# Patient Record
Sex: Male | Born: 1940 | Race: White | Hispanic: No | Marital: Married | Smoking: Former smoker
Health system: Southern US, Community
[De-identification: ages and names within clinical notes are randomized; demographics above are authoritative.]

## PROBLEM LIST (undated history)

## (undated) DIAGNOSIS — N529 Male erectile dysfunction, unspecified: Secondary | ICD-10-CM

## (undated) DIAGNOSIS — C3492 Malignant neoplasm of unspecified part of left bronchus or lung: Secondary | ICD-10-CM

## (undated) DIAGNOSIS — M8580 Other specified disorders of bone density and structure, unspecified site: Secondary | ICD-10-CM

## (undated) DIAGNOSIS — N390 Urinary tract infection, site not specified: Secondary | ICD-10-CM

## (undated) DIAGNOSIS — I10 Essential (primary) hypertension: Secondary | ICD-10-CM

## (undated) DIAGNOSIS — D513 Other dietary vitamin B12 deficiency anemia: Secondary | ICD-10-CM

## (undated) DIAGNOSIS — K219 Gastro-esophageal reflux disease without esophagitis: Secondary | ICD-10-CM

## (undated) DIAGNOSIS — E785 Hyperlipidemia, unspecified: Secondary | ICD-10-CM

## (undated) DIAGNOSIS — E875 Hyperkalemia: Secondary | ICD-10-CM

## (undated) DIAGNOSIS — E559 Vitamin D deficiency, unspecified: Secondary | ICD-10-CM

## (undated) DIAGNOSIS — C61 Malignant neoplasm of prostate: Secondary | ICD-10-CM

## (undated) HISTORY — DX: Malignant neoplasm of unspecified part of left bronchus or lung: C34.92

---

## 1999-04-24 ENCOUNTER — Encounter: Admission: RE | Admit: 1999-04-24 | Discharge: 1999-07-23 | Payer: Self-pay | Admitting: Radiation Oncology

## 1999-07-04 ENCOUNTER — Encounter: Payer: Self-pay | Admitting: Urology

## 1999-07-04 ENCOUNTER — Ambulatory Visit (HOSPITAL_BASED_OUTPATIENT_CLINIC_OR_DEPARTMENT_OTHER): Admission: RE | Admit: 1999-07-04 | Discharge: 1999-07-04 | Payer: Self-pay | Admitting: Urology

## 1999-07-25 ENCOUNTER — Encounter: Payer: Self-pay | Admitting: Radiation Oncology

## 1999-07-25 ENCOUNTER — Encounter: Admission: RE | Admit: 1999-07-25 | Discharge: 1999-10-23 | Payer: Self-pay | Admitting: Radiation Oncology

## 2008-11-23 ENCOUNTER — Encounter: Admission: RE | Admit: 2008-11-23 | Discharge: 2008-12-22 | Payer: Self-pay | Admitting: Physician Assistant

## 2010-05-30 ENCOUNTER — Emergency Department (HOSPITAL_COMMUNITY)
Admission: EM | Admit: 2010-05-30 | Discharge: 2010-05-30 | Disposition: A | Discharge status: Home / Self Care | Payer: Medicare Other | Attending: Emergency Medicine | Admitting: Emergency Medicine

## 2010-05-30 ENCOUNTER — Emergency Department (HOSPITAL_COMMUNITY): Payer: Medicare Other

## 2010-05-30 DIAGNOSIS — I10 Essential (primary) hypertension: Secondary | ICD-10-CM | POA: Insufficient documentation

## 2010-05-30 DIAGNOSIS — M545 Low back pain, unspecified: Secondary | ICD-10-CM | POA: Insufficient documentation

## 2010-05-30 DIAGNOSIS — M25529 Pain in unspecified elbow: Secondary | ICD-10-CM | POA: Insufficient documentation

## 2010-05-30 DIAGNOSIS — S8000XA Contusion of unspecified knee, initial encounter: Secondary | ICD-10-CM | POA: Insufficient documentation

## 2010-05-30 DIAGNOSIS — I2510 Atherosclerotic heart disease of native coronary artery without angina pectoris: Secondary | ICD-10-CM | POA: Insufficient documentation

## 2010-05-30 DIAGNOSIS — M25469 Effusion, unspecified knee: Secondary | ICD-10-CM | POA: Insufficient documentation

## 2010-05-30 DIAGNOSIS — R110 Nausea: Secondary | ICD-10-CM | POA: Insufficient documentation

## 2010-05-30 DIAGNOSIS — Z79899 Other long term (current) drug therapy: Secondary | ICD-10-CM | POA: Insufficient documentation

## 2010-05-30 DIAGNOSIS — M25569 Pain in unspecified knee: Secondary | ICD-10-CM | POA: Insufficient documentation

## 2010-05-30 DIAGNOSIS — M25429 Effusion, unspecified elbow: Secondary | ICD-10-CM | POA: Insufficient documentation

## 2010-05-30 DIAGNOSIS — M79609 Pain in unspecified limb: Secondary | ICD-10-CM | POA: Insufficient documentation

## 2010-05-30 DIAGNOSIS — Y990 Civilian activity done for income or pay: Secondary | ICD-10-CM | POA: Insufficient documentation

## 2010-05-30 DIAGNOSIS — IMO0002 Reserved for concepts with insufficient information to code with codable children: Secondary | ICD-10-CM | POA: Insufficient documentation

## 2010-05-30 DIAGNOSIS — R0602 Shortness of breath: Secondary | ICD-10-CM | POA: Insufficient documentation

## 2010-05-30 DIAGNOSIS — W11XXXA Fall on and from ladder, initial encounter: Secondary | ICD-10-CM | POA: Insufficient documentation

## 2010-05-30 DIAGNOSIS — S5000XA Contusion of unspecified elbow, initial encounter: Secondary | ICD-10-CM | POA: Insufficient documentation

## 2010-05-30 LAB — URINALYSIS, ROUTINE W REFLEX MICROSCOPIC
Bilirubin Urine: NEGATIVE
Nitrite: NEGATIVE
Specific Gravity, Urine: 1.022 (ref 1.005–1.030)
Urobilinogen, UA: 1 mg/dL (ref 0.0–1.0)
pH: 7 (ref 5.0–8.0)

## 2010-06-23 NOTE — Op Note (Signed)
[facility]  Patient:    Jeremy Chan, Jeremy Chan                       MRN: [id number] Proc. Date: 07/04/99 Adm. Date:  [id number] Disc. Date: [id number] Attending:  Nelma Rothman Iii                           Operative Report  PREOPERATIVE DIAGNOSIS:  Adenocarcinoma of the prostate.  POSTOPERATIVE DIAGNOSIS:  Adenocarcinoma of the prostate.  OPERATION PERFORMED:  Transperineal implantation of iodine-125 seeds into the prostate and flexible cystourethroscopy.  SURGEON:  Lucrezia Starch. Ovidio Hanger, M.D.  ASSISTANT:  Wynn Banker, M.D.  ANESTHESIA:  General endotracheal.  ESTIMATED BLOOD LOSS:  15 cc.  TUBES:  16 French Foley catheter.  COMPLICATIONS:  None.  A total of 96 seeds were implanted with 24 needles at 0.429 mCi per seed. Vicryl strands were utilized.  INDICATIONS FOR PROCEDURE:  The patient is a very nice 70 year old white male who presented to Dr. Sherril Croon and was subsequently to have a PSA of 4.61.  He subsequently underwent transrectal ultrasound and biopsy of the prostate which revealed a Gleason score 5 which was 2+3 adenocarcinoma and several areas of high grade TIN.  He has considered all options carefully. He has been properly informed and properly simulated and has elected to proceed with seed implantation.  DESCRIPTION OF PROCEDURE:  The patient was placed in supine position.  After proper general endotracheal anesthesia, he was placed in the dorsal lithotomy position and prepped and draped with Betadine in sterile fashion.  A 16 French Foley catheter was inserted and inflated with 10 cc of contrast solution. Fluoroscopy was placed as was the transrectal ultrasound probe, the Siemens device on the [place] stabilization device.  The wound was draped and holding needles were placed with the physical grid utilizing the electronic grid on the ultrasound to preplanned coordinates.  Proper measurements were taken and serial  implantation of the prostate was performed with Vicryl strands in preplanned coordinates utilizing both ultrasound and fluoroscopic localization.  A total of 96 seeds were implanted with 24 needles at 0.429 mCi per seed and Vicryl strands were utilized.  Following implantation we were comfortable with the localization both with ultrasound and with fluoroscopy. Following this, the holding needles were removed as was the transrectal ultrasound probe.  Static images were obtained fluoroscopically with and without the ultrasound probe in place.  The wound was then dressed sterilely. It should be noted that a 14 French red rubber catheter had been placed into the rectum to vent flatus and this was removed also.  The patient was placed in the supine position and the Foley catheter was removed and scanned for seeds and there were none within it.  Flexible cystourethroscopy was then performed with an Olympus flexible cystourethroscope.  He was noted to have mild trilobar hypertrophy.  Grade 1 trabeculation was noted.  Efflux of clear urine from the normally placed ureteral orifices was noted bilaterally and there were no seeds or Vicryl strands within the bladder or urethra.  The flexible cystourethroscope was visually removed.  A new 16 French catheter was inserted and inflated and the patient was taken to the recovery room stable. DD:  07/04/99 TD:  07/06/99 Job: [id number]

## 2016-07-31 ENCOUNTER — Emergency Department (HOSPITAL_COMMUNITY): Payer: Medicare Other

## 2016-07-31 ENCOUNTER — Encounter (HOSPITAL_COMMUNITY): Payer: Self-pay

## 2016-07-31 ENCOUNTER — Inpatient Hospital Stay (HOSPITAL_COMMUNITY)
Admission: EM | Admit: 2016-07-31 | Discharge: 2016-08-10 | DRG: 166 | Disposition: A | Discharge status: Home Health | Payer: Medicare Other | Attending: Internal Medicine | Admitting: Internal Medicine

## 2016-07-31 ENCOUNTER — Observation Stay (HOSPITAL_COMMUNITY): Payer: Medicare Other

## 2016-07-31 DIAGNOSIS — R079 Chest pain, unspecified: Secondary | ICD-10-CM | POA: Diagnosis not present

## 2016-07-31 DIAGNOSIS — J90 Pleural effusion, not elsewhere classified: Secondary | ICD-10-CM | POA: Diagnosis not present

## 2016-07-31 DIAGNOSIS — J939 Pneumothorax, unspecified: Secondary | ICD-10-CM | POA: Diagnosis not present

## 2016-07-31 DIAGNOSIS — Z8546 Personal history of malignant neoplasm of prostate: Secondary | ICD-10-CM

## 2016-07-31 DIAGNOSIS — Z9889 Other specified postprocedural states: Secondary | ICD-10-CM

## 2016-07-31 DIAGNOSIS — I2510 Atherosclerotic heart disease of native coronary artery without angina pectoris: Secondary | ICD-10-CM | POA: Diagnosis present

## 2016-07-31 DIAGNOSIS — Z8042 Family history of malignant neoplasm of prostate: Secondary | ICD-10-CM

## 2016-07-31 DIAGNOSIS — J910 Malignant pleural effusion: Secondary | ICD-10-CM | POA: Diagnosis present

## 2016-07-31 DIAGNOSIS — K5900 Constipation, unspecified: Secondary | ICD-10-CM | POA: Diagnosis not present

## 2016-07-31 DIAGNOSIS — J189 Pneumonia, unspecified organism: Secondary | ICD-10-CM | POA: Diagnosis present

## 2016-07-31 DIAGNOSIS — D62 Acute posthemorrhagic anemia: Secondary | ICD-10-CM | POA: Diagnosis not present

## 2016-07-31 DIAGNOSIS — E7800 Pure hypercholesterolemia, unspecified: Secondary | ICD-10-CM

## 2016-07-31 DIAGNOSIS — G4733 Obstructive sleep apnea (adult) (pediatric): Secondary | ICD-10-CM | POA: Diagnosis present

## 2016-07-31 DIAGNOSIS — K219 Gastro-esophageal reflux disease without esophagitis: Secondary | ICD-10-CM | POA: Diagnosis present

## 2016-07-31 DIAGNOSIS — J949 Pleural condition, unspecified: Secondary | ICD-10-CM

## 2016-07-31 DIAGNOSIS — I10 Essential (primary) hypertension: Secondary | ICD-10-CM | POA: Diagnosis present

## 2016-07-31 DIAGNOSIS — Z87891 Personal history of nicotine dependence: Secondary | ICD-10-CM

## 2016-07-31 DIAGNOSIS — Z9689 Presence of other specified functional implants: Secondary | ICD-10-CM

## 2016-07-31 DIAGNOSIS — Z955 Presence of coronary angioplasty implant and graft: Secondary | ICD-10-CM

## 2016-07-31 DIAGNOSIS — C3490 Malignant neoplasm of unspecified part of unspecified bronchus or lung: Principal | ICD-10-CM | POA: Diagnosis present

## 2016-07-31 DIAGNOSIS — E785 Hyperlipidemia, unspecified: Secondary | ICD-10-CM | POA: Diagnosis present

## 2016-07-31 DIAGNOSIS — R52 Pain, unspecified: Secondary | ICD-10-CM

## 2016-07-31 HISTORY — DX: Male erectile dysfunction, unspecified: N52.9

## 2016-07-31 HISTORY — DX: Other dietary vitamin B12 deficiency anemia: D51.3

## 2016-07-31 HISTORY — DX: Essential (primary) hypertension: I10

## 2016-07-31 HISTORY — DX: Urinary tract infection, site not specified: N39.0

## 2016-07-31 HISTORY — DX: Hyperkalemia: E87.5

## 2016-07-31 HISTORY — DX: Malignant neoplasm of prostate: C61

## 2016-07-31 HISTORY — DX: Hyperlipidemia, unspecified: E78.5

## 2016-07-31 HISTORY — DX: Gastro-esophageal reflux disease without esophagitis: K21.9

## 2016-07-31 HISTORY — DX: Other specified disorders of bone density and structure, unspecified site: M85.80

## 2016-07-31 HISTORY — DX: Vitamin D deficiency, unspecified: E55.9

## 2016-07-31 LAB — BASIC METABOLIC PANEL
ANION GAP: 9 (ref 5–15)
BUN: 18 mg/dL (ref 6–20)
CALCIUM: 9.2 mg/dL (ref 8.9–10.3)
CO2: 25 mmol/L (ref 22–32)
CREATININE: 0.82 mg/dL (ref 0.61–1.24)
Chloride: 100 mmol/L — ABNORMAL LOW (ref 101–111)
Glucose, Bld: 135 mg/dL — ABNORMAL HIGH (ref 65–99)
Potassium: 3.7 mmol/L (ref 3.5–5.1)
SODIUM: 134 mmol/L — AB (ref 135–145)

## 2016-07-31 LAB — DIFFERENTIAL
BASOS PCT: 0 %
Basophils Absolute: 0 10*3/uL (ref 0.0–0.1)
EOS PCT: 1 %
Eosinophils Absolute: 0.2 10*3/uL (ref 0.0–0.7)
LYMPHS PCT: 16 %
Lymphs Abs: 1.8 10*3/uL (ref 0.7–4.0)
MONO ABS: 1 10*3/uL (ref 0.1–1.0)
Monocytes Relative: 9 %
NEUTROS PCT: 74 %
Neutro Abs: 8.3 10*3/uL — ABNORMAL HIGH (ref 1.7–7.7)

## 2016-07-31 LAB — HEPATIC FUNCTION PANEL
ALT: 12 U/L — ABNORMAL LOW (ref 17–63)
AST: 18 U/L (ref 15–41)
Albumin: 3.8 g/dL (ref 3.5–5.0)
Alkaline Phosphatase: 83 U/L (ref 38–126)
BILIRUBIN DIRECT: 0.2 mg/dL (ref 0.1–0.5)
BILIRUBIN INDIRECT: 0.6 mg/dL (ref 0.3–0.9)
TOTAL PROTEIN: 6.6 g/dL (ref 6.5–8.1)
Total Bilirubin: 0.8 mg/dL (ref 0.3–1.2)

## 2016-07-31 LAB — CBC
HCT: 43.9 % (ref 39.0–52.0)
HEMOGLOBIN: 14.6 g/dL (ref 13.0–17.0)
MCH: 31.6 pg (ref 26.0–34.0)
MCHC: 33.3 g/dL (ref 30.0–36.0)
MCV: 95 fL (ref 78.0–100.0)
PLATELETS: 271 10*3/uL (ref 150–400)
RBC: 4.62 MIL/uL (ref 4.22–5.81)
RDW: 13.7 % (ref 11.5–15.5)
WBC: 12.1 10*3/uL — ABNORMAL HIGH (ref 4.0–10.5)

## 2016-07-31 LAB — I-STAT TROPONIN, ED: TROPONIN I, POC: 0 ng/mL (ref 0.00–0.08)

## 2016-07-31 LAB — PROTIME-INR
INR: 1
Prothrombin Time: 13.2 seconds (ref 11.4–15.2)

## 2016-07-31 MED ORDER — SODIUM CHLORIDE 0.9% FLUSH
3.0000 mL | Freq: Two times a day (BID) | INTRAVENOUS | Status: DC
  Administered 2016-08-01 – 2016-08-10 (×13): 3 mL via INTRAVENOUS

## 2016-07-31 MED ORDER — ONDANSETRON HCL 4 MG PO TABS: 4.0000 mg | ORAL_TABLET | Freq: Four times a day (QID) | ORAL | Status: DC | PRN

## 2016-07-31 MED ORDER — GUAIFENESIN ER 600 MG PO TB12
600.0000 mg | ORAL_TABLET | Freq: Two times a day (BID) | ORAL | Status: DC
  Administered 2016-08-01 – 2016-08-10 (×19): 600 mg via ORAL
  Filled 2016-07-31 (×19): qty 1

## 2016-07-31 MED ORDER — ONDANSETRON HCL 4 MG/2ML IJ SOLN: 4.0000 mg | Freq: Four times a day (QID) | INTRAMUSCULAR | Status: DC | PRN

## 2016-07-31 MED ORDER — BENAZEPRIL HCL 20 MG PO TABS
20.0000 mg | ORAL_TABLET | Freq: Every day | ORAL | Status: DC
  Administered 2016-08-01 – 2016-08-10 (×10): 20 mg via ORAL
  Filled 2016-07-31 (×3): qty 1
  Filled 2016-07-31 (×5): qty 4
  Filled 2016-07-31: qty 1
  Filled 2016-07-31: qty 4
  Filled 2016-07-31: qty 1

## 2016-07-31 MED ORDER — HYDROCODONE-ACETAMINOPHEN 5-325 MG PO TABS
1.0000 | ORAL_TABLET | ORAL | Status: DC | PRN
  Administered 2016-08-01 – 2016-08-02 (×3): 2 via ORAL
  Administered 2016-08-03: 1 via ORAL
  Administered 2016-08-04 – 2016-08-05 (×2): 2 via ORAL
  Administered 2016-08-09: 1 via ORAL
  Administered 2016-08-10: 2 via ORAL
  Filled 2016-07-31 (×8): qty 2

## 2016-07-31 MED ORDER — SODIUM CHLORIDE 0.9% FLUSH: 3.0000 mL | INTRAVENOUS | Status: DC | PRN

## 2016-07-31 MED ORDER — ATORVASTATIN CALCIUM 40 MG PO TABS
40.0000 mg | ORAL_TABLET | Freq: Every day | ORAL | Status: DC
  Administered 2016-08-01 – 2016-08-09 (×8): 40 mg via ORAL
  Filled 2016-07-31 (×8): qty 1

## 2016-07-31 MED ORDER — ALBUTEROL SULFATE (2.5 MG/3ML) 0.083% IN NEBU: 2.5000 mg | INHALATION_SOLUTION | RESPIRATORY_TRACT | Status: DC | PRN

## 2016-07-31 MED ORDER — AMLODIPINE BESYLATE 5 MG PO TABS
5.0000 mg | ORAL_TABLET | Freq: Every day | ORAL | Status: DC
  Administered 2016-08-01 – 2016-08-10 (×10): 5 mg via ORAL
  Filled 2016-07-31 (×10): qty 1

## 2016-07-31 MED ORDER — ACETAMINOPHEN 650 MG RE SUPP: 650.0000 mg | Freq: Four times a day (QID) | RECTAL | Status: DC | PRN

## 2016-07-31 MED ORDER — SODIUM CHLORIDE 0.9 % IV SOLN: 250.0000 mL | INTRAVENOUS | Status: DC | PRN

## 2016-07-31 MED ORDER — SODIUM CHLORIDE 0.9% FLUSH
3.0000 mL | Freq: Two times a day (BID) | INTRAVENOUS | Status: DC
  Administered 2016-08-01 – 2016-08-09 (×11): 3 mL via INTRAVENOUS

## 2016-07-31 MED ORDER — ACETAMINOPHEN 325 MG PO TABS
650.0000 mg | ORAL_TABLET | Freq: Four times a day (QID) | ORAL | Status: DC | PRN
  Filled 2016-07-31 (×2): qty 2

## 2016-07-31 NOTE — ED Provider Notes (Signed)
[facility] DEPT Provider Note   CSN: [id number] Arrival date & time: 07/31/16  1805     History   Chief Complaint Chief Complaint  Patient presents with  . Chest Pain    HPI Jeremy Chan is a 76 y.o. male.  HPI Patient presents with left-sided chest pain and some shortness of breath. Sent from outside hospital for 75% pleural effusion on the left side. Has had shortness of breath over the last few months. Has a chest pain for last 2 days. Sharp on the left chest. Described as a heaviness. No fevers. Has had some sinus congestion. States his been some drainage. No real cough. States he is not bringing anything up. No fevers. Previous history of prostate cancer states he has prostate cancer free now. He does not smoke. No unexplained weight loss. He has a house at [employer]. He states his primary care doctor is there. States he however [place]    Past Medical History:  Diagnosis Date  . Age-related bone loss   . Erectile dysfunction   . GERD (gastroesophageal reflux disease)   . Hyperkalemia   . Hyperlipidemia   . Hypertension   . Other dietary vitamin B12 deficiency anemia   . Prostate cancer ([place])   . Urinary tract infection   . Vitamin D deficiency     Patient Active Problem List   Diagnosis Date Noted  . Pleural effusion 07/31/2016  . Hyperlipidemia 07/31/2016  . Essential hypertension 07/31/2016    History reviewed. No pertinent surgical history.     Home Medications    Prior to Admission medications   Medication Sig Start Date End Date Taking? Authorizing Provider  amLODipine-benazepril (LOTREL) 5-20 MG capsule Take 1 capsule by mouth daily.   Yes [provider]  aspirin 81 MG chewable tablet Chew 81 mg by mouth daily.   Yes [provider]  atorvastatin (LIPITOR) 40 MG tablet Take 40 mg by mouth daily.   Yes [provider]  ibuprofen  (ADVIL,MOTRIN) 200 MG tablet Take 800 mg by mouth every 6 (six) hours as needed.   Yes [provider]    Family History Family History  Problem Relation Age of Onset  . Prostate cancer Father   . Prostate cancer Brother   . Diabetes Other   . CAD Neg Hx   . Stroke Neg Hx     Social History Social History  Substance Use Topics  . Smoking status: Former Smoker    Types: Cigarettes    Quit date: 30  . Smokeless tobacco: Never Used  . Alcohol use Yes     Allergies   Patient has no known allergies.   Review of Systems Review of Systems  Constitutional: Negative for appetite change and fever.  HENT: Negative for dental problem.   Respiratory: Positive for shortness of breath.   Cardiovascular: Positive for chest pain.  Gastrointestinal: Negative for abdominal pain.  Genitourinary: Negative for flank pain.  Musculoskeletal: Positive for back pain.  Neurological: Negative for numbness.  Hematological: Negative for adenopathy.  Psychiatric/Behavioral: Negative for confusion.     Physical Exam Updated Vital Signs BP (!) 120/91 (BP Location: Right Arm)   Pulse 87   Temp 98.3 F (36.8 C) (Oral)   Resp 18   Wt 99.3 kg (219 lb)   SpO2 99%   Physical Exam  Constitutional: He appears well-developed.  HENT:  Head: Atraumatic.  Eyes: Pupils are equal, round, and reactive to light.  Neck: Neck supple.  Cardiovascular: Normal rate.   Pulmonary/Chest: Effort normal.  No respiratory distress. May have decreased breath sounds on the left side.  Abdominal: There is no tenderness.  Musculoskeletal: He exhibits no edema.  Neurological: He is alert.  Skin: Skin is warm. Capillary refill takes less than 2 seconds.  Psychiatric: He has a normal mood and affect.     ED Treatments / Results  Labs (all labs ordered are listed, but only abnormal results are displayed) Labs Reviewed  BASIC METABOLIC PANEL - Abnormal; Notable for the following:       Result Value     Sodium 134 (*)    Chloride 100 (*)    Glucose, Bld 135 (*)    All other components within normal limits  CBC - Abnormal; Notable for the following:    WBC 12.1 (*)    All other components within normal limits  HEPATIC FUNCTION PANEL - Abnormal; Notable for the following:    ALT 12 (*)    All other components within normal limits  DIFFERENTIAL - Abnormal; Notable for the following:    Neutro Abs 8.3 (*)    All other components within normal limits  PROTIME-INR  CBC WITH DIFFERENTIAL/PLATELET  MAGNESIUM  PHOSPHORUS  TSH  COMPREHENSIVE METABOLIC PANEL  CBC  TROPONIN I  TROPONIN I  TROPONIN I  I-STAT TROPOININ, ED    EKG  EKG Interpretation  Date/Time:  Tuesday July 31 2016 18:08:57 EDT Ventricular Rate:  93 PR Interval:  152 QRS Duration: 72 QT Interval:  370 QTC Calculation: 460 R Axis:   84 Text Interpretation:  Normal sinus rhythm Low voltage QRS Nonspecific ST abnormality Abnormal ECG Confirmed by Alvino Chapel  MD, Ovid Curd [phone]) on 07/31/2016 7:41:48 PM       Radiology Dg Chest 2 View  Result Date: 07/31/2016 CLINICAL DATA:  76 year old with 2-3 day history of chest pain, shortness of breath and cough. EXAM: CHEST  2 VIEW COMPARISON:  05/30/2010. FINDINGS: Very large left pleural effusion with residual aeration of only the left lung apex. The large effusion obscures the cardiac silhouette. Right lung clear. No right pleural effusion. Degenerative changes and DISH involving the thoracic spine. IMPRESSION: Very large left pleural effusion and associated passive atelectasis involving the left lung with only the left apex having residual aeration. Right lung clear. Electronically Signed   By: Evangeline Dakin M.D.   On: 07/31/2016 18:49   Dg Chest Right Decubitus  Result Date: 07/31/2016 CLINICAL DATA:  Shortness of breath EXAM: CHEST - RIGHT DECUBITUS COMPARISON:  07/31/2016 FINDINGS: Right side down decubitus view. Large left pleural effusion with small amount of  aerated lung at the left apex, fluid appears mobile given change in location of the aerated lung. Unable to comment on cardiomediastinal silhouette as it is obscured. Fluid level in the left upper quadrant is probably in the stomach IMPRESSION: Large left pleural effusion with minimal aeration of the left lung apex. Electronically Signed   By: Donavan Foil M.D.   On: 07/31/2016 21:54    Procedures Procedures (including critical care time)  Medications Ordered in ED Medications  atorvastatin (LIPITOR) tablet 40 mg (not administered)  amLODipine (NORVASC) tablet 5 mg (not administered)  benazepril (LOTENSIN) tablet 20 mg (not administered)  sodium chloride flush (NS) 0.9 % injection 3 mL (not administered)  acetaminophen (TYLENOL) tablet 650 mg (not administered)    Or  acetaminophen (TYLENOL) suppository 650 mg (not  administered)  HYDROcodone-acetaminophen (NORCO/VICODIN) 5-325 MG per tablet 1-2 tablet (not administered)  ondansetron (ZOFRAN) tablet 4 mg (not administered)    Or  ondansetron (ZOFRAN) injection 4 mg (not administered)  sodium chloride flush (NS) 0.9 % injection 3 mL (not administered)  sodium chloride flush (NS) 0.9 % injection 3 mL (not administered)  0.9 %  sodium chloride infusion (not administered)  albuterol (PROVENTIL) (2.5 MG/3ML) 0.083% nebulizer solution 2.5 mg (not administered)  guaiFENesin (MUCINEX) 12 hr tablet 600 mg (not administered)     Initial Impression / Assessment and Plan / ED Course  I have reviewed the triage vital signs and the nursing notes.  Pertinent labs & imaging results that were available during my care of the patient were reviewed by me and considered in my medical decision making (see chart for details).     Patient with left-sided pleural effusion. Well-appearing. Reportedly did have outside CT scan. Does not follow up in this area. Will admit.  Final Clinical Impressions(s) / ED Diagnoses   Final diagnoses:  Pleural effusion     New Prescriptions Current Discharge Medication List       Davonna Belling, MD 07/31/16 2320

## 2016-07-31 NOTE — ED Notes (Signed)
Pt back from xray. No distress observed

## 2016-07-31 NOTE — ED Triage Notes (Signed)
Pt was sent from [facility] for large left pleural fluid collection filling 75% of the left hemithorax. They recommend a CT scan of the chest. He reports non-radiating chest pain described as heaviness associated with SOB.

## 2016-07-31 NOTE — ED Notes (Signed)
Magda Paganini, ED secretary attempted to call [facility] in [place]. Facility closed. No after-hours phone number provided. Only option provided is to leave a message on provider voicemail.

## 2016-07-31 NOTE — ED Notes (Signed)
[facility] closes at 5 pm.  No after hours number.  Medical Records will have to be obtained during business hours 8 am-5 pm.  The phone number is [phone]

## 2016-07-31 NOTE — ED Notes (Signed)
MD at bedside. 

## 2016-07-31 NOTE — ED Notes (Signed)
Patient transported to X-ray 

## 2016-07-31 NOTE — H&P (Addendum)
Jeremy Chan YFV:[id number] DOB: 1941-01-30 DOA: 07/31/2016     PCP: Haze Rushing, MD   Outpatient Specialists: routine Dermatology visits Patient coming from  home Lives  With family wife   Chief Complaint: large plural effusion  HPI: Jeremy Chan is a 76 y.o. male with medical history significant of OSA, prostate cancer, GERD, HTN, HLD, CAD    Presented with progressive Shortness of breath and occasional chest pain for the past few weeks denies weight loss no fevers or chills. He has been tired no energy, very tired all the time for few months. He reports some cough productive of clear mucus. Lately became dry cough. No hemoptysis.  For the past 1 hear he have had horseness. He was a very heavy smoker in 1980's.  [place] [facility]  Presented with chest pain for the past few days chest x-ray showed large pleural effusion on the left up to 75% he was sent to [facility] where his primary residence is to follow-up.  Patient states that he has received CTA chest at [facility] unfortunately no records of CT results were sent with the patient   Regarding pertinent Chronic problems: hx of Prostate cancer sp  Transperineal implantation of iodine-125 seeds into the prostate in 2012 CAD hx of heart stents done at [place] 15 years ago.   Last colonoscopy was 8 years ago he had some polyps removed.   IN ER:  Temp (24hrs), Avg:97.7 F (36.5 C), Min:97.7 F (36.5 C), Max:97.7 F (36.5 C)      on arrival  ED Triage Vitals  Enc Vitals Group     BP 07/31/16 1813 (!) 144/78     Pulse Rate 07/31/16 1813 90     Resp 07/31/16 1813 20     Temp 07/31/16 1813 97.7 F (36.5 C)     Temp Source 07/31/16 1813 Oral     SpO2 07/31/16 1813 97 %     Weight --      Height --      Head Circumference --      Peak Flow --      Pain Score 07/31/16 1808 7     Pain Loc --      Pain Edu? --      Excl. in [place]? --    RR 18 Hr 77 Bp 131/73 Trop  0.00 Na 134 K 3.7  AG 9 WBC 12.1 Hg 14.6  CXR showing large left plural effusion Following Medications were ordered in ER: Medications - No data to display     Hospitalist was called for admission for work up of symptomatic large left plural effusion  Review of Systems:    Pertinent positives include: fatigue,  shortness of breath at rest.  dyspnea on exertion, chest pain Constitutional:  No weight loss, night sweats, Fevers, chills, weight loss  HEENT:  No headaches, Difficulty swallowing,Tooth/dental problems,Sore throat,  No sneezing, itching, ear ache, nasal congestion, post nasal drip,  Cardio-vascular:  No , Orthopnea, PND, anasarca, dizziness, palpitations.no Bilateral lower extremity swelling  GI:  No heartburn, indigestion, abdominal pain, nausea, vomiting, diarrhea, change in bowel habits, loss of appetite, melena, blood in stool, hematemesis Resp:  no No, No excess mucus, no productive cough, No non-productive cough, No coughing up of blood.No change in color of mucus.No wheezing. Skin:  no rash or lesions. No jaundice GU:  no dysuria, change in color of urine, no urgency or  frequency. No straining to urinate.  No flank pain.  Musculoskeletal:  No joint pain or no joint swelling. No decreased range of motion. No back pain.  Psych:  No change in mood or affect. No depression or anxiety. No memory loss.  Neuro: no localizing neurological complaints, no tingling, no weakness, no double vision, no gait abnormality, no slurred speech, no confusion  As per HPI otherwise 10 point review of systems negative.   Past Medical History: Past Medical History:  Diagnosis Date  . Age-related bone loss   . Erectile dysfunction   . GERD (gastroesophageal reflux disease)   . Hyperkalemia   . Hyperlipidemia   . Hypertension   . Other dietary vitamin B12 deficiency anemia   . Prostate cancer ([place])   . Urinary tract infection   . Vitamin D deficiency    History reviewed.  No pertinent surgical history.   Social History:  Ambulatory   independently      reports that he quit smoking about 32 years ago. His smoking use included Cigarettes. He has never used smokeless tobacco. He reports that he drinks alcohol. He reports that he uses drugs, including Marijuana.  Allergies:  Not on File     Family History:   Family History  Problem Relation Age of Onset  . Prostate cancer Father   . Prostate cancer Brother   . Diabetes Other   . CAD Neg Hx   . Stroke Neg Hx     Medications: Prior to Admission medications   Not on File    Physical Exam: Patient Vitals for the past 24 hrs:  BP Temp Temp src Pulse Resp SpO2  07/31/16 2030 131/73 - - 77 18 -  07/31/16 2000 130/77 - - 80 17 -  07/31/16 1958 - - - - - 97 %  07/31/16 1945 (!) 150/86 - - 87 16 -  07/31/16 1943 (!) 156/83 - - 88 (!) 21 98 %  07/31/16 1813 (!) 144/78 97.7 F (36.5 C) Oral 90 20 97 %    1. General:  in No Acute distress 2. Psychological: Alert and   Oriented 3. Head/ENT:    Dry Mucous Membranes                          Head Non traumatic, neck supple                          Normal   Dentition 4. SKIN:   decreased Skin turgor,  Skin clean Dry and intact no rash 5. Heart: Regular rate and rhythm no Murmur, Rub or gallop 6. Lungs:   no wheezes some crackles  Diminished on the left 7. Abdomen: Soft  non-tender, Non distended 8. Lower extremities: no clubbing, cyanosis, or edema 9. Neurologically Grossly intact, moving all 4 extremities equally   10. MSK: Normal range of motion   body mass index is unknown because there is no height or weight on file.  Labs on Admission:   Labs on Admission: I have personally reviewed following labs and imaging studies  CBC:  Recent Labs Lab 07/31/16 1815  WBC 12.1*  HGB 14.6  HCT 43.9  MCV 95.0  PLT 865   Basic Metabolic Panel:  Recent Labs Lab 07/31/16 1815  NA 134*  K 3.7  CL 100*  CO2 25  GLUCOSE 135*  BUN 18    CREATININE 0.82  CALCIUM 9.2   GFR:  CrCl cannot be calculated (Unknown ideal weight.). Liver Function Tests: No results for input(s): AST, ALT, ALKPHOS, BILITOT, PROT, ALBUMIN in the last 168 hours. No results for input(s): LIPASE, AMYLASE in the last 168 hours. No results for input(s): AMMONIA in the last 168 hours. Coagulation Profile: No results for input(s): INR, PROTIME in the last 168 hours. Cardiac Enzymes: No results for input(s): CKTOTAL, CKMB, CKMBINDEX, TROPONINI in the last 168 hours. BNP (last 3 results) No results for input(s): PROBNP in the last 8760 hours. HbA1C: No results for input(s): HGBA1C in the last 72 hours. CBG: No results for input(s): GLUCAP in the last 168 hours. Lipid Profile: No results for input(s): CHOL, HDL, LDLCALC, TRIG, CHOLHDL, LDLDIRECT in the last 72 hours. Thyroid Function Tests: No results for input(s): TSH, T4TOTAL, FREET4, T3FREE, THYROIDAB in the last 72 hours. Anemia Panel: No results for input(s): VITAMINB12, FOLATE, FERRITIN, TIBC, IRON, RETICCTPCT in the last 72 hours. Urine analysis:  Sepsis Labs: @LABRCNTIP (procalcitonin:4,lacticidven:4) )No results found for this or any previous visit (from the past 240 hour(s)).    UA  not ordered  No results found for: HGBA1C  CrCl cannot be calculated (Unknown ideal weight.).  BNP (last 3 results) No results for input(s): PROBNP in the last 8760 hours.   ECG REPORT  Independently reviewed Rate: 93  Rhythm: NSR ST&T Change: Low voltage flat t waves [id number]  There were no vitals filed for this visit.   Cultures: No results found for: SDES, SPECREQUEST, CULT, REPTSTATUS   Radiological Exams on Admission: Dg Chest 2 View  Result Date: 07/31/2016 CLINICAL DATA:  76 year old with 2-3 day history of chest pain, shortness of breath and cough. EXAM: CHEST  2 VIEW COMPARISON:  05/30/2010. FINDINGS: Very large left pleural effusion with residual aeration of only the left lung apex. The  large effusion obscures the cardiac silhouette. Right lung clear. No right pleural effusion. Degenerative changes and DISH involving the thoracic spine. IMPRESSION: Very large left pleural effusion and associated passive atelectasis involving the left lung with only the left apex having residual aeration. Right lung clear. Electronically Signed   By: Evangeline Dakin M.D.   On: 07/31/2016 18:49    Chart has been reviewed    Assessment/Plan  76 y.o. male with medical history significant of OSA, prostate cancer, GERD, HTN, HLD  Admitted for work up of symptomatic large left plural effusion   Present on Admission: . Pleural effusion - discussed with pulmonology, we'll be able to obtain ultrasonic guided thoracentesis tomorrow. Obtain records from [facility]. Appreciate pulmonology consult . Hyperlipidemia - continue home medication . Essential hypertension - Continue Norvasc Benazapril Chest pain most likely secondary to pleural effusion and known history of coronary artery disease who cycle cardiac enzymes  Leukocytosis no evening so fever this morning by blood cell, was within normal limits we'll continue to follow. Hold off antibiotics until sample of pleural effusion obtained  unless patient becomes febrile.  Other plan as per orders.  DVT prophylaxis:  SCD    Code Status:  FULL CODE   as per patient    Family Communication:   Family   at  Bedside  plan of care was discussed with   Son  Disposition Plan:   To home once workup is complete and patient is stable  Consults called: pulmonology    Admission status: obs   Level of care    tele          I have spent a total of 75 min on this admission   extra time was spent to discuss case with consultants pulmonology and radiology  [place] 07/31/2016, 10:11 PM    [facility]  Pager [phone]   after 2 AM please page floor coverage PA If 7AM-7PM,  please contact the day team taking care of the patient  [email or web address]  Password [email or web address]

## 2016-07-31 NOTE — ED Notes (Signed)
Hospitalist at bedside 

## 2016-07-31 NOTE — Consult Note (Signed)
PULMONARY / CRITICAL CARE MEDICINE   Name: Jeremy Chan MRN: [id number] DOB: Jun 01, 1940    ADMISSION DATE:  07/31/2016 CONSULTATION DATE:  07/31/16  REFERRING MD:Dr Doutova  CHIEF COMPLAINT: Pleural effusion  HISTORY OF PRESENT ILLNESS:   76yoM with history of Prostate cancer, HTN, Anemia, GERD, Hyperlipidemia, and Prior tobacco abuse, presents to the ER tonight c/o SOB and CP. He reports the SOB started several months ago, possibly as many as 6 months ago, but definitely less than 1 year. He reports is has been gradually progressing in severity to the point that now he has SOB with minimal exertion such as walking from his car to his house. He reports chronic cough for the past several months that he thought was related to his rhinorrhea and post-nasal drip, but he says that more recently the coughing worsened in severity but was still nonproductive. He now also c/o left-sided CP over his left breast that began 3 days ago and is "severe sharp" radiating through to his back. He was unable to quantify the pain severity numerically, just stating "severe." He denies F/C, Sick contacts, Weight loss, Falls/Trauma. 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 He denies being on any blood thinners.   PAST MEDICAL HISTORY :  He  has a past medical history of Age-related bone loss; Erectile dysfunction; GERD (gastroesophageal reflux disease); Hyperkalemia; Hyperlipidemia; Hypertension; Other dietary vitamin B12 deficiency anemia; Prostate cancer ([place]); Urinary tract infection; and Vitamin D deficiency.  PAST SURGICAL HISTORY: He  has no past  surgical history on file.  No Known Allergies  No current facility-administered medications on file prior to encounter.    No current outpatient prescriptions on file prior to encounter.    FAMILY HISTORY:  His indicated that his mother is deceased. He indicated that his father is deceased. He indicated that the status of his brother is unknown. He indicated that the status of his neg hx is unknown. He indicated that the status of his other is unknown.   SOCIAL HISTORY: He  reports that he quit smoking about 32 years ago. His smoking use included Cigarettes. He has never used smokeless tobacco. He reports that he drinks alcohol. He reports that he uses drugs, including Marijuana.  REVIEW OF SYSTEMS:   Review of Systems  Constitutional: Positive for malaise/fatigue.  HENT: Negative.   Eyes: Negative.   Respiratory: Positive for cough and shortness of breath.   Cardiovascular: Positive for chest pain.  Gastrointestinal: Negative.   Genitourinary: Negative.   Musculoskeletal: Negative.   Skin: Negative.   Neurological: Negative.   Endo/Heme/Allergies: Negative.   Psychiatric/Behavioral: Negative.   All other systems reviewed and are negative.   VITAL SIGNS: BP 136/78   Pulse 74   Temp 97.7 F (36.5 C) (Oral)   Resp 14   SpO2 96%      INTAKE / OUTPUT: No intake/output data recorded.  PHYSICAL EXAMINATION: General:  WDWN Elderly WM in NAD, on RA Neuro: AAOx3, moving all extremities. HEENT: PERRL, EOMI, MM moist, OP clear Cardiovascular: RRR, no m/r/g Lungs: Severely decreased breath sounds on left; CTA on right. Speaking in full sentences with no  accessory muscle use or respiratory distress Abdomen: Soft obese NTND Musculoskeletal: no clubbing, cyanosis, or edema Skin: no rashes  LABS:  BMET  Recent Labs Lab 07/31/16 1815  NA 134*  K 3.7  CL 100*  CO2 25  BUN 18  CREATININE 0.82  GLUCOSE 135*    Electrolytes  Recent Labs Lab 07/31/16 1815   CALCIUM 9.2    CBC  Recent Labs Lab 07/31/16 1815  WBC 12.1*  HGB 14.6  HCT 43.9  PLT 271    Coag's  Recent Labs Lab 07/31/16 2122  INR 1.00    Sepsis Markers No results for input(s): LATICACIDVEN, PROCALCITON, O2SATVEN in the last 168 hours.  ABG No results for input(s): PHART, PCO2ART, PO2ART in the last 168 hours.  Liver Enzymes  Recent Labs Lab 07/31/16 1815  AST 18  ALT 12*  ALKPHOS 83  BILITOT 0.8  ALBUMIN 3.8    Cardiac Enzymes No results for input(s): TROPONINI, PROBNP in the last 168 hours.  Glucose No results for input(s): GLUCAP in the last 168 hours.  Imaging Dg Chest 2 View  Result Date: 07/31/2016 CLINICAL DATA:  76 year old with 2-3 day history of chest pain, shortness of breath and cough. EXAM: CHEST  2 VIEW COMPARISON:  05/30/2010. FINDINGS: Very large left pleural effusion with residual aeration of only the left lung apex. The large effusion obscures the cardiac silhouette. Right lung clear. No right pleural effusion. Degenerative changes and DISH involving the thoracic spine. IMPRESSION: Very large left pleural effusion and associated passive atelectasis involving the left lung with only the left apex having residual aeration. Right lung clear. Electronically Signed   By: Evangeline Dakin M.D.   On: 07/31/2016 18:49   Dg Chest Right Decubitus  Result Date: 07/31/2016 CLINICAL DATA:  Shortness of breath EXAM: CHEST - RIGHT DECUBITUS COMPARISON:  07/31/2016 FINDINGS: Right side down decubitus view. Large left pleural effusion with small amount of aerated lung at the left apex, fluid appears mobile given change in location of the aerated lung. Unable to comment on cardiomediastinal silhouette as it is obscured. Fluid level in the left upper quadrant is probably in the stomach IMPRESSION: Large left pleural effusion with minimal aeration of the left lung apex. Electronically Signed   By: Donavan Foil M.D.   On: 07/31/2016 21:54    LINES/TUBES: PIV's   ASSESSMENT / PLAN: 76yoM with history of Prostate cancer, HTN, Anemia, GERD, Hyperlipidemia, and Prior tobacco abuse, presents to the ER tonight c/o SOB, Fatigue, Nonproductive cough for the past several months, and now Left-sided CP for the past 3 days, found to have large left-sided pleural effusion.      PULMONARY 1. Large Left-sided Pleural effusion: - on my review of his CXR, there is a large left-sided pleural effusion occupying approximately 90% of the left hemi-thorax.  - patient also reports he had a CTA chest approx 5 weeks ago at a different facility, but I do not have those images for review at this time. - The sub-acute presentation of his SOB and Fatigue is concerning that this effusion may be malignant in nature. Less likely infectious. Much less likely due to renal insufficiency or CHF given its unilateral nature. Unlikely hemothorax as he is not on any anticoagulation and denies any history of trauma. - will plan on thoracentesis tonight with labs to be sent to micro and pathology.   CARDIOVASCULAR 1. HTN: -  Continue home antihypertensives  RENAL No active issues   GASTROINTESTINAL No active issues  HEMATOLOGIC 1. Anemia: history of anemia but Hgb 14.6 at this time. - continue to monitor  2. History of Prostate cancer:  - reportedly treated by seed radiation many years ago with PSA normal on yearly exams since then.   INFECTIOUS No acute issues  ENDOCRINE No acute issues   NEUROLOGIC No acute issues    FAMILY  - Updates: adult son at bedside and is updated.   Vernie Murders, MD Pulmonary and [place] Pager: [phone]  07/31/2016, 10:45 PM

## 2016-08-01 DIAGNOSIS — D62 Acute posthemorrhagic anemia: Secondary | ICD-10-CM | POA: Diagnosis not present

## 2016-08-01 DIAGNOSIS — R079 Chest pain, unspecified: Secondary | ICD-10-CM | POA: Diagnosis not present

## 2016-08-01 DIAGNOSIS — Z955 Presence of coronary angioplasty implant and graft: Secondary | ICD-10-CM | POA: Diagnosis not present

## 2016-08-01 DIAGNOSIS — Z87891 Personal history of nicotine dependence: Secondary | ICD-10-CM | POA: Diagnosis not present

## 2016-08-01 DIAGNOSIS — Z9889 Other specified postprocedural states: Secondary | ICD-10-CM | POA: Diagnosis not present

## 2016-08-01 DIAGNOSIS — J189 Pneumonia, unspecified organism: Secondary | ICD-10-CM | POA: Diagnosis present

## 2016-08-01 DIAGNOSIS — G4733 Obstructive sleep apnea (adult) (pediatric): Secondary | ICD-10-CM | POA: Diagnosis present

## 2016-08-01 DIAGNOSIS — J181 Lobar pneumonia, unspecified organism: Secondary | ICD-10-CM | POA: Diagnosis not present

## 2016-08-01 DIAGNOSIS — E785 Hyperlipidemia, unspecified: Secondary | ICD-10-CM | POA: Diagnosis present

## 2016-08-01 DIAGNOSIS — I2510 Atherosclerotic heart disease of native coronary artery without angina pectoris: Secondary | ICD-10-CM | POA: Diagnosis present

## 2016-08-01 DIAGNOSIS — J90 Pleural effusion, not elsewhere classified: Secondary | ICD-10-CM | POA: Diagnosis present

## 2016-08-01 DIAGNOSIS — C3492 Malignant neoplasm of unspecified part of left bronchus or lung: Secondary | ICD-10-CM | POA: Diagnosis present

## 2016-08-01 DIAGNOSIS — J910 Malignant pleural effusion: Secondary | ICD-10-CM | POA: Diagnosis present

## 2016-08-01 DIAGNOSIS — I10 Essential (primary) hypertension: Secondary | ICD-10-CM | POA: Diagnosis present

## 2016-08-01 DIAGNOSIS — E7800 Pure hypercholesterolemia, unspecified: Secondary | ICD-10-CM | POA: Diagnosis not present

## 2016-08-01 DIAGNOSIS — Z9689 Presence of other specified functional implants: Secondary | ICD-10-CM | POA: Diagnosis not present

## 2016-08-01 DIAGNOSIS — K219 Gastro-esophageal reflux disease without esophagitis: Secondary | ICD-10-CM | POA: Diagnosis present

## 2016-08-01 DIAGNOSIS — K5900 Constipation, unspecified: Secondary | ICD-10-CM | POA: Diagnosis not present

## 2016-08-01 DIAGNOSIS — J939 Pneumothorax, unspecified: Secondary | ICD-10-CM | POA: Diagnosis not present

## 2016-08-01 DIAGNOSIS — Z8042 Family history of malignant neoplasm of prostate: Secondary | ICD-10-CM | POA: Diagnosis not present

## 2016-08-01 DIAGNOSIS — Z8546 Personal history of malignant neoplasm of prostate: Secondary | ICD-10-CM | POA: Diagnosis not present

## 2016-08-01 DIAGNOSIS — C3490 Malignant neoplasm of unspecified part of unspecified bronchus or lung: Secondary | ICD-10-CM | POA: Diagnosis present

## 2016-08-01 LAB — COMPREHENSIVE METABOLIC PANEL
ALT: 10 U/L — ABNORMAL LOW (ref 17–63)
ANION GAP: 6 (ref 5–15)
AST: 14 U/L — AB (ref 15–41)
Albumin: 3.1 g/dL — ABNORMAL LOW (ref 3.5–5.0)
Alkaline Phosphatase: 77 U/L (ref 38–126)
BUN: 16 mg/dL (ref 6–20)
CO2: 27 mmol/L (ref 22–32)
Calcium: 8.7 mg/dL — ABNORMAL LOW (ref 8.9–10.3)
Chloride: 104 mmol/L (ref 101–111)
Creatinine, Ser: 0.68 mg/dL (ref 0.61–1.24)
Glucose, Bld: 101 mg/dL — ABNORMAL HIGH (ref 65–99)
POTASSIUM: 4 mmol/L (ref 3.5–5.1)
Sodium: 137 mmol/L (ref 135–145)
TOTAL PROTEIN: 5.7 g/dL — AB (ref 6.5–8.1)
Total Bilirubin: 0.9 mg/dL (ref 0.3–1.2)

## 2016-08-01 LAB — CBC
HEMATOCRIT: 43.2 % (ref 39.0–52.0)
Hemoglobin: 13.9 g/dL (ref 13.0–17.0)
MCH: 30.5 pg (ref 26.0–34.0)
MCHC: 32.2 g/dL (ref 30.0–36.0)
MCV: 94.9 fL (ref 78.0–100.0)
Platelets: 247 10*3/uL (ref 150–400)
RBC: 4.55 MIL/uL (ref 4.22–5.81)
RDW: 13.8 % (ref 11.5–15.5)
WBC: 10.6 10*3/uL — AB (ref 4.0–10.5)

## 2016-08-01 LAB — ALBUMIN, PLEURAL OR PERITONEAL FLUID: ALBUMIN FL: 3 g/dL

## 2016-08-01 LAB — GLUCOSE, PLEURAL OR PERITONEAL FLUID: Glucose, Fluid: 102 mg/dL

## 2016-08-01 LAB — MAGNESIUM: MAGNESIUM: 2 mg/dL (ref 1.7–2.4)

## 2016-08-01 LAB — BODY FLUID CELL COUNT WITH DIFFERENTIAL
EOS FL: 1 %
LYMPHS FL: 53 %
MONOCYTE-MACROPHAGE-SEROUS FLUID: 20 % — AB (ref 50–90)
Neutrophil Count, Fluid: 26 % — ABNORMAL HIGH (ref 0–25)
WBC FLUID: 1410 uL — AB (ref 0–1000)

## 2016-08-01 LAB — ALBUMIN: Albumin: 3.6 g/dL (ref 3.5–5.0)

## 2016-08-01 LAB — PROTEIN, PLEURAL OR PERITONEAL FLUID: Total protein, fluid: 4.4 g/dL

## 2016-08-01 LAB — TROPONIN I: Troponin I: <0.03 ng/mL (ref <0.03)

## 2016-08-01 LAB — TSH: TSH: 4.746 u[IU]/mL — AB (ref 0.350–4.500)

## 2016-08-01 LAB — T4, FREE: FREE T4: 1.05 ng/dL (ref 0.61–1.12)

## 2016-08-01 LAB — LACTATE DEHYDROGENASE: LDH: 231 U/L — AB (ref 98–192)

## 2016-08-01 LAB — AMYLASE, PLEURAL OR PERITONEAL FLUID: AMYLASE FL: 25 U/L

## 2016-08-01 LAB — PHOSPHORUS: PHOSPHORUS: 3.1 mg/dL (ref 2.5–4.6)

## 2016-08-01 LAB — GLUCOSE, CAPILLARY: Glucose-Capillary: 102 mg/dL — ABNORMAL HIGH (ref 65–99)

## 2016-08-01 LAB — LACTATE DEHYDROGENASE, PLEURAL OR PERITONEAL FLUID: LD, Fluid: 371 U/L — ABNORMAL HIGH (ref 3–23)

## 2016-08-01 MED ORDER — KETOROLAC TROMETHAMINE 30 MG/ML IJ SOLN
30.0000 mg | Freq: Four times a day (QID) | INTRAMUSCULAR | Status: AC | PRN
  Administered 2016-08-01: 30 mg via INTRAVENOUS
  Filled 2016-08-01: qty 1

## 2016-08-01 NOTE — Care Management Obs Status (Signed)
[facility] NOTIFICATION   Patient Details  Name: Jeremy Chan MRN: [id number] Date of Birth: 03/24/1940   Medicare Observation Status Notification Given:  Yes (pt declined to sign)    Dawayne Patricia, RN 08/01/2016, 5:24 PM

## 2016-08-01 NOTE — Procedures (Signed)
Thoracentesis Procedure Note  Pre-operative Diagnosis: Large left-sided pleural effusion  Post-operative Diagnosis: Same  Indications: Diagnostic and Therapeutic  Procedure Details  Consent: Informed consent was obtained. Risks of the procedure were discussed including: infection, bleeding, pain, pneumothorax.  Under sterile conditions the patient was positioned. Chloropreps and sterile drapes were utilized.  10cc of 1% Lidocaine was used locally for anesthesia. Fluid was obtained without any difficulties and minimal blood loss.  A dressing was applied to the wound and wound care instructions were provided.   Findings 2100cc of serosanguinous dark orange blood tinged pleural fluid was obtained. A sample was sent to Pathology for cytogenetics, flow, and cell counts, as well as for infection analysis.  Complications: None; patient tolerated the procedure well        Condition: Stable   Plan A follow up chest x-ray was ordered. Bed Rest for 1 hour. Tylenol 650 mg. for pain.  Attending Attestation:  Vernie Murders, MD Pulmonary & Critical Care Medicine

## 2016-08-01 NOTE — Progress Notes (Signed)
PROGRESS NOTE    Jeremy Chan  MVE:[id number] DOB: [birth year] DOA: 07/31/2016 PCP: Haze Rushing, MD    Brief Narrative:  Patient is 76 [place] and history of obstructive sleep apnea, prostate cancer, hypertension, hyperlipidemia, coronary artery disease presented to the ED with progressive worsening shortness of breath and occasional chest pain for the past few weeks. Patient noted to have a large left-sided pleural effusion. Pulmonary/critical care consulted and patient underwent a thoracentesis. Results pending.   Assessment & Plan:   Principal Problem:   Pleural effusion Active Problems:   Hyperlipidemia   Essential hypertension   Chest pain   #1 large left pleural effusion Questionable etiology. Patient status post diagnostic and therapeutic paracentesis per pulmonary with 2.1 L of serosanguineous dark orange blood-tinged pleural fluid obtained. Fluid cultures pending. By light's criteria likely an exudate. Cytology pending. No need for antibiotics at this time. Pulmonary following and appreciate input and recommendations.  #2 hypertension Stable. Continue home regimen of Norvasc, benazepril.  #3 hyperlipidemia Continue statin.  #4 chest pain Likely secondary to problem #1. Cardiac enzymes negative 3. Medical improvement.   DVT prophylaxis: SCDs Code Status: Full Family Communication: Updated patient and son at bedside. Disposition Plan: Home when clinically improved and per pulmonary and once results of thoracentesis are completed.   Consultants:   PCCM/pulmonary 07/31/2016 Dr. Jimmey Ralph  Procedures:   Diagnostic and therapeutic thoracentesis per Dr. Jimmey Ralph 08/01/2016  Chest x-ray 08/01/2016, 07/31/2016    Antimicrobials:   None   Subjective: Patient sitting up in bed eating. Patient states improvement with shortness of breath. Patient denies any chest pain.  Objective: Vitals:   08/01/16 0439 08/01/16 0901 08/01/16 1420 08/01/16 2006  BP:  126/68 117/63 130/72 129/64  Pulse: 78 81 80 85  Resp: 18 18 20 18   Temp: 98.4 F (36.9 C) 97.8 F (36.6 C) 98.3 F (36.8 C) 98.9 F (37.2 C)  TempSrc: Oral Oral Oral Oral  SpO2: 94% 94% 96% 97%  Weight: 96.6 kg (212 lb 14.4 oz)       Intake/Output Summary (Last 24 hours) at 08/01/16 2131 Last data filed at 08/01/16 1756  Gross per 24 hour  Intake              720 ml  Output              120 ml  Net              600 ml   Filed Weights   07/31/16 2300 08/01/16 0439  Weight: 99.3 kg (219 lb) 96.6 kg (212 lb 14.4 oz)    Examination:  General exam: Appears calm and comfortable  Respiratory system: Decreased breath sounds in the left base up to a third of the lungs. No wheezing.  Respiratory effort normal. Cardiovascular system: S1 & S2 heard, RRR. No JVD, murmurs, rubs, gallops or clicks. No pedal edema. Gastrointestinal system: Abdomen is nondistended, soft and nontender. No organomegaly or masses felt. Normal bowel sounds heard. Central nervous system: Alert and oriented. No focal neurological deficits. Extremities: Symmetric 5 x 5 power. Skin: No rashes, lesions or ulcers Psychiatry: Judgement and insight appear normal. Mood & affect appropriate.     Data Reviewed: I have personally reviewed following labs and imaging studies  CBC:  Recent Labs Lab 07/31/16 1815 08/01/16 0604  WBC 12.1* 10.6*  NEUTROABS 8.3*  --   HGB 14.6 13.9  HCT 43.9 43.2  MCV 95.0 94.9  PLT 271 283   Basic Metabolic Panel:  Recent Labs Lab 07/31/16 1815 08/01/16 0604  NA 134* 137  K 3.7 4.0  CL 100* 104  CO2 25 27  GLUCOSE 135* 101*  BUN 18 16  CREATININE 0.82 0.68  CALCIUM 9.2 8.7*  MG  --  2.0  PHOS  --  3.1   GFR: CrCl cannot be calculated (Unknown ideal weight.). Liver Function Tests:  Recent Labs Lab 07/31/16 1815 08/01/16 0006 08/01/16 0604  AST 18  --  14*  ALT 12*  --  10*  ALKPHOS 83  --  77  BILITOT 0.8  --  0.9  PROT 6.6  --  5.7*  ALBUMIN 3.8 3.6 3.1*    No results for input(s): LIPASE, AMYLASE in the last 168 hours. No results for input(s): AMMONIA in the last 168 hours. Coagulation Profile:  Recent Labs Lab 07/31/16 2122  INR 1.00   Cardiac Enzymes:  Recent Labs Lab 08/01/16 0006 08/01/16 0604 08/01/16 1054  TROPONINI <0.03 <0.03 <0.03   BNP (last 3 results) No results for input(s): PROBNP in the last 8760 hours. HbA1C: No results for input(s): HGBA1C in the last 72 hours. CBG:  Recent Labs Lab 08/01/16 0629  GLUCAP 102*   Lipid Profile: No results for input(s): CHOL, HDL, LDLCALC, TRIG, CHOLHDL, LDLDIRECT in the last 72 hours. Thyroid Function Tests:  Recent Labs  08/01/16 0006 08/01/16 0604  TSH 4.746*  --   FREET4  --  1.05   Anemia Panel: No results for input(s): VITAMINB12, FOLATE, FERRITIN, TIBC, IRON, RETICCTPCT in the last 72 hours. Sepsis Labs: No results for input(s): PROCALCITON, LATICACIDVEN in the last 168 hours.  No results found for this or any previous visit (from the past 240 hour(s)).       Radiology Studies: Dg Chest 2 View  Result Date: 07/31/2016 CLINICAL DATA:  76 year old with 2-3 day history of chest pain, shortness of breath and cough. EXAM: CHEST  2 VIEW COMPARISON:  05/30/2010. FINDINGS: Very large left pleural effusion with residual aeration of only the left lung apex. The large effusion obscures the cardiac silhouette. Right lung clear. No right pleural effusion. Degenerative changes and DISH involving the thoracic spine. IMPRESSION: Very large left pleural effusion and associated passive atelectasis involving the left lung with only the left apex having residual aeration. Right lung clear. Electronically Signed   By: Evangeline Dakin M.D.   On: 07/31/2016 18:49   Dg Chest Right Decubitus  Result Date: 07/31/2016 CLINICAL DATA:  Shortness of breath EXAM: CHEST - RIGHT DECUBITUS COMPARISON:  07/31/2016 FINDINGS: Right side down decubitus view. Large left pleural effusion with  small amount of aerated lung at the left apex, fluid appears mobile given change in location of the aerated lung. Unable to comment on cardiomediastinal silhouette as it is obscured. Fluid level in the left upper quadrant is probably in the stomach IMPRESSION: Large left pleural effusion with minimal aeration of the left lung apex. Electronically Signed   By: Donavan Foil M.D.   On: 07/31/2016 21:54   Dg Chest Port 1 View  Result Date: 08/01/2016 CLINICAL DATA:  Post thoracentesis EXAM: PORTABLE CHEST 1 VIEW COMPARISON:  07/31/2016 FINDINGS: Erect portable view chest demonstrates clear right lung. Decreased left pleural effusion with moderate residual pleural fluid noted. No pneumothorax. Improved aeration of the left upper lobe. Dense consolidation in the lingula and left base. Enlarged cardiomediastinal silhouette IMPRESSION: 1. Decreased left pleural effusion with no pneumothorax 2. Moderate residual left pleural effusion. Dense atelectasis or pneumonia in the lingula  and left base. Electronically Signed   By: Donavan Foil M.D.   On: 08/01/2016 00:53        Scheduled Meds: . amLODipine  5 mg Oral Daily  . atorvastatin  40 mg Oral q1800  . benazepril  20 mg Oral Daily  . guaiFENesin  600 mg Oral BID  . sodium chloride flush  3 mL Intravenous Q12H  . sodium chloride flush  3 mL Intravenous Q12H   Continuous Infusions: . sodium chloride       LOS: 0 days    Time spent: 35 minutes    Zoei Amison, MD [facility] Pager [phone]  If 7PM-7AM, please contact night-coverage [email or web address] Password [facility] 08/01/2016, 9:31 PM

## 2016-08-02 ENCOUNTER — Inpatient Hospital Stay (HOSPITAL_COMMUNITY): Payer: Medicare Other

## 2016-08-02 DIAGNOSIS — E785 Hyperlipidemia, unspecified: Secondary | ICD-10-CM

## 2016-08-02 DIAGNOSIS — J90 Pleural effusion, not elsewhere classified: Secondary | ICD-10-CM

## 2016-08-02 DIAGNOSIS — Z9889 Other specified postprocedural states: Secondary | ICD-10-CM

## 2016-08-02 LAB — COMPREHENSIVE METABOLIC PANEL
ALT: 11 U/L — ABNORMAL LOW (ref 17–63)
AST: 15 U/L (ref 15–41)
Albumin: 2.8 g/dL — ABNORMAL LOW (ref 3.5–5.0)
Alkaline Phosphatase: 73 U/L (ref 38–126)
Anion gap: 7 (ref 5–15)
BILIRUBIN TOTAL: 1.1 mg/dL (ref 0.3–1.2)
BUN: 12 mg/dL (ref 6–20)
CHLORIDE: 100 mmol/L — AB (ref 101–111)
CO2: 28 mmol/L (ref 22–32)
Calcium: 8.5 mg/dL — ABNORMAL LOW (ref 8.9–10.3)
Creatinine, Ser: 0.75 mg/dL (ref 0.61–1.24)
Glucose, Bld: 118 mg/dL — ABNORMAL HIGH (ref 65–99)
POTASSIUM: 4.1 mmol/L (ref 3.5–5.1)
Sodium: 135 mmol/L (ref 135–145)
TOTAL PROTEIN: 5.3 g/dL — AB (ref 6.5–8.1)

## 2016-08-02 LAB — TRIGLYCERIDES, BODY FLUIDS: Triglycerides, Fluid: 39 mg/dL

## 2016-08-02 LAB — CBC WITH DIFFERENTIAL/PLATELET
BASOS ABS: 0 10*3/uL (ref 0.0–0.1)
Basophils Relative: 0 %
EOS ABS: 0.3 10*3/uL (ref 0.0–0.7)
EOS PCT: 2 %
HCT: 43.3 % (ref 39.0–52.0)
HEMOGLOBIN: 14 g/dL (ref 13.0–17.0)
LYMPHS ABS: 1.4 10*3/uL (ref 0.7–4.0)
LYMPHS PCT: 10 %
MCH: 30.5 pg (ref 26.0–34.0)
MCHC: 32.3 g/dL (ref 30.0–36.0)
MCV: 94.3 fL (ref 78.0–100.0)
Monocytes Absolute: 2.1 10*3/uL — ABNORMAL HIGH (ref 0.1–1.0)
Monocytes Relative: 15 %
NEUTROS PCT: 73 %
Neutro Abs: 10.3 10*3/uL — ABNORMAL HIGH (ref 1.7–7.7)
PLATELETS: 229 10*3/uL (ref 150–400)
RBC: 4.59 MIL/uL (ref 4.22–5.81)
RDW: 13.6 % (ref 11.5–15.5)
WBC: 14.1 10*3/uL — AB (ref 4.0–10.5)

## 2016-08-02 LAB — PH, BODY FLUID: pH, Body Fluid: 7.8

## 2016-08-02 LAB — T3: T3, Total: 114 ng/dL (ref 71–180)

## 2016-08-02 MED ORDER — POLYETHYLENE GLYCOL 3350 17 G PO PACK: 17.0000 g | PACK | Freq: Every day | ORAL | Status: DC | PRN

## 2016-08-02 MED ORDER — SENNOSIDES-DOCUSATE SODIUM 8.6-50 MG PO TABS
1.0000 | ORAL_TABLET | Freq: Two times a day (BID) | ORAL | Status: DC
  Administered 2016-08-02 – 2016-08-07 (×10): 1 via ORAL
  Filled 2016-08-02 (×11): qty 1

## 2016-08-02 NOTE — Procedures (Signed)
Thoracentesis Procedure Note  Pre-operative Diagnosis: left pleural effusion  Post-operative Diagnosis: same  Indications: left pleural effusion  Procedure Details  Consent: Informed consent was obtained. Risks of the procedure were discussed including: infection, bleeding, pain, pneumothorax.  Under sterile conditions the patient was positioned. Betadine solution and sterile drapes were utilized.  2% buffered lidocaine was used to anesthetize the left 9th rib space. Fluid was obtained without any difficulties and minimal blood loss.  A dressing was applied to the wound and wound care instructions were provided.   Findings 600 ml of bloody pleural fluid was obtained. The entire bag was sent to Pathology for cytology  Complications:  None; patient tolerated the procedure well.          Condition: stable  Plan A follow up chest x-ray was ordered. Bed Rest for 2 hours. Tylenol 650 mg. for pain.  Attending Attestation: I performed the procedure.  Rigoberto Noel MD

## 2016-08-02 NOTE — Progress Notes (Signed)
Name: Jeremy Chan MRN: [id number] DOB: May 26, 1940    ADMISSION DATE:  07/31/2016 CONSULTATION DATE:  07/31/16  REFERRING MD :  Roel Cluck  CHIEF COMPLAINT:  Pleural Effusion   HISTORY OF PRESENT ILLNESS:  Jeremy Chan is a 76 y.o. male with a PMH of Prostate cancer (s/p seed implants ~2002), HTN, Anemia, GERD, Hyperlipidemia, and Prior tobacco abuse, presents to the ER tonight c/o SOB and CP. He reports the SOB started several months ago, possibly as many as 6 months ago, but definitely less than 1 year. He reports is has been gradually progressing in severity to the point that now he has SOB with minimal exertion such as walking from his car to his house. He reports chronic cough for the past several months that he thought was related to his rhinorrhea and post-nasal drip, but he says that more recently the coughing worsened in severity but was still nonproductive. He now also c/o left-sided CP over his left breast that began 3 days ago and is "severe sharp" radiating through to his back. He was unable to quantify the pain severity numerically, just stating "severe." He denies F/C, Sick contacts, Weight loss, Falls/Trauma. 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 He denies being on any blood thinners.    Underwent left thoracentesis evening of 6/26 > 2.1L dark orange blood tinged fluid obtained.  Exudative per lights.  Cytology pending.   SUBJECTIVE:  No acute events.  Breathing improved.  VITAL SIGNS: Temp:  [98.1 F (36.7 C)-98.9 F (37.2 C)] 98.1 F (36.7 C) (06/28 0345) Pulse Rate:  [75-85] 75 (06/28 0345) Resp:  [18-20] 18  (06/28 0345) BP: (124-130)/(61-72) 124/61 (06/28 0345) SpO2:  [94 %-97 %] 94 % (06/28 0345) Weight:  [97.1 kg (214 lb 1.1 oz)] 97.1 kg (214 lb 1.1 oz) (06/28 0345)  PHYSICAL EXAMINATION: General: Adult male, resting in bed, in NAD. Neuro: A&O x 3, non-focal.  HEENT: [place]/AT. PERRL, sclerae anicteric. Cardiovascular: RRR, no M/R/G.  Lungs: Respirations even and unlabored.  CTA on right, diminished left base. Abdomen: BS x 4, soft, NT/ND.  Musculoskeletal: No gross deformities, no edema.  Skin: Intact, warm, no rashes.   Recent Labs Lab 07/31/16 1815 08/01/16 0604 08/02/16 0315  NA 134* 137 135  K 3.7 4.0 4.1  CL 100* 104 100*  CO2 25 27 28   BUN 18 16 12   CREATININE 0.82 0.68 0.75  GLUCOSE 135* 101* 118*    Recent Labs Lab 07/31/16 1815 08/01/16 0604 08/02/16 0315  HGB 14.6 13.9 14.0  HCT 43.9 43.2 43.3  WBC 12.1* 10.6* 14.1*  PLT 271 247 229   Dg Chest 2 View  Result Date: 08/02/2016 CLINICAL DATA:  Follow-up pleural effusion EXAM: CHEST  2 VIEW COMPARISON:  Chest radiograph from one day prior. FINDINGS: Stable cardiomediastinal silhouette with normal heart size. No pneumothorax. Moderate to large left pleural effusion, slightly decreased. No right pleural effusion. Stable left lung base consolidation. No new focal consolidative airspace disease. IMPRESSION: 1. Moderate to large left pleural effusion, slightly decreased . 2. Stable left lung base consolidation, favor atelectasis, recommend follow-up to resolution. Electronically Signed   By:  Ilona Sorrel M.D.   On: 08/02/2016 07:04   Dg Chest 2 View  Result Date: 07/31/2016 CLINICAL DATA:  76 year old with 2-3 day history of chest pain, shortness of breath and cough. EXAM: CHEST  2 VIEW COMPARISON:  05/30/2010. FINDINGS: Very large left pleural effusion with residual aeration of only the left lung apex. The large effusion obscures the cardiac silhouette. Right lung clear. No right pleural effusion. Degenerative changes and  DISH involving the thoracic spine. IMPRESSION: Very large left pleural effusion and associated passive atelectasis involving the left lung with only the left apex having residual aeration. Right lung clear. Electronically Signed   By: Evangeline Dakin M.D.   On: 07/31/2016 18:49   Dg Chest Right Decubitus  Result Date: 07/31/2016 CLINICAL DATA:  Shortness of breath EXAM: CHEST - RIGHT DECUBITUS COMPARISON:  07/31/2016 FINDINGS: Right side down decubitus view. Large left pleural effusion with small amount of aerated lung at the left apex, fluid appears mobile given change in location of the aerated lung. Unable to comment on cardiomediastinal silhouette as it is obscured. Fluid level in the left upper quadrant is probably in the stomach IMPRESSION: Large left pleural effusion with minimal aeration of the left lung apex. Electronically Signed   By: Donavan Foil M.D.   On: 07/31/2016 21:54   Dg Chest Port 1 View  Result Date: 08/01/2016 CLINICAL DATA:  Post thoracentesis EXAM: PORTABLE CHEST 1 VIEW COMPARISON:  07/31/2016 FINDINGS: Erect portable view chest demonstrates clear right lung. Decreased left pleural effusion with moderate residual pleural fluid noted. No pneumothorax. Improved aeration of the left upper lobe. Dense consolidation in the lingula and left base. Enlarged cardiomediastinal silhouette IMPRESSION: 1. Decreased left pleural effusion with no pneumothorax 2. Moderate residual left pleural effusion. Dense atelectasis or pneumonia in the lingula and left base. Electronically Signed   By: Donavan Foil M.D.   On: 08/01/2016 00:53    STUDIES:  CXR 6/26 > large left effusion. CXR 6/27 > decreased effusion. CXR 6/28 > mod to large left effusion persists (though decreased).  SIGNIFICANT EVENTS  6/26 > admit, left thora with 2.1 L dark blood tinged fluid obtained.  ASSESSMENT / PLAN:  58yoM with history of Prostate cancer, HTN, Anemia, GERD, Hyperlipidemia, and Prior tobacco abuse,  presents to the ER tonight c/o SOB, Fatigue, Nonproductive cough for the past several months, and now Left-sided CP for the past 3 days, found to have large left-sided pleural effusion.     Large Left-sided Pleural effusion:  S/p thora 6/26 with 2.1L dark blood tinged fluid obtained.  Exudative per lights.  Cytology pending as of 6/28.  The sub-acute presentation of his SOB and Fatigue is concerning that this effusion may be malignant in nature. Less likely infectious / parapneumonic. Much less likely due to renal insufficiency or CHF given its unilateral nature. Unlikely hemothorax as he is not on any anticoagulation and denies any history of trauma. Plan: Await cytology results. Consider CT chest for further evaluation / rule out mass etc (though he reports he was never told about any mass / malignancy on recent CTA chest roughly 5 weeks ago (images not available to [place]). Would prefer to hold off on repeat thora for now due to the fact that if cytology is positive, he would likely require pleur-x given rapid re-accumulation.  PCCM will follow peripherally until cytology results have returned.   Montey Hora, [place] [facility] Pager: [phone]  or ([phone])  [place] 08/02/2016, 9:20 AM

## 2016-08-02 NOTE — Progress Notes (Signed)
PROGRESS NOTE    Jeremy Chan  UKG:[id number] DOB: 02-15-1940 DOA: 07/31/2016 PCP: Haze Rushing, MD    Brief Narrative:  Patient is 76 [place] and history of obstructive sleep apnea, prostate cancer, hypertension, hyperlipidemia, coronary artery disease presented to the ED with progressive worsening shortness of breath and occasional chest pain for the past few weeks. Patient noted to have a large left-sided pleural effusion. Pulmonary/critical care consulted and patient underwent a thoracentesis. Results pending.   Assessment & Plan:   Principal Problem:   Pleural effusion Active Problems:   Hyperlipidemia   Essential hypertension   Chest pain   #1 large left pleural effusion Questionable etiology. Patient status post diagnostic and therapeutic paracentesis per pulmonary with 2.1 L of serosanguineous dark orange blood-tinged pleural fluid obtained. Fluid cultures pending. By light's criteria likely an exudate. Cytology with atypical cells present. Patient was seen by pulmonary and patient underwent another thoracentesis with 600 mL of bloody pleural fluid obtained and sent for pathology. Repeat chest x-ray negative for pneumothorax. CT chest ordered per pulmonary for further evaluation. Per pulmonary depending on results may need a pleural biopsy. No need for antibiotics at this time. Pulmonary following and appreciate input and recommendations.  #2 hypertension Stable. Continue home regimen of Norvasc, benazepril.  #3 hyperlipidemia Continue statin.  #4 chest pain Likely secondary to problem #1. Cardiac enzymes negative 3. Clinical improvement. Follow.    DVT prophylaxis: SCDs Code Status: Full Family Communication: Updated patient and daughter at bedside. Disposition Plan: Home when clinically improved and per pulmonary and once results of thoracentesis are completed.   Consultants:   PCCM/pulmonary 07/31/2016 Dr. Jimmey Ralph  Procedures:   Diagnostic and therapeutic  thoracentesis per Dr. Jimmey Ralph 08/01/2016  Chest x-ray 08/01/2016, 07/31/2016, 08/02/2016 Diagnostic and therapeutic thoracentesis per Dr. Elsworth Soho 08/02/2016  Antimicrobials:   None   Subjective: Patient laying in bed after recent thoracentesis. States improvement with shortness of breath. No chest pain.   Objective: Vitals:   08/01/16 1420 08/01/16 2006 08/02/16 0345 08/02/16 0952  BP: 130/72 129/64 124/61 115/72  Pulse: 80 85 75   Resp: 20 18 18    Temp: 98.3 F (36.8 C) 98.9 F (37.2 C) 98.1 F (36.7 C)   TempSrc: Oral Oral Oral   SpO2: 96% 97% 94%   Weight:   97.1 kg (214 lb 1.1 oz)     Intake/Output Summary (Last 24 hours) at 08/02/16 1302 Last data filed at 08/02/16 0952  Gross per 24 hour  Intake              243 ml  Output              120 ml  Net              123 ml   Filed Weights   07/31/16 2300 08/01/16 0439 08/02/16 0345  Weight: 99.3 kg (219 lb) 96.6 kg (212 lb 14.4 oz) 97.1 kg (214 lb 1.1 oz)    Examination:  General exam: Appears calm and comfortable  Respiratory system: Decreased breath sounds in the left base up to a third of the lungs. No wheezing.  Respiratory effort normal. Cardiovascular system: S1 & S2 heard, RRR. No JVD, murmurs, rubs, gallops or clicks. No pedal edema. Gastrointestinal system: Abdomen is nondistended, soft and nontender. No organomegaly or masses felt. Normal bowel sounds heard. Central nervous system: Alert and oriented. No focal neurological deficits. Extremities: Symmetric 5 x 5 power. Skin: No rashes, lesions or ulcers Psychiatry: Judgement and insight appear  normal. Mood & affect appropriate.     Data Reviewed: I have personally reviewed following labs and imaging studies  CBC:  Recent Labs Lab 07/31/16 1815 08/01/16 0604 08/02/16 0315  WBC 12.1* 10.6* 14.1*  NEUTROABS 8.3*  --  10.3*  HGB 14.6 13.9 14.0  HCT 43.9 43.2 43.3  MCV 95.0 94.9 94.3  PLT 271 247 702   Basic Metabolic Panel:  Recent Labs Lab  07/31/16 1815 08/01/16 0604 08/02/16 0315  NA 134* 137 135  K 3.7 4.0 4.1  CL 100* 104 100*  CO2 25 27 28   GLUCOSE 135* 101* 118*  BUN 18 16 12   CREATININE 0.82 0.68 0.75  CALCIUM 9.2 8.7* 8.5*  MG  --  2.0  --   PHOS  --  3.1  --    GFR: CrCl cannot be calculated (Unknown ideal weight.). Liver Function Tests:  Recent Labs Lab 07/31/16 1815 08/01/16 0006 08/01/16 0604 08/02/16 0315  AST 18  --  14* 15  ALT 12*  --  10* 11*  ALKPHOS 83  --  77 73  BILITOT 0.8  --  0.9 1.1  PROT 6.6  --  5.7* 5.3*  ALBUMIN 3.8 3.6 3.1* 2.8*   No results for input(s): LIPASE, AMYLASE in the last 168 hours. No results for input(s): AMMONIA in the last 168 hours. Coagulation Profile:  Recent Labs Lab 07/31/16 2122  INR 1.00   Cardiac Enzymes:  Recent Labs Lab 08/01/16 0006 08/01/16 0604 08/01/16 1054  TROPONINI <0.03 <0.03 <0.03   BNP (last 3 results) No results for input(s): PROBNP in the last 8760 hours. HbA1C: No results for input(s): HGBA1C in the last 72 hours. CBG:  Recent Labs Lab 08/01/16 0629  GLUCAP 102*   Lipid Profile: No results for input(s): CHOL, HDL, LDLCALC, TRIG, CHOLHDL, LDLDIRECT in the last 72 hours. Thyroid Function Tests:  Recent Labs  08/01/16 0006 08/01/16 0604  TSH 4.746*  --   FREET4  --  1.05   Anemia Panel: No results for input(s): VITAMINB12, FOLATE, FERRITIN, TIBC, IRON, RETICCTPCT in the last 72 hours. Sepsis Labs: No results for input(s): PROCALCITON, LATICACIDVEN in the last 168 hours.  Recent Results (from the past 240 hour(s))  Body fluid culture (includes gram stain)     Status: None (Preliminary result)   Collection Time: 08/01/16 12:13 AM  Result Value Ref Range Status   Specimen Description PLEURAL FLUID  Final   Special Requests Normal  Final   Gram Stain   Final    RARE WBC PRESENT,BOTH PMN AND MONONUCLEAR NO ORGANISMS SEEN    Culture NO GROWTH 1 DAY  Final   Report Status PENDING  Incomplete          Radiology Studies: Dg Chest 2 View  Result Date: 08/02/2016 CLINICAL DATA:  Follow-up pleural effusion EXAM: CHEST  2 VIEW COMPARISON:  Chest radiograph from one day prior. FINDINGS: Stable cardiomediastinal silhouette with normal heart size. No pneumothorax. Moderate to large left pleural effusion, slightly decreased. No right pleural effusion. Stable left lung base consolidation. No new focal consolidative airspace disease. IMPRESSION: 1. Moderate to large left pleural effusion, slightly decreased . 2. Stable left lung base consolidation, favor atelectasis, recommend follow-up to resolution. Electronically Signed   By: Ilona Sorrel M.D.   On: 08/02/2016 07:04   Dg Chest 2 View  Result Date: 07/31/2016 CLINICAL DATA:  76 year old with 2-3 day history of chest pain, shortness of breath and cough. EXAM: CHEST  2 VIEW COMPARISON:  05/30/2010. FINDINGS: Very large left pleural effusion with residual aeration of only the left lung apex. The large effusion obscures the cardiac silhouette. Right lung clear. No right pleural effusion. Degenerative changes and DISH involving the thoracic spine. IMPRESSION: Very large left pleural effusion and associated passive atelectasis involving the left lung with only the left apex having residual aeration. Right lung clear. Electronically Signed   By: Evangeline Dakin M.D.   On: 07/31/2016 18:49   Dg Chest Right Decubitus  Result Date: 07/31/2016 CLINICAL DATA:  Shortness of breath EXAM: CHEST - RIGHT DECUBITUS COMPARISON:  07/31/2016 FINDINGS: Right side down decubitus view. Large left pleural effusion with small amount of aerated lung at the left apex, fluid appears mobile given change in location of the aerated lung. Unable to comment on cardiomediastinal silhouette as it is obscured. Fluid level in the left upper quadrant is probably in the stomach IMPRESSION: Large left pleural effusion with minimal aeration of the left lung apex. Electronically Signed    By: Donavan Foil M.D.   On: 07/31/2016 21:54   Dg Chest Portable 1 View  Result Date: 08/02/2016 CLINICAL DATA:  Post LEFT thoracentesis EXAM: PORTABLE CHEST 1 VIEW COMPARISON:  Portable exam 1151 hours compared to 0612 hours FINDINGS: Persistent LEFT pneumothorax. No pneumothorax post thoracentesis. Persistent atelectasis of the LEFT lung base. RIGHT lung clear. Heart size stable. Atherosclerotic calcification aorta. Multilevel endplate spur formation thoracic spine. IMPRESSION: No pneumothorax following LEFT thoracentesis. Persistent LEFT pleural effusion and basilar atelectasis. Aortic Atherosclerosis (ICD10-I70.0). Electronically Signed   By: Lavonia Dana M.D.   On: 08/02/2016 12:19   Dg Chest Port 1 View  Result Date: 08/01/2016 CLINICAL DATA:  Post thoracentesis EXAM: PORTABLE CHEST 1 VIEW COMPARISON:  07/31/2016 FINDINGS: Erect portable view chest demonstrates clear right lung. Decreased left pleural effusion with moderate residual pleural fluid noted. No pneumothorax. Improved aeration of the left upper lobe. Dense consolidation in the lingula and left base. Enlarged cardiomediastinal silhouette IMPRESSION: 1. Decreased left pleural effusion with no pneumothorax 2. Moderate residual left pleural effusion. Dense atelectasis or pneumonia in the lingula and left base. Electronically Signed   By: Donavan Foil M.D.   On: 08/01/2016 00:53        Scheduled Meds: . amLODipine  5 mg Oral Daily  . atorvastatin  40 mg Oral q1800  . benazepril  20 mg Oral Daily  . guaiFENesin  600 mg Oral BID  . sodium chloride flush  3 mL Intravenous Q12H  . sodium chloride flush  3 mL Intravenous Q12H   Continuous Infusions: . sodium chloride       LOS: 1 day    Time spent: 46 minutes    Jshon Ibe, MD [facility] Pager [phone]  If 7PM-7AM, please contact night-coverage [email or web address] Password [email or web address] 08/02/2016, 1:02 PM

## 2016-08-03 DIAGNOSIS — J189 Pneumonia, unspecified organism: Secondary | ICD-10-CM | POA: Diagnosis present

## 2016-08-03 DIAGNOSIS — J910 Malignant pleural effusion: Secondary | ICD-10-CM

## 2016-08-03 DIAGNOSIS — J181 Lobar pneumonia, unspecified organism: Secondary | ICD-10-CM

## 2016-08-03 LAB — CBC WITH DIFFERENTIAL/PLATELET
BASOS PCT: 0 %
Basophils Absolute: 0 10*3/uL (ref 0.0–0.1)
Eosinophils Absolute: 0.2 10*3/uL (ref 0.0–0.7)
Eosinophils Relative: 2 %
HEMATOCRIT: 42.5 % (ref 39.0–52.0)
Hemoglobin: 14.1 g/dL (ref 13.0–17.0)
Lymphocytes Relative: 11 %
Lymphs Abs: 1.6 10*3/uL (ref 0.7–4.0)
MCH: 31 pg (ref 26.0–34.0)
MCHC: 33.2 g/dL (ref 30.0–36.0)
MCV: 93.4 fL (ref 78.0–100.0)
MONO ABS: 2.2 10*3/uL — AB (ref 0.1–1.0)
MONOS PCT: 15 %
NEUTROS ABS: 10.8 10*3/uL — AB (ref 1.7–7.7)
Neutrophils Relative %: 72 %
Platelets: 224 10*3/uL (ref 150–400)
RBC: 4.55 MIL/uL (ref 4.22–5.81)
RDW: 13.5 % (ref 11.5–15.5)
WBC: 14.9 10*3/uL — ABNORMAL HIGH (ref 4.0–10.5)

## 2016-08-03 LAB — BASIC METABOLIC PANEL
Anion gap: 6 (ref 5–15)
BUN: 11 mg/dL (ref 6–20)
CALCIUM: 8.3 mg/dL — AB (ref 8.9–10.3)
CO2: 26 mmol/L (ref 22–32)
CREATININE: 0.71 mg/dL (ref 0.61–1.24)
Chloride: 98 mmol/L — ABNORMAL LOW (ref 101–111)
GFR calc non Af Amer: >60 mL/min (ref >60)
GLUCOSE: 138 mg/dL — AB (ref 65–99)
Potassium: 3.7 mmol/L (ref 3.5–5.1)
Sodium: 130 mmol/L — ABNORMAL LOW (ref 135–145)

## 2016-08-03 LAB — STREP PNEUMONIAE URINARY ANTIGEN: Strep Pneumo Urinary Antigen: NEGATIVE

## 2016-08-03 LAB — CHOLESTEROL, BODY FLUID: CHOL FL: 68 mg/dL

## 2016-08-03 MED ORDER — CEFTRIAXONE SODIUM 1 G IJ SOLR
1.0000 g | INTRAMUSCULAR | Status: DC
  Administered 2016-08-03 – 2016-08-08 (×6): 1 g via INTRAVENOUS
  Filled 2016-08-03 (×6): qty 10

## 2016-08-03 MED ORDER — DEXTROSE 5 % IV SOLN
500.0000 mg | INTRAVENOUS | Status: DC
  Administered 2016-08-03 – 2016-08-08 (×6): 500 mg via INTRAVENOUS
  Filled 2016-08-03 (×7): qty 500

## 2016-08-03 NOTE — Progress Notes (Signed)
Patient ambulated in hallway x1 assist 500 feet oxygen level remained 94-100% on room air. Will monitor patient. Glena Pharris, Bettina Gavia rN

## 2016-08-03 NOTE — Progress Notes (Signed)
Name: Jeremy Chan MRN: [id number] DOB: 05/12/1940    ADMISSION DATE:  07/31/2016 CONSULTATION DATE:  07/31/16  REFERRING MD :  Roel Cluck  CHIEF COMPLAINT:  Pleural Effusion   HISTORY OF PRESENT ILLNESS:  Jeremy Chan is a 76 y.o. male  remote smoker, but 20 pack years ,admitted with large left effusion. Of note had normal chest x-ray in 05/2016   PMH of Prostate cancer (s/p seed implants ~2002), HTN, Anemia, GERD, Hyperlipidemia, and Prior tobacco abuse   Significant tests/ events reviewed  left thoracentesis  6/26 > 2.1L dark orange blood tinged fluid   Cytology- atypical cells Rpt thora 6/28 >> 600 cc CT chest 6/28 moderate left effusion with underlying consolidation, multiple bilateral nodules, largest 11 mm   SUBJECTIVE:  Denies chest pain or dyspnea or cough  VITAL SIGNS: Temp:  [98.5 F (36.9 C)-99.1 F (37.3 C)] 98.7 F (37.1 C) (06/29 0356) Pulse Rate:  [78-85] 79 (06/29 0356) Resp:  [18-19] 18 (06/29 0356) BP: (109-134)/(60-71) 130/71 (06/29 0356) SpO2:  [95 %-96 %] 96 % (06/29 0356) Weight:  [213 lb 14.4 oz (97 kg)] 213 lb 14.4 oz (97 kg) (06/29 0356)  PHYSICAL EXAMINATION: Gen. Pleasant, well-nourished, in no distress, normal affect ENT - no lesions, no post nasal drip Neck: No JVD, no thyromegaly, no carotid bruits Lungs: no use of accessory muscles, no dullness to percussion, decreased on left without rales or rhonchi  Cardiovascular: Rhythm regular, heart sounds  normal, no murmurs or gallops, no peripheral edema Abdomen: soft and non-tender, no hepatosplenomegaly, BS normal. Musculoskeletal: No deformities, no cyanosis or clubbing Neuro:  alert, non focal    Recent Labs Lab 08/01/16 0604 08/02/16 0315 08/03/16 0205  NA 137 135 130*  K 4.0 4.1 3.7  CL 104 100* 98*  CO2 27 28 26   BUN 16 12 11   CREATININE 0.68 0.75 0.71  GLUCOSE 101* 118* 138*    Recent Labs Lab 08/01/16 0604 08/02/16 0315 08/03/16 0205  HGB 13.9 14.0 14.1  HCT 43.2  43.3 42.5  WBC 10.6* 14.1* 14.9*  PLT 247 229 224   Dg Chest 2 View  Result Date: 08/02/2016 CLINICAL DATA:  Follow-up pleural effusion EXAM: CHEST  2 VIEW COMPARISON:  Chest radiograph from one day prior. FINDINGS: Stable cardiomediastinal silhouette with normal heart size. No pneumothorax. Moderate to large left pleural effusion, slightly decreased. No right pleural effusion. Stable left lung base consolidation. No new focal consolidative airspace disease. IMPRESSION: 1. Moderate to large left pleural effusion, slightly decreased . 2. Stable left lung base consolidation, favor atelectasis, recommend follow-up to resolution. Electronically Signed   By: Ilona Sorrel M.D.   On: 08/02/2016 07:04   Ct Chest Wo Contrast  Result Date: 08/02/2016 CLINICAL DATA:  Shortness of breath, known left pleural effusion EXAM: CT CHEST WITHOUT CONTRAST TECHNIQUE: Multidetector CT imaging of the chest was performed following the standard protocol without IV contrast. COMPARISON:  08/02/2016 FINDINGS: Cardiovascular: Somewhat limited due to the lack of IV contrast. Aortic calcifications are noted. No aneurysmal dilatation is noted. Coronary calcifications are seen. No significant cardiac enlargement is noted. Mediastinum/Nodes: The esophagus is within normal limits. Scattered small mediastinal and hilar lymph nodes are seen. None of these are significant by size criteria. Lungs/Pleura: Moderate left pleural effusion remains similar to that seen on prior plain film examination. Considerable consolidation within the lower lobe is seen. Few small subpleural nodules are noted measuring less than 5 mm. Some patchy changes are noted in the left upper lobe  which may be related to some re-expansion edema. A pleural-based nodule measuring 11 mm is noted on the left on image number 37 of series 8. The right lung shows some subpleural nodules. The largest of these measures 11 mm in the right lower lobe on image number 95 of series 8.  Upper Abdomen: No acute abnormality is noted in the upper abdomen. Musculoskeletal: Degenerative changes of the thoracic spine are seen. IMPRESSION: Persistent moderate left pleural effusion with left lower lobe consolidation. Some mild re-expansion edema is noted in the right upper lobe. No pneumothorax is noted. Multiple bilateral lung nodules. The largest of these measures 11 mm bilaterally. Non-contrast chest CT at 3-6 months is recommended. If the nodules are stable at time of repeat CT, then future CT at 18-24 months (from today's scan) is considered optional for low-risk patients, but is recommended for high-risk patients. This recommendation follows the consensus statement: Guidelines for Management of Incidental Pulmonary Nodules Detected on CT Images: From the Fleischner Society 2017; Radiology 2017; 284:228-243. These results will be called to the ordering clinician or representative by the Radiologist Assistant, and communication documented in the PACS or zVision Dashboard. Aortic Atherosclerosis (ICD10-I70.0). Electronically Signed   By: Inez Catalina M.D.   On: 08/02/2016 16:05   Dg Chest Portable 1 View  Result Date: 08/02/2016 CLINICAL DATA:  Post LEFT thoracentesis EXAM: PORTABLE CHEST 1 VIEW COMPARISON:  Portable exam 1151 hours compared to 0612 hours FINDINGS: Persistent LEFT pneumothorax. No pneumothorax post thoracentesis. Persistent atelectasis of the LEFT lung base. RIGHT lung clear. Heart size stable. Atherosclerotic calcification aorta. Multilevel endplate spur formation thoracic spine. IMPRESSION: No pneumothorax following LEFT thoracentesis. Persistent LEFT pleural effusion and basilar atelectasis. Aortic Atherosclerosis (ICD10-I70.0). Electronically Signed   By: Lavonia Dana M.D.   On: 08/02/2016 12:19       ASSESSMENT / PLAN:  Large left pleural effusion that has developed acutely over the last 3 months, lymphocytic exudate with atypical cells on cytology  Plan: Await repeat  cytology results. Agree with empiric antibiotics although he did not have fever, leukocytosis to suggest pneumonia. If cytology again negative, we'll need to proceed with pleural biopsy  Kara Mead MD. FCCP. [facility] Pager [phone] If no response call [phone]    08/03/2016, 12:14 PM

## 2016-08-03 NOTE — Progress Notes (Signed)
PROGRESS NOTE    Jeremy Chan  LSL:[id number] DOB: [birth year] DOA: 07/31/2016 PCP: Haze Rushing, MD    Brief Narrative:  Patient is 54 [place] and history of obstructive sleep apnea, prostate cancer, hypertension, hyperlipidemia, coronary artery disease presented to the ED with progressive worsening shortness of breath and occasional chest pain for the past few weeks. Patient noted to have a large left-sided pleural effusion. Pulmonary/critical care consulted and patient underwent a thoracentesis. Results pending.   Assessment & Plan:   Principal Problem:   Pleural effusion Active Problems:   CAP (community acquired pneumonia)   Hyperlipidemia   Essential hypertension   Chest pain   S/P thoracentesis   #1 large left pleural effusion Questionable etiology. ?? Parapneumonic effusion. Patient status post diagnostic and therapeutic paracentesis per pulmonary with 2.1 L of serosanguineous dark orange blood-tinged pleural fluid obtained. Fluid cultures pending. By light's criteria likely an exudate. Cytology with atypical cells present. Patient was seen by pulmonary and patient underwent another thoracentesis with 600 mL of bloody pleural fluid obtained and sent for pathology (08/02/2016). Repeat chest x-ray negative for pneumothorax. CT chest ordered per pulmonary for further evaluation with bilateral lung nodules, persistent moderate left pleural effusion with left lower lobe consolidation, no pneumothorax. Due to consulted today she noted on CT chest and leukocytosis will place patient empirically on IV Rocephin IV azithromycin. Per pulmonary depending on results may need a pleural biopsy. Pulmonary following and appreciate input and recommendations.  #2  probable community acquired pneumonia Per CT chest 08/02/2016. Patient with a large left pleural effusion and left lower lobe consultation noted on CT chest. Patient also with a leukocytosis. Check a sputum Gram stain and culture. Check a  urine Legionella antigen. Check a urine pneumococcus antigen. Place empirically on IV Rocephin and IV azithromycin. Pulmonary following.   #3 hypertension Stable. Continue home regimen of Norvasc, benazepril.  #4 hyperlipidemia Continue statin.  #5 chest pain Likely secondary to problem #1. Cardiac enzymes negative 3. Clinical improvement. Follow.    DVT prophylaxis: SCDs Code Status: Full Family Communication: Updated patient and daughter at bedside. Disposition Plan: Home when clinically improved and per pulmonary and once results of thoracentesis are completed.   Consultants:   PCCM/pulmonary 07/31/2016 Dr. Jimmey Ralph  Procedures:   Diagnostic and therapeutic thoracentesis per Dr. Jimmey Ralph 08/01/2016  Chest x-ray 08/01/2016, 07/31/2016, 08/02/2016 Diagnostic and therapeutic thoracentesis per Dr. Elsworth Soho 08/02/2016 Ct chest  08/02/2016   Antimicrobials:   None   Subjective: Patient denies any chest. Patient states has had a little bit of a cough however no significant productive sputum. Patient states shortness of breath improved from admission.   Objective: Vitals:   08/02/16 1312 08/02/16 2014 08/02/16 2100 08/03/16 0356  BP: 109/60 134/64  130/71  Pulse: 78 85  79  Resp: 19 18  18   Temp: 98.5 F (36.9 C) 99.1 F (37.3 C)  98.7 F (37.1 C)  TempSrc: Oral Oral  Oral  SpO2: 95% 96%  96%  Weight:    97 kg (213 lb 14.4 oz)  Height:   5\' 9"  (1.753 m)     Intake/Output Summary (Last 24 hours) at 08/03/16 1127 Last data filed at 08/03/16 0600  Gross per 24 hour  Intake              720 ml  Output              650 ml  Net  70 ml   Filed Weights   08/01/16 0439 08/02/16 0345 08/03/16 0356  Weight: 96.6 kg (212 lb 14.4 oz) 97.1 kg (214 lb 1.1 oz) 97 kg (213 lb 14.4 oz)    Examination:  General exam: Appears calm and comfortable  Respiratory system: Decreased breath sounds in the left base up to a third of the lungs. No wheezing.  Respiratory  effort normal. Cardiovascular system: S1 & S2 heard, RRR. No JVD, murmurs, rubs, gallops or clicks. No pedal edema. Gastrointestinal system: Abdomen is nondistended, soft and nontender. No organomegaly or masses felt. Normal bowel sounds heard. Central nervous system: Alert and oriented. No focal neurological deficits. Extremities: Symmetric 5 x 5 power. Skin: No rashes, lesions or ulcers Psychiatry: Judgement and insight appear normal. Mood & affect appropriate.     Data Reviewed: I have personally reviewed following labs and imaging studies  CBC:  Recent Labs Lab 07/31/16 1815 08/01/16 0604 08/02/16 0315 08/03/16 0205  WBC 12.1* 10.6* 14.1* 14.9*  NEUTROABS 8.3*  --  10.3* 10.8*  HGB 14.6 13.9 14.0 14.1  HCT 43.9 43.2 43.3 42.5  MCV 95.0 94.9 94.3 93.4  PLT 271 247 229 742   Basic Metabolic Panel:  Recent Labs Lab 07/31/16 1815 08/01/16 0604 08/02/16 0315 08/03/16 0205  NA 134* 137 135 130*  K 3.7 4.0 4.1 3.7  CL 100* 104 100* 98*  CO2 25 27 28 26   GLUCOSE 135* 101* 118* 138*  BUN 18 16 12 11   CREATININE 0.82 0.68 0.75 0.71  CALCIUM 9.2 8.7* 8.5* 8.3*  MG  --  2.0  --   --   PHOS  --  3.1  --   --    GFR: Estimated Creatinine Clearance: 88.8 mL/min (by C-G formula based on SCr of 0.71 mg/dL). Liver Function Tests:  Recent Labs Lab 07/31/16 1815 08/01/16 0006 08/01/16 0604 08/02/16 0315  AST 18  --  14* 15  ALT 12*  --  10* 11*  ALKPHOS 83  --  77 73  BILITOT 0.8  --  0.9 1.1  PROT 6.6  --  5.7* 5.3*  ALBUMIN 3.8 3.6 3.1* 2.8*   No results for input(s): LIPASE, AMYLASE in the last 168 hours. No results for input(s): AMMONIA in the last 168 hours. Coagulation Profile:  Recent Labs Lab 07/31/16 2122  INR 1.00   Cardiac Enzymes:  Recent Labs Lab 08/01/16 0006 08/01/16 0604 08/01/16 1054  TROPONINI <0.03 <0.03 <0.03   BNP (last 3 results) No results for input(s): PROBNP in the last 8760 hours. HbA1C: No results for input(s): HGBA1C in  the last 72 hours. CBG:  Recent Labs Lab 08/01/16 0629  GLUCAP 102*   Lipid Profile: No results for input(s): CHOL, HDL, LDLCALC, TRIG, CHOLHDL, LDLDIRECT in the last 72 hours. Thyroid Function Tests:  Recent Labs  08/01/16 0006 08/01/16 0604  TSH 4.746*  --   FREET4  --  1.05   Anemia Panel: No results for input(s): VITAMINB12, FOLATE, FERRITIN, TIBC, IRON, RETICCTPCT in the last 72 hours. Sepsis Labs: No results for input(s): PROCALCITON, LATICACIDVEN in the last 168 hours.  Recent Results (from the past 240 hour(s))  Fungus Culture With Stain     Status: None (Preliminary result)   Collection Time: 07/31/16 11:59 PM  Result Value Ref Range Status   Fungus Stain Final report  Final    Comment: (NOTE) Performed At: [facility] [place], [place] [id number] Lindon Romp MD [id number]  Fungus (Mycology) Culture PENDING  Incomplete   Fungal Source PLEURAL  Final  Fungus Culture Result     Status: None   Collection Time: 07/31/16 11:59 PM  Result Value Ref Range Status   Result 1 Comment  Final    Comment: (NOTE) KOH/Calcofluor preparation:  no fungus observed. Performed At: [facility] [place], [place] [id number] Lindon Romp MD [id number]   Body fluid culture (includes gram stain)     Status: None (Preliminary result)   Collection Time: 08/01/16 12:13 AM  Result Value Ref Range Status   Specimen Description PLEURAL FLUID  Final   Special Requests Normal  Final   Gram Stain   Final    RARE WBC PRESENT,BOTH PMN AND MONONUCLEAR NO ORGANISMS SEEN    Culture NO GROWTH 2 DAYS  Final   Report Status PENDING  Incomplete         Radiology Studies: Dg Chest 2 View  Result Date: 08/02/2016 CLINICAL DATA:  Follow-up pleural effusion EXAM: CHEST  2 VIEW COMPARISON:  Chest radiograph from one day prior. FINDINGS: Stable cardiomediastinal silhouette with normal heart size. No pneumothorax. Moderate to  large left pleural effusion, slightly decreased. No right pleural effusion. Stable left lung base consolidation. No new focal consolidative airspace disease. IMPRESSION: 1. Moderate to large left pleural effusion, slightly decreased . 2. Stable left lung base consolidation, favor atelectasis, recommend follow-up to resolution. Electronically Signed   By: Ilona Sorrel M.D.   On: 08/02/2016 07:04   Ct Chest Wo Contrast  Result Date: 08/02/2016 CLINICAL DATA:  Shortness of breath, known left pleural effusion EXAM: CT CHEST WITHOUT CONTRAST TECHNIQUE: Multidetector CT imaging of the chest was performed following the standard protocol without IV contrast. COMPARISON:  08/02/2016 FINDINGS: Cardiovascular: Somewhat limited due to the lack of IV contrast. Aortic calcifications are noted. No aneurysmal dilatation is noted. Coronary calcifications are seen. No significant cardiac enlargement is noted. Mediastinum/Nodes: The esophagus is within normal limits. Scattered small mediastinal and hilar lymph nodes are seen. None of these are significant by size criteria. Lungs/Pleura: Moderate left pleural effusion remains similar to that seen on prior plain film examination. Considerable consolidation within the lower lobe is seen. Few small subpleural nodules are noted measuring less than 5 mm. Some patchy changes are noted in the left upper lobe which may be related to some re-expansion edema. A pleural-based nodule measuring 11 mm is noted on the left on image number 37 of series 8. The right lung shows some subpleural nodules. The largest of these measures 11 mm in the right lower lobe on image number 95 of series 8. Upper Abdomen: No acute abnormality is noted in the upper abdomen. Musculoskeletal: Degenerative changes of the thoracic spine are seen. IMPRESSION: Persistent moderate left pleural effusion with left lower lobe consolidation. Some mild re-expansion edema is noted in the right upper lobe. No pneumothorax is  noted. Multiple bilateral lung nodules. The largest of these measures 11 mm bilaterally. Non-contrast chest CT at 3-6 months is recommended. If the nodules are stable at time of repeat CT, then future CT at 18-24 months (from today's scan) is considered optional for low-risk patients, but is recommended for high-risk patients. This recommendation follows the consensus statement: Guidelines for Management of Incidental Pulmonary Nodules Detected on CT Images: From the Fleischner Society 2017; Radiology 2017; 284:228-243. These results will be called to the ordering clinician or representative by the Radiologist Assistant, and communication documented in the PACS or zVision Dashboard. Aortic  Atherosclerosis (ICD10-I70.0). Electronically Signed   By: Inez Catalina M.D.   On: 08/02/2016 16:05   Dg Chest Portable 1 View  Result Date: 08/02/2016 CLINICAL DATA:  Post LEFT thoracentesis EXAM: PORTABLE CHEST 1 VIEW COMPARISON:  Portable exam 1151 hours compared to 0612 hours FINDINGS: Persistent LEFT pneumothorax. No pneumothorax post thoracentesis. Persistent atelectasis of the LEFT lung base. RIGHT lung clear. Heart size stable. Atherosclerotic calcification aorta. Multilevel endplate spur formation thoracic spine. IMPRESSION: No pneumothorax following LEFT thoracentesis. Persistent LEFT pleural effusion and basilar atelectasis. Aortic Atherosclerosis (ICD10-I70.0). Electronically Signed   By: Lavonia Dana M.D.   On: 08/02/2016 12:19        Scheduled Meds: . amLODipine  5 mg Oral Daily  . atorvastatin  40 mg Oral q1800  . benazepril  20 mg Oral Daily  . guaiFENesin  600 mg Oral BID  . senna-docusate  1 tablet Oral BID  . sodium chloride flush  3 mL Intravenous Q12H  . sodium chloride flush  3 mL Intravenous Q12H   Continuous Infusions: . sodium chloride    . azithromycin    . cefTRIAXone (ROCEPHIN)  IV       LOS: 2 days    Time spent: 58 minutes    THOMPSON,DANIEL, MD [facility] Pager [phone]  If 7PM-7AM, please contact night-coverage [email or web address] Password [facility] 08/03/2016, 11:27 AM

## 2016-08-03 NOTE — Consult Note (Signed)
[address]       [place] [address]             [facility] Record #[id number] Date of Birth: [birth year]  Referring: No ref. provider found Primary Care: Haze Rushing, MD  Chief Complaint:    Chief Complaint  Patient presents with  . Chest Pain    History of Present Illness:     Mr. Jeremy Chan and 76 year old male with a past medical history of prostate cancer, acid reflux, hyperlipidemia, prior tobacco abuse, and hypertension who presented to the emergency department and on 07/31/2016 with shortness of breath and chest pain. He quit smoking back in 1986 but had smoked for 28 years about 2-3 packs a day. He shares that the shortness of breath started about 6 months ago. He also states that it has been gradually progressing in severity. He also reports that his coughing has worsened but continues to be nonproductive. He has also had left-sided chest pain that began a few days ago as a severe sharp radiating pain through his back. He also reports a decrease in energy and generalized fatigue over the last several months. He was found to have a large left pleural effusion and pulmonology was consulted for thoracentesis. About 2.1L of dark orange blood-tinged fluid was drained out of the left chest on 07/31/2016. Another 600 mL of blood-tinged fluid was drained out of the left chest on 08/02/2016. A CT scan of the chest was performed on 08/02/2016 which showed a moderate left effusion with underlying consolidation, multiple bilateral nodules, the largest being 11 mm. Cytology was sent at this time and was negative. We are consulted for pleural biopsy.  Current Activity/ Functional Status: Patient was independent with mobility/ambulation, transfers, ADL's, IADL's.   Zubrod Score: At the time of surgery this patient's most appropriate activity status/level should be described as: []     0    Normal activity, no symptoms [x]     1     Restricted in physical strenuous activity but ambulatory, able to do out light work []     2    Ambulatory and capable of self care, unable to do work activities, up and about                 more than 50%  Of the time                            []     3    Only limited self care, in bed greater than 50% of waking hours []     4    Completely disabled, no self care, confined to bed or chair []     5    Moribund  Past Medical History:  Diagnosis Date  . Age-related bone loss   . Erectile dysfunction   . GERD (gastroesophageal reflux disease)   . Hyperkalemia   . Hyperlipidemia   . Hypertension   . Other dietary vitamin B12 deficiency anemia   . Prostate cancer ([place])   . Urinary tract infection   . Vitamin D deficiency     History reviewed. No pertinent surgical history.  History  Smoking Status  . Former Smoker  . Types: Cigarettes  . Quit date: 1986  Smokeless Tobacco  . Never Used    History  Alcohol Use  . Yes  Social History   Social History  . Marital status: Married    Spouse name: N/A  . Number of children: N/A  . Years of education: N/A   Occupational History  . Not on file.   Social History Main Topics  . Smoking status: Former Smoker    Types: Cigarettes    Quit date: 10  . Smokeless tobacco: Never Used  . Alcohol use Yes  . Drug use: Yes    Types: Marijuana  . Sexual activity: Not on file   Other Topics Concern  . Not on file   Social History Narrative  . No narrative on file    No Known Allergies  Current Facility-Administered Medications  Medication Dose Route Frequency Provider Last Rate Last Dose  . 0.9 %  sodium chloride infusion  250 mL Intravenous PRN Toy Baker, MD      . acetaminophen (TYLENOL) tablet 650 mg  650 mg Oral Q6H PRN Doutova, Anastassia, MD       Or  . acetaminophen (TYLENOL) suppository 650 mg  650 mg Rectal Q6H PRN Doutova, Anastassia, MD      . albuterol (PROVENTIL) (2.5 MG/3ML) 0.083% nebulizer  solution 2.5 mg  2.5 mg Nebulization Q2H PRN Doutova, Anastassia, MD      . amLODipine (NORVASC) tablet 5 mg  5 mg Oral Daily Doutova, Anastassia, MD   5 mg at 08/03/16 1028  . atorvastatin (LIPITOR) tablet 40 mg  40 mg Oral q1800 Toy Baker, MD   40 mg at 08/02/16 1704  . azithromycin (ZITHROMAX) 500 mg in dextrose 5 % 250 mL IVPB  500 mg Intravenous Q24H Eugenie Filler, MD   Stopped at 08/03/16 1420  . benazepril (LOTENSIN) tablet 20 mg  20 mg Oral Daily Doutova, Anastassia, MD   20 mg at 08/03/16 1028  . cefTRIAXone (ROCEPHIN) 1 g in dextrose 5 % 50 mL IVPB  1 g Intravenous Q24H Eugenie Filler, MD   Stopped at 08/03/16 1257  . guaiFENesin (MUCINEX) 12 hr tablet 600 mg  600 mg Oral BID Toy Baker, MD   600 mg at 08/03/16 1028  . HYDROcodone-acetaminophen (NORCO/VICODIN) 5-325 MG per tablet 1-2 tablet  1-2 tablet Oral Q4H PRN Toy Baker, MD   2 tablet at 08/02/16 2152  . ketorolac (TORADOL) 30 MG/ML injection 30 mg  30 mg Intravenous Q6H PRN Hammonds, Sharyn Blitz, MD   30 mg at 08/01/16 0015  . ondansetron (ZOFRAN) tablet 4 mg  4 mg Oral Q6H PRN Doutova, Anastassia, MD       Or  . ondansetron (ZOFRAN) injection 4 mg  4 mg Intravenous Q6H PRN Doutova, Anastassia, MD      . polyethylene glycol (MIRALAX / GLYCOLAX) packet 17 g  17 g Oral Daily PRN Eugenie Filler, MD      . senna-docusate (Senokot-S) tablet 1 tablet  1 tablet Oral BID Eugenie Filler, MD   1 tablet at 08/03/16 1101  . sodium chloride flush (NS) 0.9 % injection 3 mL  3 mL Intravenous Q12H Doutova, Anastassia, MD   3 mL at 08/03/16 1033  . sodium chloride flush (NS) 0.9 % injection 3 mL  3 mL Intravenous Q12H Toy Baker, MD   3 mL at 08/02/16 0952  . sodium chloride flush (NS) 0.9 % injection 3 mL  3 mL Intravenous PRN Toy Baker, MD        Prescriptions Prior to Admission  Medication Sig Dispense Refill Last Dose  . amLODipine-benazepril (LOTREL) 5-20  MG capsule Take 1  capsule by mouth daily.   07/31/2016 at Unknown time  . aspirin 81 MG chewable tablet Chew 81 mg by mouth daily.   07/31/2016 at Unknown time  . atorvastatin (LIPITOR) 40 MG tablet Take 40 mg by mouth daily.   07/31/2016 at Unknown time  . ibuprofen (ADVIL,MOTRIN) 200 MG tablet Take 800 mg by mouth every 6 (six) hours as needed.   07/30/2016 at Unknown time    Family History  Problem Relation Age of Onset  . Prostate cancer Father   . Prostate cancer Brother   . Diabetes Other   . CAD Neg Hx   . Stroke Neg Hx      Review of Systems:      Cardiac Review of Systems: Y or N  Chest Pain [ Y   ]  Resting SOB [   ] Exertional SOB  [Y  ]  Orthopnea [  ]   Pedal Edema [  N ]    Palpitations [  ] Syncope  [ N ]   Presyncope [   ]  General Review of Systems: [Y] = yes [  ]=no Constitional: recent weight change [ N ]; anorexia [  ]; fatigue [ Y ]; nausea [  ]; night sweats [  ]; fever [ N ]; or chills Aqua.Slicker  ]                                                               Dental: poor dentition[  ]; Last Dentist visit:   Eye : blurred vision [  ]; diplopia [   ]; vision changes [  ];  Amaurosis fugax[  ]; Resp: cough [Y  ];  wheezing[N  ];  hemoptysis[  ]; shortness of breath[ Y ]; paroxysmal nocturnal dyspnea[  ]; dyspnea on exertion[  ]; or orthopnea[  ];  GI:  gallstones[  ], vomiting[ N ];  dysphagia[  ]; melena[  ];  hematochezia [  ]; heartburn[ N ];   Hx of  Colonoscopy[  ]; GU: kidney stones [  ]; hematuria[  ];   dysuria [  ];  nocturia[  ];  history of     obstruction [ N ]; urinary frequency [  ]             Skin: rash, swelling[  ];, hair loss[  ];  peripheral edema[  ];  or itching[  ]; Musculosketetal: myalgias[  ];  joint swelling[ N ];  joint erythema[  ];  joint pain[  ];  back pain[ N ];  Heme/Lymph: bruising[  ];  bleeding[ N ];  anemia[  ];  Neuro: TIA[  ];  headaches[ N ];  stroke[  ];  vertigo[  ];  seizures[  ];   paresthesias[  ];  difficulty walking[N  ];  Psych:depression[N   ]; anxiety[ N ];  Endocrine: diabetes[ N ];  thyroid dysfunction[Y ];  Immunizations: Flu [  ]; Pneumococcal[  ];  Other:  Physical Exam: BP 129/64 (BP Location: Right Arm)   Pulse 90   Temp 98 F (36.7 C) (Oral)   Resp 18   Ht 5\' 9"  (1.753 m)   Wt 97 kg (213 lb 14.4 oz)   SpO2 97%  BMI 31.59 kg/m    General appearance: alert, cooperative and no distress Resp: CTA bilaterally Cardio: regular rate and rhythm, S1, S2 normal, no murmur, click, rub or gallop GI: soft, non-tender; bowel sounds normal; no masses,  no organomegaly Extremities: extremities normal, atraumatic, no cyanosis or edema Neurologic: Grossly normal Decreased breath sounds left      Recent Radiology Findings:   Dg Chest 2 View  Result Date: 08/02/2016 CLINICAL DATA:  Follow-up pleural effusion EXAM: CHEST  2 VIEW COMPARISON:  Chest radiograph from one day prior. FINDINGS: Stable cardiomediastinal silhouette with normal heart size. No pneumothorax. Moderate to large left pleural effusion, slightly decreased. No right pleural effusion. Stable left lung base consolidation. No new focal consolidative airspace disease. IMPRESSION: 1. Moderate to large left pleural effusion, slightly decreased . 2. Stable left lung base consolidation, favor atelectasis, recommend follow-up to resolution. Electronically Signed   By: Ilona Sorrel M.D.   On: 08/02/2016 07:04   Ct Chest Wo Contrast  Result Date: 08/02/2016 CLINICAL DATA:  Shortness of breath, known left pleural effusion EXAM: CT CHEST WITHOUT CONTRAST TECHNIQUE: Multidetector CT imaging of the chest was performed following the standard protocol without IV contrast. COMPARISON:  08/02/2016 FINDINGS: Cardiovascular: Somewhat limited due to the lack of IV contrast. Aortic calcifications are noted. No aneurysmal dilatation is noted. Coronary calcifications are seen. No significant cardiac enlargement is noted. Mediastinum/Nodes: The esophagus is within normal limits. Scattered  small mediastinal and hilar lymph nodes are seen. None of these are significant by size criteria. Lungs/Pleura: Moderate left pleural effusion remains similar to that seen on prior plain film examination. Considerable consolidation within the lower lobe is seen. Few small subpleural nodules are noted measuring less than 5 mm. Some patchy changes are noted in the left upper lobe which may be related to some re-expansion edema. A pleural-based nodule measuring 11 mm is noted on the left on image number 37 of series 8. The right lung shows some subpleural nodules. The largest of these measures 11 mm in the right lower lobe on image number 95 of series 8. Upper Abdomen: No acute abnormality is noted in the upper abdomen. Musculoskeletal: Degenerative changes of the thoracic spine are seen. IMPRESSION: Persistent moderate left pleural effusion with left lower lobe consolidation. Some mild re-expansion edema is noted in the right upper lobe. No pneumothorax is noted. Multiple bilateral lung nodules. The largest of these measures 11 mm bilaterally. Non-contrast chest CT at 3-6 months is recommended. If the nodules are stable at time of repeat CT, then future CT at 18-24 months (from today's scan) is considered optional for low-risk patients, but is recommended for high-risk patients. This recommendation follows the consensus statement: Guidelines for Management of Incidental Pulmonary Nodules Detected on CT Images: From the Fleischner Society 2017; Radiology 2017; 284:228-243. These results will be called to the ordering clinician or representative by the Radiologist Assistant, and communication documented in the PACS or zVision Dashboard. Aortic Atherosclerosis (ICD10-I70.0). Electronically Signed   By: Inez Catalina M.D.   On: 08/02/2016 16:05   Dg Chest Portable 1 View  Result Date: 08/02/2016 CLINICAL DATA:  Post LEFT thoracentesis EXAM: PORTABLE CHEST 1 VIEW COMPARISON:  Portable exam 1151 hours compared to 0612  hours FINDINGS: Persistent LEFT pneumothorax. No pneumothorax post thoracentesis. Persistent atelectasis of the LEFT lung base. RIGHT lung clear. Heart size stable. Atherosclerotic calcification aorta. Multilevel endplate spur formation thoracic spine. IMPRESSION: No pneumothorax following LEFT thoracentesis. Persistent LEFT pleural effusion and basilar atelectasis. Aortic Atherosclerosis (ICD10-I70.0).  Electronically Signed   By: Lavonia Dana M.D.   On: 08/02/2016 12:19     Recent Lab Findings: Lab Results  Component Value Date   WBC 14.9 (H) 08/03/2016   HGB 14.1 08/03/2016   HCT 42.5 08/03/2016   PLT 224 08/03/2016   GLUCOSE 138 (H) 08/03/2016   ALT 11 (L) 08/02/2016   AST 15 08/02/2016   NA 130 (L) 08/03/2016   K 3.7 08/03/2016   CL 98 (L) 08/03/2016   CREATININE 0.71 08/03/2016   BUN 11 08/03/2016   CO2 26 08/03/2016   TSH 4.746 (H) 08/01/2016   INR 1.00 07/31/2016      Assessment / Plan:   Likely maliganat Pleural effusion  With clinical stage IV lung cancer  Continue current medical therapy per primary. Plan for VATs, talc pleurodesis, and pleural biopsy with [name] on 08/06/2016 . The procedure was explained in detail to the patient and family at bedside. All questions were answered to the patient's satisfaction.   Nicholes Rough, PA-C 08/03/2016 4:06 PM   I have seen and examined the patient and reviewed history. I have discussed with him likely  diagnosis  I have recommended bronch left VATS pleural biopsy , pleurix cath  and poss talc pleurodesis  Monday afternoon  [name] MD      [address] [place] [address] Office [phone]   [place]

## 2016-08-04 LAB — BLOOD GAS, ARTERIAL
Acid-Base Excess: 1.2 mmol/L (ref 0.0–2.0)
Bicarbonate: 24.4 mmol/L (ref 20.0–28.0)
Drawn by: 437071
O2 Saturation: 96.2 %
Patient temperature: 98.5
pCO2 arterial: 33 mmHg (ref 32.0–48.0)
pH, Arterial: 7.481 — ABNORMAL HIGH (ref 7.350–7.450)
pO2, Arterial: 77.7 mmHg — ABNORMAL LOW (ref 83.0–108.0)

## 2016-08-04 LAB — CBC
HCT: 40.9 % (ref 39.0–52.0)
HEMATOCRIT: 39.6 % (ref 39.0–52.0)
HEMOGLOBIN: 13.3 g/dL (ref 13.0–17.0)
Hemoglobin: 13.6 g/dL (ref 13.0–17.0)
MCH: 31.2 pg (ref 26.0–34.0)
MCH: 31.1 pg (ref 26.0–34.0)
MCHC: 33.6 g/dL (ref 30.0–36.0)
MCHC: 33.3 g/dL (ref 30.0–36.0)
MCV: 93 fL (ref 78.0–100.0)
MCV: 93.4 fL (ref 78.0–100.0)
Platelets: 228 10*3/uL (ref 150–400)
Platelets: 220 10*3/uL (ref 150–400)
RBC: 4.26 MIL/uL (ref 4.22–5.81)
RBC: 4.38 MIL/uL (ref 4.22–5.81)
RDW: 13.5 % (ref 11.5–15.5)
RDW: 13.5 % (ref 11.5–15.5)
WBC: 14.7 10*3/uL — AB (ref 4.0–10.5)
WBC: 14.6 10*3/uL — ABNORMAL HIGH (ref 4.0–10.5)

## 2016-08-04 LAB — BASIC METABOLIC PANEL
ANION GAP: 7 (ref 5–15)
BUN: 11 mg/dL (ref 6–20)
CHLORIDE: 99 mmol/L — AB (ref 101–111)
CO2: 27 mmol/L (ref 22–32)
Calcium: 8.5 mg/dL — ABNORMAL LOW (ref 8.9–10.3)
Creatinine, Ser: 0.8 mg/dL (ref 0.61–1.24)
GFR calc non Af Amer: >60 mL/min (ref >60)
Glucose, Bld: 115 mg/dL — ABNORMAL HIGH (ref 65–99)
Potassium: 3.9 mmol/L (ref 3.5–5.1)
Sodium: 133 mmol/L — ABNORMAL LOW (ref 135–145)

## 2016-08-04 LAB — URINALYSIS, ROUTINE W REFLEX MICROSCOPIC
Bilirubin Urine: NEGATIVE
Glucose, UA: NEGATIVE mg/dL
Hgb urine dipstick: NEGATIVE
Ketones, ur: NEGATIVE mg/dL
Leukocytes, UA: NEGATIVE
Nitrite: NEGATIVE
Protein, ur: NEGATIVE mg/dL
Specific Gravity, Urine: 1.009 (ref 1.005–1.030)
pH: 6 (ref 5.0–8.0)

## 2016-08-04 LAB — COMPREHENSIVE METABOLIC PANEL
ALT: 10 U/L — ABNORMAL LOW (ref 17–63)
AST: 12 U/L — ABNORMAL LOW (ref 15–41)
Albumin: 2.5 g/dL — ABNORMAL LOW (ref 3.5–5.0)
Alkaline Phosphatase: 57 U/L (ref 38–126)
Anion gap: 8 (ref 5–15)
BUN: 11 mg/dL (ref 6–20)
CO2: 27 mmol/L (ref 22–32)
Calcium: 8.5 mg/dL — ABNORMAL LOW (ref 8.9–10.3)
Chloride: 98 mmol/L — ABNORMAL LOW (ref 101–111)
Creatinine, Ser: 0.8 mg/dL (ref 0.61–1.24)
GFR calc Af Amer: >60 mL/min (ref >60)
GFR calc non Af Amer: >60 mL/min (ref >60)
Glucose, Bld: 108 mg/dL — ABNORMAL HIGH (ref 65–99)
Potassium: 4.3 mmol/L (ref 3.5–5.1)
Sodium: 133 mmol/L — ABNORMAL LOW (ref 135–145)
Total Bilirubin: 0.7 mg/dL (ref 0.3–1.2)
Total Protein: 5.6 g/dL — ABNORMAL LOW (ref 6.5–8.1)

## 2016-08-04 LAB — TYPE AND SCREEN
ABO/RH(D): A POS
Antibody Screen: NEGATIVE

## 2016-08-04 LAB — PROTIME-INR
INR: 1.24
Prothrombin Time: 15.6 seconds — ABNORMAL HIGH (ref 11.4–15.2)

## 2016-08-04 LAB — EXPECTORATED SPUTUM ASSESSMENT W GRAM STAIN, RFLX TO RESP C

## 2016-08-04 LAB — BODY FLUID CULTURE
Culture: NO GROWTH
Special Requests: NORMAL

## 2016-08-04 LAB — HIV ANTIBODY (ROUTINE TESTING W REFLEX): HIV SCREEN 4TH GENERATION: NONREACTIVE

## 2016-08-04 LAB — LEGIONELLA PNEUMOPHILA SEROGP 1 UR AG: L. PNEUMOPHILA SEROGP 1 UR AG: NEGATIVE

## 2016-08-04 LAB — APTT: aPTT: 36 seconds (ref 24–36)

## 2016-08-04 LAB — ABO/RH: ABO/RH(D): A POS

## 2016-08-04 NOTE — Progress Notes (Signed)
PROGRESS NOTE    Jeremy Chan  JSH:[id number] DOB: [birth year] DOA: 07/31/2016 PCP: Haze Rushing, MD    Brief Narrative:  Patient is 65y old male with history of obstructive sleep apnea, prostate cancer, hypertension, hyperlipidemia, coronary artery disease presented to the ED with progressive worsening shortness of breath and occasional chest pain for the past few weeks. Patient noted to have a large left-sided pleural effusion. Pulmonary/critical care consulted and patient underwent a thoracentesis. Cytology from first thoracentesis with atypical cells. Repeat thoracentesis cytology pending. T CTS consulted and patient for VATS, talc pleurodesis and pleural biopsy per [name] on 08/06/2016.    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 ?? Parapneumonic effusion. Patient status post diagnostic and therapeutic paracentesis per pulmonary with 2.1 L of serosanguineous dark orange blood-tinged pleural fluid obtained. Fluid cultures pending. By light's criteria likely an exudate. Cytology with atypical cells present. Patient was seen by pulmonary and patient underwent another thoracentesis with 600 mL of bloody pleural fluid obtained and sent for pathology (08/02/2016). Repeat chest x-ray negative for pneumothorax. CT chest ordered per pulmonary for further evaluation with bilateral lung nodules, persistent moderate left pleural effusion with left lower lobe consolidation, no pneumothorax. Due to consolidation noted on CT chest and leukocytosis, patient started empirically on IV antibiotics. Patient being followed by pulmonary. Cardiothoracic surgery was consulted on the patient until patient likely has a malignant pleural effusion with concern for  possible clinical stage IV lung cancer. Patient scheduled for VATs, talc pleurodesis, and pleural biopsy with [name] on 08/06/2016 .  Pulmonary and TCTS following and appreciate input and recommendations.  #2  probable community acquired pneumonia Per CT chest 08/02/2016. Patient with a large left pleural effusion and left lower lobe consolidation noted on CT chest. Patient also with a leukocytosis. Sputum Gram stain and cultures pending. Urine strep pneumococcus antigen negative. Urine Legionella antigen negative. Continue empiric IV Rocephin and IV azithromycin.  Pulmonary following.   #3 hypertension Stable. Continue home regimen of Norvasc, benazepril.  #4 hyperlipidemia Continue statin.  #5 chest pain Likely secondary to problem #1. Cardiac enzymes negative 3. Clinical improvement. Follow.    DVT prophylaxis: SCDs Code Status: Full Family Communication: Updated patient and daughter at bedside. Disposition Plan: Home when clinically improved and pending cardiothoracic evaluation and procedures.    Consultants:   PCCM/pulmonary 07/31/2016 Dr. Jimmey Ralph  TCTS: Dr Servando Snare 08/03/2016  Procedures:   Diagnostic and therapeutic thoracentesis per Dr. Jimmey Ralph 08/01/2016  Chest x-ray 08/01/2016, 07/31/2016, 08/02/2016 Diagnostic and therapeutic thoracentesis per Dr. Elsworth Soho 08/02/2016 Ct chest  08/02/2016   Antimicrobials:   None   Subjective: Patient sleeping easily arousable. No chest pain. Shortness of breath improved. Patient states supposed to have a VATS procedure done by cardiothoracic surgery on Monday.   Objective: Vitals:   08/03/16 1409 08/03/16 1603 08/03/16 2014 08/04/16 0500  BP: (!) 100/55 129/64 (!) 110/55 (!) 115/58  Pulse: 82 90 87 80  Resp: 18  17 18   Temp: 98 F (36.7 C)  98.5 F (36.9 C) 98.2 F (36.8 C)  TempSrc: Oral  Oral Oral  SpO2: 97% 97% 97% 96%  Weight:      Height:        Intake/Output Summary (Last 24 hours) at 08/04/16  1312 Last  data filed at 08/03/16 1420  Gross per 24 hour  Intake              250 ml  Output                0 ml  Net              250 ml   Filed Weights   08/01/16 0439 08/02/16 0345 08/03/16 0356  Weight: 96.6 kg (212 lb 14.4 oz) 97.1 kg (214 lb 1.1 oz) 97 kg (213 lb 14.4 oz)    Examination:  General exam: Appears calm and comfortable  Respiratory system: Decreased breath sounds in the left base up to a third of the lungs. No wheezing.  Respiratory effort normal. Cardiovascular system: S1 & S2 heard, RRR. No JVD, murmurs, rubs, gallops or clicks. No pedal edema. Gastrointestinal system: Abdomen is nondistended, soft and nontender. No organomegaly or masses felt. Normal bowel sounds heard. Central nervous system: Alert and oriented. No focal neurological deficits. Extremities: Symmetric 5 x 5 power. Skin: No rashes, lesions or ulcers Psychiatry: Judgement and insight appear normal. Mood & affect appropriate.     Data Reviewed: I have personally reviewed following labs and imaging studies  CBC:  Recent Labs Lab 07/31/16 1815 08/01/16 0604 08/02/16 0315 08/03/16 0205 08/04/16 0204 08/04/16 0446  WBC 12.1* 10.6* 14.1* 14.9* 14.7* 14.6*  NEUTROABS 8.3*  --  10.3* 10.8*  --   --   HGB 14.6 13.9 14.0 14.1 13.3 13.6  HCT 43.9 43.2 43.3 42.5 39.6 40.9  MCV 95.0 94.9 94.3 93.4 93.0 93.4  PLT 271 247 229 224 228 732   Basic Metabolic Panel:  Recent Labs Lab 08/01/16 0604 08/02/16 0315 08/03/16 0205 08/04/16 0204 08/04/16 0446  NA 137 135 130* 133* 133*  K 4.0 4.1 3.7 3.9 4.3  CL 104 100* 98* 99* 98*  CO2 27 28 26 27 27   GLUCOSE 101* 118* 138* 115* 108*  BUN 16 12 11 11 11   CREATININE 0.68 0.75 0.71 0.80 0.80  CALCIUM 8.7* 8.5* 8.3* 8.5* 8.5*  MG 2.0  --   --   --   --   PHOS 3.1  --   --   --   --    GFR: Estimated Creatinine Clearance: 88.8 mL/min (by C-G formula based on SCr of 0.8 mg/dL). Liver Function Tests:  Recent Labs Lab 07/31/16 1815  08/01/16 0006 08/01/16 0604 08/02/16 0315 08/04/16 0446  AST 18  --  14* 15 12*  ALT 12*  --  10* 11* 10*  ALKPHOS 83  --  77 73 57  BILITOT 0.8  --  0.9 1.1 0.7  PROT 6.6  --  5.7* 5.3* 5.6*  ALBUMIN 3.8 3.6 3.1* 2.8* 2.5*   No results for input(s): LIPASE, AMYLASE in the last 168 hours. No results for input(s): AMMONIA in the last 168 hours. Coagulation Profile:  Recent Labs Lab 07/31/16 2122 08/04/16 0446  INR 1.00 1.24   Cardiac Enzymes:  Recent Labs Lab 08/01/16 0006 08/01/16 0604 08/01/16 1054  TROPONINI <0.03 <0.03 <0.03   BNP (last 3 results) No results for input(s): PROBNP in the last 8760 hours. HbA1C: No results for input(s): HGBA1C in the last 72 hours. CBG:  Recent Labs Lab 08/01/16 0629  GLUCAP 102*   Lipid Profile: No results for input(s): CHOL, HDL, LDLCALC, TRIG, CHOLHDL, LDLDIRECT in the last 72 hours. Thyroid Function Tests: No results for input(s): TSH, T4TOTAL, FREET4, T3FREE, THYROIDAB in the  last 72 hours. Anemia Panel: No results for input(s): VITAMINB12, FOLATE, FERRITIN, TIBC, IRON, RETICCTPCT in the last 72 hours. Sepsis Labs: No results for input(s): PROCALCITON, LATICACIDVEN in the last 168 hours.  Recent Results (from the past 240 hour(s))  Fungus Culture With Stain     Status: None (Preliminary result)   Collection Time: 07/31/16 11:59 PM  Result Value Ref Range Status   Fungus Stain Final report  Final    Comment: (NOTE) Performed At: [facility] [place], [place] [id number] Lindon Romp MD VQ:[id number]    Fungus (Mycology) Culture PENDING  Incomplete   Fungal Source PLEURAL  Final  Fungus Culture Result     Status: None   Collection Time: 07/31/16 11:59 PM  Result Value Ref Range Status   Result 1 Comment  Final    Comment: (NOTE) KOH/Calcofluor preparation:  no fungus observed. Performed At: [facility] [place], [place] [id number] Lindon Romp MD  YK:[id number]   Body fluid culture (includes gram stain)     Status: None   Collection Time: 08/01/16 12:13 AM  Result Value Ref Range Status   Specimen Description PLEURAL FLUID  Final   Special Requests Normal  Final   Gram Stain   Final    RARE WBC PRESENT,BOTH PMN AND MONONUCLEAR NO ORGANISMS SEEN    Culture NO GROWTH 3 DAYS  Final   Report Status 08/04/2016 FINAL  Final  Culture, sputum-assessment     Status: None   Collection Time: 08/03/16 10:40 AM  Result Value Ref Range Status   Specimen Description EXPECTORATED SPUTUM  Final   Special Requests NONE  Final   Sputum evaluation THIS SPECIMEN IS ACCEPTABLE FOR SPUTUM CULTURE  Final   Report Status 08/04/2016 FINAL  Final  Culture, respiratory (NON-Expectorated)     Status: None (Preliminary result)   Collection Time: 08/03/16 10:40 AM  Result Value Ref Range Status   Specimen Description EXPECTORATED SPUTUM  Final   Special Requests NONE Reflexed from [id number]  Final   Gram Stain   Final    ABUNDANT WBC PRESENT,BOTH PMN AND MONONUCLEAR FEW GRAM POSITIVE COCCI IN PAIRS FEW GRAM NEGATIVE RODS RARE GRAM POSITIVE RODS    Culture PENDING  Incomplete   Report Status PENDING  Incomplete         Radiology Studies: Ct Chest Wo Contrast  Result Date: 08/02/2016 CLINICAL DATA:  Shortness of breath, known left pleural effusion EXAM: CT CHEST WITHOUT CONTRAST TECHNIQUE: Multidetector CT imaging of the chest was performed following the standard protocol without IV contrast. COMPARISON:  08/02/2016 FINDINGS: Cardiovascular: Somewhat limited due to the lack of IV contrast. Aortic calcifications are noted. No aneurysmal dilatation is noted. Coronary calcifications are seen. No significant cardiac enlargement is noted. Mediastinum/Nodes: The esophagus is within normal limits. Scattered small mediastinal and hilar lymph nodes are seen. None of these are significant by size criteria. Lungs/Pleura: Moderate left pleural effusion remains  similar to that seen on prior plain film examination. Considerable consolidation within the lower lobe is seen. Few small subpleural nodules are noted measuring less than 5 mm. Some patchy changes are noted in the left upper lobe which may be related to some re-expansion edema. A pleural-based nodule measuring 11 mm is noted on the left on image number 37 of series 8. The right lung shows some subpleural nodules. The largest of these measures 11 mm in the right lower lobe on image number 95 of series 8. Upper Abdomen: No  acute abnormality is noted in the upper abdomen. Musculoskeletal: Degenerative changes of the thoracic spine are seen. IMPRESSION: Persistent moderate left pleural effusion with left lower lobe consolidation. Some mild re-expansion edema is noted in the right upper lobe. No pneumothorax is noted. Multiple bilateral lung nodules. The largest of these measures 11 mm bilaterally. Non-contrast chest CT at 3-6 months is recommended. If the nodules are stable at time of repeat CT, then future CT at 18-24 months (from today's scan) is considered optional for low-risk patients, but is recommended for high-risk patients. This recommendation follows the consensus statement: Guidelines for Management of Incidental Pulmonary Nodules Detected on CT Images: From the Fleischner Society 2017; Radiology 2017; 284:228-243. These results will be called to the ordering clinician or representative by the Radiologist Assistant, and communication documented in the PACS or zVision Dashboard. Aortic Atherosclerosis (ICD10-I70.0). Electronically Signed   By: Inez Catalina M.D.   On: 08/02/2016 16:05        Scheduled Meds: . amLODipine  5 mg Oral Daily  . atorvastatin  40 mg Oral q1800  . benazepril  20 mg Oral Daily  . guaiFENesin  600 mg Oral BID  . senna-docusate  1 tablet Oral BID  . sodium chloride flush  3 mL Intravenous Q12H  . sodium chloride flush  3 mL Intravenous Q12H   Continuous Infusions: .  sodium chloride    . azithromycin 500 mg (08/04/16 1105)  . cefTRIAXone (ROCEPHIN)  IV Stopped (08/04/16 1040)     LOS: 3 days    Time spent: 52 minutes    THOMPSON,DANIEL, MD [facility] Pager [phone]  If 7PM-7AM, please contact night-coverage [email or web address] Password [email or web address] 08/04/2016, 1:12 PM

## 2016-08-05 LAB — SURGICAL PCR SCREEN
MRSA, PCR: NEGATIVE
Staphylococcus aureus: NEGATIVE

## 2016-08-05 MED ORDER — DEXTROSE 5 % IV SOLN
1.5000 g | INTRAVENOUS | Status: AC
  Administered 2016-08-06: 1.5 g via INTRAVENOUS
  Filled 2016-08-05: qty 1.5

## 2016-08-05 NOTE — Progress Notes (Signed)
PROGRESS NOTE    Jeremy Chan  ZOX:[id number] DOB: 1941-01-20 DOA: 07/31/2016 PCP: Haze Rushing, MD    Brief Narrative:  Patient is 5y old male with history of obstructive sleep apnea, prostate cancer, hypertension, hyperlipidemia, coronary artery disease presented to the ED with progressive worsening shortness of breath and occasional chest pain for the past few weeks. Patient noted to have a large left-sided pleural effusion. Pulmonary/critical care consulted and patient underwent a thoracentesis. Cytology from first thoracentesis with atypical cells. Repeat thoracentesis cytology pending. T CTS consulted and patient for VATS, talc pleurodesis and pleural biopsy per [name] on 08/06/2016.    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 ?? Parapneumonic effusion. Patient status post diagnostic and therapeutic paracentesis per pulmonary with 2.1 L of serosanguineous dark orange blood-tinged pleural fluid obtained. Fluid cultures pending. By light's criteria likely an exudate. Cytology with atypical cells present. Patient was seen by pulmonary and patient underwent another thoracentesis with 600 mL of bloody pleural fluid obtained and sent for pathology (08/02/2016). Repeat chest x-ray negative for pneumothorax. CT chest ordered per pulmonary for further evaluation with bilateral lung nodules, persistent moderate left pleural effusion with left lower lobe consolidation, no pneumothorax. Due to consolidation noted on CT chest and leukocytosis, patient started empirically on IV antibiotics. Patient being followed by pulmonary. Cardiothoracic surgery was consulted on the patient until patient likely has a malignant pleural effusion with concern for  possible clinical stage IV lung cancer. Patient scheduled for VATs, talc pleurodesis, and pleural biopsy with [name] on 08/06/2016 .  Pulmonary and TCTS following and appreciate input and recommendations.  #2  probable community acquired pneumonia Per CT chest 08/02/2016. Patient with a large left pleural effusion and left lower lobe consolidation noted on CT chest. Patient also with a leukocytosis. Sputum Gram stain and cultures pending. Urine strep pneumococcus antigen negative. Urine Legionella antigen negative. Continue empiric IV Rocephin and IV azithromycin.  Pulmonary following.   #3 hypertension Stable. Continue home regimen of Norvasc, benazepril.  #4 hyperlipidemia Continue statin.  #5 chest pain Likely secondary to problem #1. Cardiac enzymes negative 3. Clinical improvement. Follow.    DVT prophylaxis: SCDs Code Status: Full Family Communication: Updated patient and daughter at bedside. Disposition Plan: Home when clinically improved and pending cardiothoracic evaluation and procedures.    Consultants:   PCCM/pulmonary 07/31/2016 Dr. Jimmey Ralph  TCTS: Dr Servando Snare 08/03/2016  Procedures:   Diagnostic and therapeutic thoracentesis per Dr. Jimmey Ralph 08/01/2016  Chest x-ray 08/01/2016, 07/31/2016, 08/02/2016 Diagnostic and therapeutic thoracentesis per Dr. Elsworth Soho 08/02/2016 Ct chest  08/02/2016   Antimicrobials:   None   Subjective: Patient in bed. Patient states sat in the chair. No chest pain. Shortness of breath improved. Patient somewhat anxious about diagnosis and VATS procedure done by cardiothoracic surgery on Monday.   Objective: Vitals:   08/04/16 0500 08/04/16 1320 08/04/16 2045 08/05/16 0420  BP: (!) 115/58 (!) 110/58 102/62 112/64  Pulse: 80 89 80 83  Resp: 18 18 18 18   Temp: 98.2 F (36.8 C) 98.8 F (37.1 C) 98 F (36.7 C) 98.5 F (36.9 C)  TempSrc: Oral Oral Oral Oral  SpO2: 96% 94% 96% 94%  Weight:    97.1 kg (214 lb 1.6 oz)  Height:  Intake/Output Summary (Last 24 hours) at 08/05/16 1127 Last data filed at 08/05/16 0653  Gross per 24 hour  Intake                0 ml  Output              950 ml  Net             -950 ml   Filed Weights   08/02/16 0345 08/03/16 0356 08/05/16 0420  Weight: 97.1 kg (214 lb 1.1 oz) 97 kg (213 lb 14.4 oz) 97.1 kg (214 lb 1.6 oz)    Examination:  General exam: Appears calm and comfortable  Respiratory system: Decreased breath sounds in the left base. No wheezing.  Respiratory effort normal. Cardiovascular system: S1 & S2 heard, RRR. No JVD, murmurs, rubs, gallops or clicks. No pedal edema. Gastrointestinal system: Abdomen is nondistended, soft and nontender. No organomegaly or masses felt. Normal bowel sounds heard. Central nervous system: Alert and oriented. No focal neurological deficits. Extremities: Symmetric 5 x 5 power. Skin: No rashes, lesions or ulcers Psychiatry: Judgement and insight appear normal. Mood & affect appropriate.     Data Reviewed: I have personally reviewed following labs and imaging studies  CBC:  Recent Labs Lab 07/31/16 1815 08/01/16 0604 08/02/16 0315 08/03/16 0205 08/04/16 0204 08/04/16 0446  WBC 12.1* 10.6* 14.1* 14.9* 14.7* 14.6*  NEUTROABS 8.3*  --  10.3* 10.8*  --   --   HGB 14.6 13.9 14.0 14.1 13.3 13.6  HCT 43.9 43.2 43.3 42.5 39.6 40.9  MCV 95.0 94.9 94.3 93.4 93.0 93.4  PLT 271 247 229 224 228 893   Basic Metabolic Panel:  Recent Labs Lab 08/01/16 0604 08/02/16 0315 08/03/16 0205 08/04/16 0204 08/04/16 0446  NA 137 135 130* 133* 133*  K 4.0 4.1 3.7 3.9 4.3  CL 104 100* 98* 99* 98*  CO2 27 28 26 27 27   GLUCOSE 101* 118* 138* 115* 108*  BUN 16 12 11 11 11   CREATININE 0.68 0.75 0.71 0.80 0.80  CALCIUM 8.7* 8.5* 8.3* 8.5* 8.5*  MG 2.0  --   --   --   --   PHOS 3.1  --   --   --   --    GFR: Estimated Creatinine Clearance: 88.9 mL/min (by C-G formula based on SCr of 0.8 mg/dL). Liver Function Tests:  Recent  Labs Lab 07/31/16 1815 08/01/16 0006 08/01/16 0604 08/02/16 0315 08/04/16 0446  AST 18  --  14* 15 12*  ALT 12*  --  10* 11* 10*  ALKPHOS 83  --  77 73 57  BILITOT 0.8  --  0.9 1.1 0.7  PROT 6.6  --  5.7* 5.3* 5.6*  ALBUMIN 3.8 3.6 3.1* 2.8* 2.5*   No results for input(s): LIPASE, AMYLASE in the last 168 hours. No results for input(s): AMMONIA in the last 168 hours. Coagulation Profile:  Recent Labs Lab 07/31/16 2122 08/04/16 0446  INR 1.00 1.24   Cardiac Enzymes:  Recent Labs Lab 08/01/16 0006 08/01/16 0604 08/01/16 1054  TROPONINI <0.03 <0.03 <0.03   BNP (last 3 results) No results for input(s): PROBNP in the last 8760 hours. HbA1C: No results for input(s): HGBA1C in the last 72 hours. CBG:  Recent Labs Lab 08/01/16 0629  GLUCAP 102*   Lipid Profile: No results for input(s): CHOL, HDL, LDLCALC, TRIG, CHOLHDL, LDLDIRECT in the last 72 hours. Thyroid Function Tests: No results for input(s): TSH, T4TOTAL, FREET4, T3FREE, THYROIDAB in  the last 72 hours. Anemia Panel: No results for input(s): VITAMINB12, FOLATE, FERRITIN, TIBC, IRON, RETICCTPCT in the last 72 hours. Sepsis Labs: No results for input(s): PROCALCITON, LATICACIDVEN in the last 168 hours.  Recent Results (from the past 240 hour(s))  Fungus Culture With Stain     Status: None (Preliminary result)   Collection Time: 07/31/16 11:59 PM  Result Value Ref Range Status   Fungus Stain Final report  Final    Comment: (NOTE) Performed At: [facility] [place], [place] [id number] Lindon Romp MD KV:[id number]    Fungus (Mycology) Culture PENDING  Incomplete   Fungal Source PLEURAL  Final  Fungus Culture Result     Status: None   Collection Time: 07/31/16 11:59 PM  Result Value Ref Range Status   Result 1 Comment  Final    Comment: (NOTE) KOH/Calcofluor preparation:  no fungus observed. Performed At: [facility] [place], [place] [id number] Lindon Romp MD AC:[id number]   Body fluid culture (includes gram stain)     Status: None   Collection Time: 08/01/16 12:13 AM  Result Value Ref Range Status   Specimen Description PLEURAL FLUID  Final   Special Requests Normal  Final   Gram Stain   Final    RARE WBC PRESENT,BOTH PMN AND MONONUCLEAR NO ORGANISMS SEEN    Culture NO GROWTH 3 DAYS  Final   Report Status 08/04/2016 FINAL  Final  Culture, sputum-assessment     Status: None   Collection Time: 08/03/16 10:40 AM  Result Value Ref Range Status   Specimen Description EXPECTORATED SPUTUM  Final   Special Requests NONE  Final   Sputum evaluation THIS SPECIMEN IS ACCEPTABLE FOR SPUTUM CULTURE  Final   Report Status 08/04/2016 FINAL  Final  Culture, respiratory (NON-Expectorated)     Status: None (Preliminary result)   Collection Time: 08/03/16 10:40 AM  Result Value Ref Range Status   Specimen Description EXPECTORATED SPUTUM  Final   Special Requests NONE Reflexed from [id number]  Final   Gram Stain   Final    ABUNDANT WBC PRESENT,BOTH PMN AND MONONUCLEAR FEW GRAM POSITIVE COCCI IN PAIRS FEW GRAM NEGATIVE RODS RARE GRAM POSITIVE RODS    Culture CULTURE REINCUBATED FOR BETTER GROWTH  Final   Report Status PENDING  Incomplete  Culture, blood (routine x 2) Call MD if unable to obtain prior to antibiotics being given     Status: None (Preliminary result)   Collection Time: 08/03/16 11:41 AM  Result Value Ref Range Status   Specimen Description BLOOD LEFT HAND  Final   Special Requests IN PEDIATRIC BOTTLE Blood Culture adequate volume  Final   Culture NO GROWTH 1 DAY  Final   Report Status PENDING  Incomplete  Culture, blood (routine x 2) Call MD if unable to obtain prior to antibiotics being given     Status: None (Preliminary result)   Collection Time: 08/03/16 11:41 AM  Result Value Ref Range Status   Specimen Description BLOOD RIGHT ANTECUBITAL  Final   Special Requests IN PEDIATRIC BOTTLE Blood Culture adequate volume  Final     Culture NO GROWTH 1 DAY  Final   Report Status PENDING  Incomplete         Radiology Studies: No results found.      Scheduled Meds: . amLODipine  5 mg Oral Daily  . atorvastatin  40 mg Oral q1800  . benazepril  20 mg Oral Daily  . guaiFENesin  600 mg Oral BID  . senna-docusate  1 tablet Oral BID  . sodium chloride flush  3 mL Intravenous Q12H  . sodium chloride flush  3 mL Intravenous Q12H   Continuous Infusions: . sodium chloride    . azithromycin 500 mg (08/05/16 1014)  . cefTRIAXone (ROCEPHIN)  IV 1 g (08/05/16 1000)     LOS: 4 days    Time spent: 87 minutes    Colleene Swarthout, MD [facility] Pager [phone]  If 7PM-7AM, please contact night-coverage [email or web address] Password [email or web address] 08/05/2016, 11:27 AM

## 2016-08-06 ENCOUNTER — Encounter (HOSPITAL_COMMUNITY)
Admission: EM | Disposition: A | Discharge status: Home Health | Payer: Self-pay | Source: Home / Self Care | Attending: Internal Medicine

## 2016-08-06 ENCOUNTER — Inpatient Hospital Stay (HOSPITAL_COMMUNITY): Payer: Medicare Other | Admitting: Anesthesiology

## 2016-08-06 ENCOUNTER — Inpatient Hospital Stay (HOSPITAL_COMMUNITY): Payer: Medicare Other

## 2016-08-06 ENCOUNTER — Encounter (HOSPITAL_COMMUNITY): Payer: Self-pay | Admitting: Orthopedic Surgery

## 2016-08-06 ENCOUNTER — Other Ambulatory Visit (HOSPITAL_COMMUNITY)
Admission: RE | Admit: 2016-08-06 | Discharge: 2016-08-06 | Disposition: A | Discharge status: Home / Self Care | Payer: Medicare Other | Source: Ambulatory Visit | Attending: Internal Medicine | Admitting: Internal Medicine

## 2016-08-06 DIAGNOSIS — J910 Malignant pleural effusion: Secondary | ICD-10-CM

## 2016-08-06 DIAGNOSIS — C3492 Malignant neoplasm of unspecified part of left bronchus or lung: Secondary | ICD-10-CM | POA: Insufficient documentation

## 2016-08-06 HISTORY — PX: CHEST TUBE INSERTION: SHX231

## 2016-08-06 HISTORY — PX: VIDEO BRONCHOSCOPY: SHX5072

## 2016-08-06 HISTORY — PX: VIDEO ASSISTED THORACOSCOPY: SHX5073

## 2016-08-06 HISTORY — PX: PLEURAL BIOPSY: SHX5082

## 2016-08-06 HISTORY — PX: TALC PLEURODESIS: SHX2506

## 2016-08-06 LAB — BASIC METABOLIC PANEL
ANION GAP: 7 (ref 5–15)
BUN: 10 mg/dL (ref 6–20)
CHLORIDE: 101 mmol/L (ref 101–111)
CO2: 25 mmol/L (ref 22–32)
Calcium: 8.3 mg/dL — ABNORMAL LOW (ref 8.9–10.3)
Creatinine, Ser: 0.67 mg/dL (ref 0.61–1.24)
GFR calc Af Amer: >60 mL/min (ref >60)
GFR calc non Af Amer: >60 mL/min (ref >60)
Glucose, Bld: 115 mg/dL — ABNORMAL HIGH (ref 65–99)
POTASSIUM: 4.3 mmol/L (ref 3.5–5.1)
SODIUM: 133 mmol/L — AB (ref 135–145)

## 2016-08-06 LAB — CBC
HCT: 39.2 % (ref 39.0–52.0)
HEMOGLOBIN: 12.9 g/dL — AB (ref 13.0–17.0)
MCH: 30.4 pg (ref 26.0–34.0)
MCHC: 32.9 g/dL (ref 30.0–36.0)
MCV: 92.2 fL (ref 78.0–100.0)
PLATELETS: 270 10*3/uL (ref 150–400)
RBC: 4.25 MIL/uL (ref 4.22–5.81)
RDW: 13.4 % (ref 11.5–15.5)
WBC: 12.6 10*3/uL — AB (ref 4.0–10.5)

## 2016-08-06 LAB — CULTURE, RESPIRATORY W GRAM STAIN: Culture: NORMAL

## 2016-08-06 LAB — CULTURE, RESPIRATORY

## 2016-08-06 SURGERY — VIDEO ASSISTED THORACOSCOPY
Anesthesia: General | Site: Chest

## 2016-08-06 MED ORDER — PROPOFOL 10 MG/ML IV BOLUS
INTRAVENOUS | Status: DC | PRN
  Administered 2016-08-06: 125 mg via INTRAVENOUS

## 2016-08-06 MED ORDER — HYDROMORPHONE HCL 1 MG/ML IJ SOLN
0.2500 mg | INTRAMUSCULAR | Status: DC | PRN
  Administered 2016-08-06: 0.5 mg via INTRAVENOUS

## 2016-08-06 MED ORDER — PHENYLEPHRINE HCL 10 MG/ML IJ SOLN
INTRAVENOUS | Status: DC | PRN
  Administered 2016-08-06: 50 ug/min via INTRAVENOUS
  Administered 2016-08-06: 25 ug/min via INTRAVENOUS

## 2016-08-06 MED ORDER — LIDOCAINE HCL (CARDIAC) 20 MG/ML IV SOLN
INTRAVENOUS | Status: DC | PRN
  Administered 2016-08-06: 30 mg via INTRAVENOUS

## 2016-08-06 MED ORDER — SODIUM CHLORIDE 0.45 % IV SOLN
INTRAVENOUS | Status: DC
  Administered 2016-08-06 – 2016-08-07 (×2): via INTRAVENOUS

## 2016-08-06 MED ORDER — MIDAZOLAM HCL 2 MG/2ML IJ SOLN
INTRAMUSCULAR | Status: AC
  Filled 2016-08-06: qty 2

## 2016-08-06 MED ORDER — SODIUM CHLORIDE 0.9% FLUSH: 9.0000 mL | INTRAVENOUS | Status: DC | PRN

## 2016-08-06 MED ORDER — SUFENTANIL CITRATE 50 MCG/ML IV SOLN
INTRAVENOUS | Status: DC | PRN
  Administered 2016-08-06: 15 ug via INTRAVENOUS
  Administered 2016-08-06 (×3): 5 ug via INTRAVENOUS

## 2016-08-06 MED ORDER — PHENYLEPHRINE HCL 10 MG/ML IJ SOLN
INTRAMUSCULAR | Status: DC | PRN
  Administered 2016-08-06 (×3): 80 ug via INTRAVENOUS

## 2016-08-06 MED ORDER — LACTATED RINGERS IV SOLN
INTRAVENOUS | Status: DC
  Administered 2016-08-06: 14:00:00 via INTRAVENOUS

## 2016-08-06 MED ORDER — BISACODYL 5 MG PO TBEC
10.0000 mg | DELAYED_RELEASE_TABLET | Freq: Every day | ORAL | Status: DC
  Administered 2016-08-07 – 2016-08-10 (×4): 10 mg via ORAL
  Filled 2016-08-06 (×4): qty 2

## 2016-08-06 MED ORDER — OXYCODONE HCL 5 MG PO TABS: 5.0000 mg | ORAL_TABLET | Freq: Once | ORAL | Status: DC | PRN

## 2016-08-06 MED ORDER — ACETAMINOPHEN 160 MG/5ML PO SOLN
1000.0000 mg | Freq: Four times a day (QID) | ORAL | Status: DC
  Filled 2016-08-06: qty 40.6

## 2016-08-06 MED ORDER — MORPHINE SULFATE 2 MG/ML IV SOLN
INTRAVENOUS | Status: DC
  Administered 2016-08-06: 19:00:00 via INTRAVENOUS
  Administered 2016-08-07: 4.5 mg via INTRAVENOUS
  Administered 2016-08-07: 3 mg via INTRAVENOUS
  Administered 2016-08-07: 1.5 mg via INTRAVENOUS
  Administered 2016-08-07: 6 mL via INTRAVENOUS
  Administered 2016-08-07: 1.5 mg via INTRAVENOUS
  Administered 2016-08-08: 3 mg via INTRAVENOUS
  Filled 2016-08-06 (×2): qty 30

## 2016-08-06 MED ORDER — SENNOSIDES-DOCUSATE SODIUM 8.6-50 MG PO TABS: 1.0000 | ORAL_TABLET | Freq: Every day | ORAL | Status: DC

## 2016-08-06 MED ORDER — ONDANSETRON HCL 4 MG/2ML IJ SOLN
INTRAMUSCULAR | Status: DC | PRN
  Administered 2016-08-06: 4 mg via INTRAVENOUS

## 2016-08-06 MED ORDER — TRAMADOL HCL 50 MG PO TABS
50.0000 mg | ORAL_TABLET | Freq: Four times a day (QID) | ORAL | Status: DC | PRN
  Administered 2016-08-07: 100 mg via ORAL
  Filled 2016-08-06: qty 2

## 2016-08-06 MED ORDER — TALC 5 G PL SUSR
INTRAPLEURAL | Status: DC | PRN
  Administered 2016-08-06: 4 g via INTRAPLEURAL

## 2016-08-06 MED ORDER — OXYCODONE HCL 5 MG PO TABS
5.0000 mg | ORAL_TABLET | ORAL | Status: DC | PRN
  Administered 2016-08-06 – 2016-08-08 (×4): 10 mg via ORAL
  Filled 2016-08-06 (×4): qty 2

## 2016-08-06 MED ORDER — ONDANSETRON HCL 4 MG/2ML IJ SOLN: 4.0000 mg | Freq: Four times a day (QID) | INTRAMUSCULAR | Status: DC | PRN

## 2016-08-06 MED ORDER — ACETAMINOPHEN 500 MG PO TABS
1000.0000 mg | ORAL_TABLET | Freq: Four times a day (QID) | ORAL | Status: DC
  Administered 2016-08-06 – 2016-08-09 (×13): 1000 mg via ORAL
  Filled 2016-08-06 (×13): qty 2

## 2016-08-06 MED ORDER — 0.9 % SODIUM CHLORIDE (POUR BTL) OPTIME
TOPICAL | Status: DC | PRN
  Administered 2016-08-06: 2000 mL

## 2016-08-06 MED ORDER — DIPHENHYDRAMINE HCL 12.5 MG/5ML PO ELIX: 12.5000 mg | ORAL_SOLUTION | Freq: Four times a day (QID) | ORAL | Status: DC | PRN

## 2016-08-06 MED ORDER — POTASSIUM CHLORIDE 10 MEQ/50ML IV SOLN: 10.0000 meq | Freq: Every day | INTRAVENOUS | Status: DC | PRN

## 2016-08-06 MED ORDER — NALOXONE HCL 0.4 MG/ML IJ SOLN: 0.4000 mg | INTRAMUSCULAR | Status: DC | PRN

## 2016-08-06 MED ORDER — SUGAMMADEX SODIUM 200 MG/2ML IV SOLN
INTRAVENOUS | Status: DC | PRN
  Administered 2016-08-06: 192.8 mg via INTRAVENOUS

## 2016-08-06 MED ORDER — DIPHENHYDRAMINE HCL 50 MG/ML IJ SOLN: 12.5000 mg | Freq: Four times a day (QID) | INTRAMUSCULAR | Status: DC | PRN

## 2016-08-06 MED ORDER — LEVALBUTEROL HCL 0.63 MG/3ML IN NEBU
0.6300 mg | INHALATION_SOLUTION | Freq: Four times a day (QID) | RESPIRATORY_TRACT | Status: DC
  Administered 2016-08-06 – 2016-08-07 (×2): 0.63 mg via RESPIRATORY_TRACT
  Filled 2016-08-06 (×4): qty 3

## 2016-08-06 MED ORDER — PROPOFOL 10 MG/ML IV BOLUS
INTRAVENOUS | Status: AC
  Filled 2016-08-06: qty 40

## 2016-08-06 MED ORDER — OXYCODONE HCL 5 MG/5ML PO SOLN: 5.0000 mg | Freq: Once | ORAL | Status: DC | PRN

## 2016-08-06 MED ORDER — DEXTROSE 5 % IV SOLN
1.5000 g | Freq: Two times a day (BID) | INTRAVENOUS | Status: DC
  Administered 2016-08-07: 1.5 g via INTRAVENOUS
  Filled 2016-08-06 (×2): qty 1.5

## 2016-08-06 MED ORDER — HYDROMORPHONE HCL 1 MG/ML IJ SOLN
INTRAMUSCULAR | Status: AC
  Administered 2016-08-06: 0.5 mg via INTRAVENOUS
  Filled 2016-08-06: qty 0.5

## 2016-08-06 MED ORDER — LACTATED RINGERS IV SOLN
INTRAVENOUS | Status: DC | PRN
  Administered 2016-08-06 (×2): via INTRAVENOUS

## 2016-08-06 MED ORDER — ROCURONIUM BROMIDE 100 MG/10ML IV SOLN
INTRAVENOUS | Status: DC | PRN
  Administered 2016-08-06: 10 mg via INTRAVENOUS
  Administered 2016-08-06: 50 mg via INTRAVENOUS

## 2016-08-06 MED ORDER — MIDAZOLAM HCL 5 MG/5ML IJ SOLN
INTRAMUSCULAR | Status: DC | PRN
  Administered 2016-08-06: 1 mg via INTRAVENOUS

## 2016-08-06 MED ORDER — SUFENTANIL CITRATE 50 MCG/ML IV SOLN
INTRAVENOUS | Status: AC
  Filled 2016-08-06: qty 1

## 2016-08-06 SURGICAL SUPPLY — 66 items
ADH SKN CLS APL DERMABOND .7 (GAUZE/BANDAGES/DRESSINGS) ×2
APL SRG 22X2 LUM MLBL SLNT (VASCULAR PRODUCTS)
APL SRG 7X2 LUM MLBL SLNT (VASCULAR PRODUCTS)
APPLICATOR TIP COSEAL (VASCULAR PRODUCTS) IMPLANT
APPLICATOR TIP EXT COSEAL (VASCULAR PRODUCTS) IMPLANT
BLADE SURG 11 STRL SS (BLADE) IMPLANT
CANISTER SUCT 3000ML PPV (MISCELLANEOUS) ×3 IMPLANT
CATH KIT ON Q 5IN SLV (PAIN MANAGEMENT) IMPLANT
CATH ROBINSON RED A/P 18FR (CATHETERS) ×1 IMPLANT
CATH THORACIC 28FR (CATHETERS) ×1 IMPLANT
CATH THORACIC 36FR (CATHETERS) IMPLANT
CATH THORACIC 36FR RT ANG (CATHETERS) IMPLANT
CLEANER TIP ELECTROSURG 2X2 (MISCELLANEOUS) ×3 IMPLANT
CLIP TI MEDIUM 6 (CLIP) IMPLANT
CONT SPEC 4OZ CLIKSEAL STRL BL (MISCELLANEOUS) ×7 IMPLANT
DERMABOND ADVANCED (GAUZE/BANDAGES/DRESSINGS) ×1
DERMABOND ADVANCED .7 DNX12 (GAUZE/BANDAGES/DRESSINGS) IMPLANT
DRAPE LAPAROSCOPIC ABDOMINAL (DRAPES) ×3 IMPLANT
DRAPE WARM FLUID 44X44 (DRAPE) ×3 IMPLANT
DRILL BIT 7/64X5 (BIT) IMPLANT
ELECT BLADE 4.0 EZ CLEAN MEGAD (MISCELLANEOUS) ×3
ELECT BLADE 6.5 EXT (BLADE) ×1 IMPLANT
ELECT REM PT RETURN 9FT ADLT (ELECTROSURGICAL) ×3
ELECTRODE BLDE 4.0 EZ CLN MEGD (MISCELLANEOUS) ×2 IMPLANT
ELECTRODE REM PT RTRN 9FT ADLT (ELECTROSURGICAL) ×2 IMPLANT
GAUZE SPONGE 4X4 12PLY STRL (GAUZE/BANDAGES/DRESSINGS) ×3 IMPLANT
GLOVE BIO SURGEON STRL SZ 6.5 (GLOVE) ×6 IMPLANT
GOWN STRL REUS W/ TWL LRG LVL3 (GOWN DISPOSABLE) ×6 IMPLANT
GOWN STRL REUS W/TWL LRG LVL3 (GOWN DISPOSABLE) ×9
KIT BASIN OR (CUSTOM PROCEDURE TRAY) ×3 IMPLANT
KIT ROOM TURNOVER OR (KITS) ×3 IMPLANT
KIT SUCTION CATH 14FR (SUCTIONS) ×3 IMPLANT
NS IRRIG 1000ML POUR BTL (IV SOLUTION) ×6 IMPLANT
PACK CHEST (CUSTOM PROCEDURE TRAY) ×3 IMPLANT
PAD ARMBOARD 7.5X6 YLW CONV (MISCELLANEOUS) ×6 IMPLANT
PASSER SUT SWANSON 36MM LOOP (INSTRUMENTS) IMPLANT
SEALANT PROGEL (MISCELLANEOUS) IMPLANT
SEALANT SURG COSEAL 4ML (VASCULAR PRODUCTS) IMPLANT
SEALANT SURG COSEAL 8ML (VASCULAR PRODUCTS) IMPLANT
SOLUTION ANTI FOG 6CC (MISCELLANEOUS) ×3 IMPLANT
SUT ETHILON 3 0 FSL (SUTURE) ×1 IMPLANT
SUT PROLENE 3 0 SH DA (SUTURE) IMPLANT
SUT PROLENE 4 0 RB 1 (SUTURE)
SUT PROLENE 4-0 RB1 .5 CRCL 36 (SUTURE) IMPLANT
SUT SILK  1 MH (SUTURE) ×4
SUT SILK 1 MH (SUTURE) ×8 IMPLANT
SUT SILK 2 0SH CR/8 30 (SUTURE) IMPLANT
SUT SILK 3 0SH CR/8 30 (SUTURE) IMPLANT
SUT VIC AB 1 CTX 18 (SUTURE) IMPLANT
SUT VIC AB 1 CTX 36 (SUTURE)
SUT VIC AB 1 CTX36XBRD ANBCTR (SUTURE) IMPLANT
SUT VIC AB 2-0 CTX 36 (SUTURE) IMPLANT
SUT VIC AB 2-0 UR6 27 (SUTURE) ×1 IMPLANT
SUT VIC AB 3-0 X1 27 (SUTURE) ×1 IMPLANT
SUT VICRYL 2 TP 1 (SUTURE) IMPLANT
SWAB COLLECTION DEVICE MRSA (MISCELLANEOUS) IMPLANT
SWAB CULTURE ESWAB REG 1ML (MISCELLANEOUS) IMPLANT
SYSTEM SAHARA CHEST DRAIN ATS (WOUND CARE) ×3 IMPLANT
TAPE CLOTH 4X10 WHT NS (GAUZE/BANDAGES/DRESSINGS) ×3 IMPLANT
TIP APPLICATOR SPRAY EXTEND 16 (VASCULAR PRODUCTS) IMPLANT
TOWEL OR 17X24 6PK STRL BLUE (TOWEL DISPOSABLE) ×3 IMPLANT
TOWEL OR 17X26 10 PK STRL BLUE (TOWEL DISPOSABLE) ×6 IMPLANT
TRAP SPECIMEN MUCOUS 40CC (MISCELLANEOUS) IMPLANT
TRAY FOLEY CATH SILVER 16FR LF (SET/KITS/TRAYS/PACK) ×3 IMPLANT
TROCAR BLADELESS 12MM (ENDOMECHANICALS) IMPLANT
WATER STERILE IRR 1000ML POUR (IV SOLUTION) ×6 IMPLANT

## 2016-08-06 NOTE — Anesthesia Preprocedure Evaluation (Signed)
Anesthesia Evaluation  Patient identified by MRN, date of birth, ID band Patient awake    Reviewed: Allergy & Precautions, H&P , NPO status , Patient's Chart, lab work & pertinent test results  Airway Mallampati: II   Neck ROM: full    Dental   Pulmonary shortness of breath, former smoker,  Left pleural effusion.  parapneumonic vs malignant   + rhonchi  + decreased breath sounds      Cardiovascular hypertension,  Rhythm:regular Rate:Normal     Neuro/Psych    GI/Hepatic GERD  ,  Endo/Other    Renal/GU      Musculoskeletal   Abdominal   Peds  Hematology   Anesthesia Other Findings   Reproductive/Obstetrics                             Anesthesia Physical Anesthesia Plan  ASA: III  Anesthesia Plan: General   Post-op Pain Management:    Induction: Intravenous  PONV Risk Score and Plan: 3 and Ondansetron, Dexamethasone, Propofol and Midazolam  Airway Management Planned: Double Lumen EBT  Additional Equipment: Arterial line  Intra-op Plan:   Post-operative Plan: Extubation in OR  Informed Consent: I have reviewed the patients History and Physical, chart, labs and discussed the procedure including the risks, benefits and alternatives for the proposed anesthesia with the patient or authorized representative who has indicated his/her understanding and acceptance.     Plan Discussed with: CRNA, Anesthesiologist and Surgeon  Anesthesia Plan Comments:         Anesthesia Quick Evaluation

## 2016-08-06 NOTE — Anesthesia Procedure Notes (Signed)
Central Venous Catheter Insertion Performed by: Marcie Bal Hurschel Paynter, anesthesiologist Start/End7/03/2016 3:01 PM, 08/06/2016 3:13 PM Patient location: Pre-op. Preanesthetic checklist: patient identified, IV checked, site marked, risks and benefits discussed, surgical consent, monitors and equipment checked, pre-op evaluation, timeout performed and anesthesia consent Position: Trendelenburg Lidocaine 1% used for infiltration and patient sedated Hand hygiene performed , maximum sterile barriers used  and Seldinger technique used Catheter size: 7 Fr Central line was placed.Double lumen Procedure performed using ultrasound guided technique. Ultrasound Notes:anatomy identified, needle tip was noted to be adjacent to the nerve/plexus identified and no ultrasound evidence of intravascular and/or intraneural injection Attempts: 1 Following insertion, line sutured, dressing applied and Biopatch. Post procedure assessment: blood return through all ports, free fluid flow and no air  Patient tolerated the procedure well with no immediate complications.

## 2016-08-06 NOTE — Transfer of Care (Signed)
Immediate Anesthesia Transfer of Care Note  Patient: ASHISH ROSSETTI  Procedure(s) Performed: Procedure(s): VIDEO ASSISTED THORACOSCOPY, left (Left) TALC PLEURADESIS (Left) PLEURAL BIOPSY (Left) VIDEO BRONCHOSCOPY (N/A) INSERTION PLEURAL DRAINAGE CATHETER, left (Left)  Patient Location: PACU  Anesthesia Type:General  Level of Consciousness: awake and alert   Airway & Oxygen Therapy: Patient Spontanous Breathing and Patient connected to nasal cannula oxygen  Post-op Assessment: Report given to RN and Post -op Vital signs reviewed and stable  Post vital signs: Reviewed and stable  Last Vitals:  Vitals:   08/06/16 0426 08/06/16 1307  BP: 106/61 111/61  Pulse: 72 79  Resp: 18 18  Temp: 36.7 C 36.7 C    Last Pain:  Vitals:   08/06/16 1307  TempSrc: Oral  PainSc:       Patients Stated Pain Goal: 2 (01/60/10 9323)  Complications: No apparent anesthesia complications

## 2016-08-06 NOTE — Care Management Important Message (Signed)
Important Message  Patient Details  Name: Jeremy Chan MRN: [id number] Date of Birth: 05/04/1940   Medicare Important Message Given:  Yes    Nathen May 08/06/2016, 10:15 AM

## 2016-08-06 NOTE — Anesthesia Procedure Notes (Signed)
Procedure Name: Intubation Date/Time: 08/06/2016 3:51 PM Performed by: Eligha Bridegroom Pre-anesthesia Checklist: Timeout performed, Patient being monitored, Suction available, Emergency Drugs available and Patient identified Patient Re-evaluated:Patient Re-evaluated prior to inductionOxygen Delivery Method: Circle system utilized Preoxygenation: Pre-oxygenation with 100% oxygen Intubation Type: IV induction Ventilation: Oral airway inserted - appropriate to patient size Laryngoscope Size: Mac and 4 Grade View: Grade I Endobronchial tube: Left, EBT position confirmed by auscultation, Double lumen EBT and EBT position confirmed by fiberoptic bronchoscope and 41 Fr Number of attempts: 1 Airway Equipment and Method: Stylet Placement Confirmation: ETT inserted through vocal cords under direct vision,  positive ETCO2 and breath sounds checked- equal and bilateral Tube secured with: Tape Dental Injury: Teeth and Oropharynx as per pre-operative assessment

## 2016-08-06 NOTE — Brief Op Note (Signed)
07/31/2016 - 08/06/2016  5:19 PM  PATIENT:  Jeremy Chan  76 y.o. male  PRE-OPERATIVE DIAGNOSIS:  LARGE LEFT EFFUSION  POST-OPERATIVE DIAGNOSIS:  LARGE LEFT EFFUSION  PROCEDURE:  Procedure(s) : VIDEO ASSISTED THORACOSCOPY, left (Left) TALC PLEURADESIS (Left) PLEURAL BIOPSY (Left) VIDEO BRONCHOSCOPY (N/A) INSERTION PLEURAL DRAINAGE CATHETER, left (Left)  SURGEON:  Surgeon(s) and Role:    * [name], MD - Primary  PHYSICIAN ASSISTANT: Migel Hannis PA-C  ANESTHESIA:   general  EBL:  Total I/O In: 1000 [I.V.:1000] Out: -   BLOOD ADMINISTERED:none  DRAINS: Pleur-x drainage catheter, 28 straight chest tube   LOCAL MEDICATIONS USED:  NONE  SPECIMEN:  Source of Specimen:  pleural biopsies  DISPOSITION OF SPECIMEN:  PATHOLOGY  COUNTS:  YES  TOURNIQUET:  * No tourniquets in log *  DICTATION: .Dragon Dictation  PLAN OF CARE: Admit to inpatient   PATIENT DISPOSITION:  ICU - extubated and stable.   Delay start of Pharmacological VTE agent (>24hrs) due to surgical blood loss or risk of bleeding: yes

## 2016-08-06 NOTE — Progress Notes (Signed)
Pre Procedure note for inpatients:   Jeremy Chan has been scheduled for Procedure(s): VIDEO ASSISTED THORACOSCOPY (Left) TALC PLEURADESIS (Left) PLEURAL BIOPSY (Left) today. The various methods of treatment have been discussed with the patient. After consideration of the risks, benefits and treatment options the patient has consented to the planned procedure.   The patient has been seen and labs reviewed. There are no changes in the patient's condition to prevent proceeding with the planned procedure today.  Recent labs:  Lab Results  Component Value Date   WBC 12.6 (H) 08/06/2016   HGB 12.9 (L) 08/06/2016   HCT 39.2 08/06/2016   PLT 270 08/06/2016   GLUCOSE 115 (H) 08/06/2016   ALT 10 (L) 08/04/2016   AST 12 (L) 08/04/2016   NA 133 (L) 08/06/2016   K 4.3 08/06/2016   CL 101 08/06/2016   CREATININE 0.67 08/06/2016   BUN 10 08/06/2016   CO2 25 08/06/2016   TSH 4.746 (H) 08/01/2016   INR 1.24 08/04/2016   Path from last week: Adequacy Reason Satisfactory For Evaluation. Diagnosis PLEURAL FLUID, LEFT (SPECIMEN 1 OF 1, COLLECTED 08/02/16): RARE ATYPICAL CELLS SUSPICIOUS FOR CARCINOMA. SEE COMMENT. Claudette Laws MD Pathologist, Electronic Signature (Case signed 08/06/2016)  [name], MD 08/06/2016 3:13 PM

## 2016-08-06 NOTE — Progress Notes (Signed)
PROGRESS NOTE    Jeremy Chan  OJJ:[id number] DOB: February 19, 1940 DOA: 07/31/2016 PCP: Haze Rushing, MD    Brief Narrative:  Patient is 76y old male with history of obstructive sleep apnea, prostate cancer, hypertension, hyperlipidemia, coronary artery disease presented to the ED with progressive worsening shortness of breath and occasional chest pain for the past few weeks. Patient noted to have a large left-sided pleural effusion. Pulmonary/critical care consulted and patient underwent a thoracentesis. Cytology from first thoracentesis with atypical cells. Repeat thoracentesis cytology pending. T CTS consulted and patient for VATS, talc pleurodesis and pleural biopsy per [name] on 08/06/2016.    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 ?? Parapneumonic effusion vs malignant effusion. Patient status post diagnostic and therapeutic paracentesis per pulmonary with 2.1 L of serosanguineous dark orange blood-tinged pleural fluid obtained. Fluid cultures pending. By light's criteria likely an exudate. Cytology with atypical cells present. Patient was seen by pulmonary and patient underwent another thoracentesis with 600 mL of bloody pleural fluid obtained and sent for cytology/pathology (08/02/2016) which is pending. Repeat chest x-ray negative for pneumothorax. CT chest ordered per pulmonary for further evaluation with bilateral lung nodules, persistent moderate left pleural effusion with left lower lobe consolidation, no pneumothorax. Due to consolidation noted on CT chest and leukocytosis, patient started empirically on IV antibiotics. Patient being followed by pulmonary. Cardiothoracic surgery was consulted on the patient. 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 Patient scheduled for VATs, talc pleurodesis, and pleural biopsy with [name] today 08/06/2016 .  Pulmonary and TCTS following and appreciate input and recommendations.  #2  probable community acquired pneumonia Per CT chest 08/02/2016. Patient with a large left pleural effusion and left lower lobe consolidation noted on CT chest. Patient also with a leukocytosis. Sputum Gram stain and cultures pending. Urine strep pneumococcus antigen negative. Urine Legionella antigen negative. Continue empiric IV Rocephin and IV azithromycin.  Pulmonary following.   #3 hypertension Stable. Continue current home regimen of Norvasc, benazepril.  #4 hyperlipidemia Continue statin.  #5 chest pain Likely secondary to problem #1. Cardiac enzymes negative 3. Clinical improvement. Follow.    DVT prophylaxis: SCDs Code Status: Full Family Communication: Updated patient. No family at bedside. Disposition Plan: Home pending cardiothoracic evaluation and procedures.    Consultants:   PCCM/pulmonary 07/31/2016 Dr. Jimmey Ralph  TCTS: Dr Servando Snare 08/03/2016  Procedures:   Diagnostic and therapeutic thoracentesis per Dr. Jimmey Ralph 08/01/2016  Chest x-ray 08/01/2016, 07/31/2016, 08/02/2016 Diagnostic and therapeutic thoracentesis per Dr. Elsworth Soho 08/02/2016 Ct chest  08/02/2016   Antimicrobials:   None   Subjective: Patient in bed. Patient delivered a bit anxious about today's procedure. Patient with complaints of chest pain on the left side which he states is now going into his abdomen. Patient states has been having chest pain however wasn't significant enough for him to complaining about it. Shortness of breath improving.  Objective: Vitals:   08/04/16 2045 08/05/16 0420 08/05/16 1333 08/06/16 0426  BP: 102/62 112/64 (!) 106/58 106/61  Pulse: 80 83 79 72  Resp: 18 18 18 18   Temp: 98 F (36.7 C) 98.5  F (36.9 C) 98.3 F (36.8 C) 98 F (36.7 C)  TempSrc: Oral Oral Oral  Oral  SpO2: 96% 94% 95% 95%  Weight:  97.1 kg (214 lb 1.6 oz)  96.4 kg (212 lb 9.6 oz)  Height:        Intake/Output Summary (Last 24 hours) at 08/06/16 1024 Last data filed at 08/06/16 0655  Gross per 24 hour  Intake                0 ml  Output              600 ml  Net             -600 ml   Filed Weights   08/03/16 0356 08/05/16 0420 08/06/16 0426  Weight: 97 kg (213 lb 14.4 oz) 97.1 kg (214 lb 1.6 oz) 96.4 kg (212 lb 9.6 oz)    Examination:  General exam: Appears calm and comfortable  Respiratory system: Decreased breath sounds in the left base up to 1/2 way up lungs. No wheezing.  Respiratory effort normal. Cardiovascular system: S1 & S2 heard, RRR. No JVD, murmurs, rubs, gallops or clicks. No pedal edema. Gastrointestinal system: Abdomen is nondistended, soft and nontender. No organomegaly or masses felt. Normal bowel sounds heard. Central nervous system: Alert and oriented. No focal neurological deficits. Extremities: Symmetric 5 x 5 power. Skin: No rashes, lesions or ulcers Psychiatry: Judgement and insight appear normal. Mood & affect appropriate.     Data Reviewed: I have personally reviewed following labs and imaging studies  CBC:  Recent Labs Lab 07/31/16 1815  08/02/16 0315 08/03/16 0205 08/04/16 0204 08/04/16 0446 08/06/16 0323  WBC 12.1*  < > 14.1* 14.9* 14.7* 14.6* 12.6*  NEUTROABS 8.3*  --  10.3* 10.8*  --   --   --   HGB 14.6  < > 14.0 14.1 13.3 13.6 12.9*  HCT 43.9  < > 43.3 42.5 39.6 40.9 39.2  MCV 95.0  < > 94.3 93.4 93.0 93.4 92.2  PLT 271  < > 229 224 228 220 270  < > = values in this interval not displayed. Basic Metabolic Panel:  Recent Labs Lab 08/01/16 0604 08/02/16 0315 08/03/16 0205 08/04/16 0204 08/04/16 0446 08/06/16 0323  NA 137 135 130* 133* 133* 133*  K 4.0 4.1 3.7 3.9 4.3 4.3  CL 104 100* 98* 99* 98* 101  CO2 27 28 26 27 27 25   GLUCOSE 101* 118*  138* 115* 108* 115*  BUN 16 12 11 11 11 10   CREATININE 0.68 0.75 0.71 0.80 0.80 0.67  CALCIUM 8.7* 8.5* 8.3* 8.5* 8.5* 8.3*  MG 2.0  --   --   --   --   --   PHOS 3.1  --   --   --   --   --    GFR: Estimated Creatinine Clearance: 88.6 mL/min (by C-G formula based on SCr of 0.67 mg/dL). Liver Function Tests:  Recent Labs Lab 07/31/16 1815 08/01/16 0006 08/01/16 0604 08/02/16 0315 08/04/16 0446  AST 18  --  14* 15 12*  ALT 12*  --  10* 11* 10*  ALKPHOS 83  --  77 73 57  BILITOT 0.8  --  0.9 1.1 0.7  PROT 6.6  --  5.7* 5.3* 5.6*  ALBUMIN 3.8 3.6 3.1* 2.8* 2.5*   No results for input(s): LIPASE, AMYLASE in the last 168 hours. No results for input(s): AMMONIA in the last 168 hours. Coagulation Profile:  Recent Labs Lab 07/31/16 2122 08/04/16 0446  INR 1.00 1.24   Cardiac Enzymes:  Recent Labs  Lab 08/01/16 0006 08/01/16 0604 08/01/16 1054  TROPONINI <0.03 <0.03 <0.03   BNP (last 3 results) No results for input(s): PROBNP in the last 8760 hours. HbA1C: No results for input(s): HGBA1C in the last 72 hours. CBG:  Recent Labs Lab 08/01/16 0629  GLUCAP 102*   Lipid Profile: No results for input(s): CHOL, HDL, LDLCALC, TRIG, CHOLHDL, LDLDIRECT in the last 72 hours. Thyroid Function Tests: No results for input(s): TSH, T4TOTAL, FREET4, T3FREE, THYROIDAB in the last 72 hours. Anemia Panel: No results for input(s): VITAMINB12, FOLATE, FERRITIN, TIBC, IRON, RETICCTPCT in the last 72 hours. Sepsis Labs: No results for input(s): PROCALCITON, LATICACIDVEN in the last 168 hours.  Recent Results (from the past 240 hour(s))  Fungus Culture With Stain     Status: None (Preliminary result)   Collection Time: 07/31/16 11:59 PM  Result Value Ref Range Status   Fungus Stain Final report  Final    Comment: (NOTE) Performed At: [facility] [place], [place] [id number] Lindon Romp MD NW:[id number]    Fungus (Mycology) Culture PENDING   Incomplete   Fungal Source PLEURAL  Final  Fungus Culture Result     Status: None   Collection Time: 07/31/16 11:59 PM  Result Value Ref Range Status   Result 1 Comment  Final    Comment: (NOTE) KOH/Calcofluor preparation:  no fungus observed. Performed At: [facility] [place], [place] [id number] Lindon Romp MD BM:[id number]   Body fluid culture (includes gram stain)     Status: None   Collection Time: 08/01/16 12:13 AM  Result Value Ref Range Status   Specimen Description PLEURAL FLUID  Final   Special Requests Normal  Final   Gram Stain   Final    RARE WBC PRESENT,BOTH PMN AND MONONUCLEAR NO ORGANISMS SEEN    Culture NO GROWTH 3 DAYS  Final   Report Status 08/04/2016 FINAL  Final  Culture, sputum-assessment     Status: None   Collection Time: 08/03/16 10:40 AM  Result Value Ref Range Status   Specimen Description EXPECTORATED SPUTUM  Final   Special Requests NONE  Final   Sputum evaluation THIS SPECIMEN IS ACCEPTABLE FOR SPUTUM CULTURE  Final   Report Status 08/04/2016 FINAL  Final  Culture, respiratory (NON-Expectorated)     Status: None (Preliminary result)   Collection Time: 08/03/16 10:40 AM  Result Value Ref Range Status   Specimen Description EXPECTORATED SPUTUM  Final   Special Requests NONE Reflexed from [id number]  Final   Gram Stain   Final    ABUNDANT WBC PRESENT,BOTH PMN AND MONONUCLEAR FEW GRAM POSITIVE COCCI IN PAIRS FEW GRAM NEGATIVE RODS RARE GRAM POSITIVE RODS    Culture CULTURE REINCUBATED FOR BETTER GROWTH  Final   Report Status PENDING  Incomplete  Culture, blood (routine x 2) Call MD if unable to obtain prior to antibiotics being given     Status: None (Preliminary result)   Collection Time: 08/03/16 11:41 AM  Result Value Ref Range Status   Specimen Description BLOOD LEFT HAND  Final   Special Requests IN PEDIATRIC BOTTLE Blood Culture adequate volume  Final   Culture NO GROWTH 2 DAYS  Final   Report Status PENDING   Incomplete  Culture, blood (routine x 2) Call MD if unable to obtain prior to antibiotics being given     Status: None (Preliminary result)   Collection Time: 08/03/16 11:41 AM  Result Value Ref Range Status   Specimen Description BLOOD  RIGHT ANTECUBITAL  Final   Special Requests IN PEDIATRIC BOTTLE Blood Culture adequate volume  Final   Culture NO GROWTH 2 DAYS  Final   Report Status PENDING  Incomplete  Surgical pcr screen     Status: None   Collection Time: 08/05/16  9:27 PM  Result Value Ref Range Status   MRSA, PCR NEGATIVE NEGATIVE Final   Staphylococcus aureus NEGATIVE NEGATIVE Final    Comment:        The Xpert SA Assay (FDA approved for NASAL specimens in patients over 85 years of age), is one component of a comprehensive surveillance program.  Test performance has been validated by [facility] for patients greater than or equal to 73 year old. It is not intended to diagnose infection nor to guide or monitor treatment.          Radiology Studies: Dg Chest 2 View  Result Date: 08/06/2016 CLINICAL DATA:  76 year old male with pleural effusion. Preoperative exam. Subsequent encounter. EXAM: CHEST  2 VIEW COMPARISON:  08/02/2016 CT and chest x-ray. FINDINGS: Moderate size left-sided pleural effusion with left lower lobe and lingular consolidation. CT detected pulmonary nodules not well demonstrated by plain film exam. No pneumothorax. Heart size within normal limits.  Calcified aorta. Ankylosis portion of thoracic spine with prominent osteophyte. IMPRESSION: Moderate size left-sided pleural effusion with left lower lobe and lingular consolidation. CT detected pulmonary nodules not well demonstrated by plain film exam. Electronically Signed   By: Genia Del M.D.   On: 08/06/2016 07:37        Scheduled Meds: . amLODipine  5 mg Oral Daily  . atorvastatin  40 mg Oral q1800  . benazepril  20 mg Oral Daily  . guaiFENesin  600 mg Oral BID  . senna-docusate  1 tablet  Oral BID  . sodium chloride flush  3 mL Intravenous Q12H  . sodium chloride flush  3 mL Intravenous Q12H   Continuous Infusions: . sodium chloride    . azithromycin 500 mg (08/05/16 1014)  . cefTRIAXone (ROCEPHIN)  IV 1 g (08/05/16 1000)  . cefUROXime (ZINACEF)  IV       LOS: 5 days    Time spent: 8 minutes    THOMPSON,DANIEL, MD [facility] Pager [phone]  If 7PM-7AM, please contact night-coverage [email or web address] Password [facility] 08/06/2016, 10:24 AM

## 2016-08-07 ENCOUNTER — Encounter (HOSPITAL_COMMUNITY): Payer: Self-pay | Admitting: Cardiothoracic Surgery

## 2016-08-07 ENCOUNTER — Inpatient Hospital Stay (HOSPITAL_COMMUNITY): Payer: Medicare Other

## 2016-08-07 DIAGNOSIS — Z9689 Presence of other specified functional implants: Secondary | ICD-10-CM

## 2016-08-07 DIAGNOSIS — D62 Acute posthemorrhagic anemia: Secondary | ICD-10-CM

## 2016-08-07 DIAGNOSIS — J939 Pneumothorax, unspecified: Secondary | ICD-10-CM | POA: Diagnosis not present

## 2016-08-07 LAB — BASIC METABOLIC PANEL
ANION GAP: 8 (ref 5–15)
BUN: 8 mg/dL (ref 6–20)
CALCIUM: 8.2 mg/dL — AB (ref 8.9–10.3)
CHLORIDE: 98 mmol/L — AB (ref 101–111)
CO2: 27 mmol/L (ref 22–32)
CREATININE: 0.66 mg/dL (ref 0.61–1.24)
GFR calc non Af Amer: >60 mL/min (ref >60)
Glucose, Bld: 104 mg/dL — ABNORMAL HIGH (ref 65–99)
Potassium: 4.2 mmol/L (ref 3.5–5.1)
SODIUM: 133 mmol/L — AB (ref 135–145)

## 2016-08-07 LAB — BLOOD GAS, ARTERIAL
Acid-Base Excess: 1.3 mmol/L (ref 0.0–2.0)
Bicarbonate: 24.9 mmol/L (ref 20.0–28.0)
Drawn by: 36496
O2 CONTENT: 2 L/min
O2 SAT: 96.6 %
Patient temperature: 98.2
pCO2 arterial: 35.8 mmHg (ref 32.0–48.0)
pH, Arterial: 7.455 — ABNORMAL HIGH (ref 7.350–7.450)
pO2, Arterial: 87.2 mmHg (ref 83.0–108.0)

## 2016-08-07 LAB — CBC WITH DIFFERENTIAL/PLATELET
BASOS ABS: 0 10*3/uL (ref 0.0–0.1)
BASOS PCT: 0 %
EOS PCT: 1 %
Eosinophils Absolute: 0.1 10*3/uL (ref 0.0–0.7)
HCT: 33.8 % — ABNORMAL LOW (ref 39.0–52.0)
Hemoglobin: 11 g/dL — ABNORMAL LOW (ref 13.0–17.0)
LYMPHS PCT: 10 %
Lymphs Abs: 1.2 10*3/uL (ref 0.7–4.0)
MCH: 30.1 pg (ref 26.0–34.0)
MCHC: 32.5 g/dL (ref 30.0–36.0)
MCV: 92.6 fL (ref 78.0–100.0)
Monocytes Absolute: 1.7 10*3/uL — ABNORMAL HIGH (ref 0.1–1.0)
Monocytes Relative: 14 %
NEUTROS ABS: 9.2 10*3/uL — AB (ref 1.7–7.7)
Neutrophils Relative %: 75 %
PLATELETS: 232 10*3/uL (ref 150–400)
RBC: 3.65 MIL/uL — AB (ref 4.22–5.81)
RDW: 13.4 % (ref 11.5–15.5)
WBC: 12.2 10*3/uL — AB (ref 4.0–10.5)

## 2016-08-07 MED ORDER — ENOXAPARIN SODIUM 30 MG/0.3ML ~~LOC~~ SOLN
30.0000 mg | SUBCUTANEOUS | Status: DC
  Administered 2016-08-07 – 2016-08-10 (×4): 30 mg via SUBCUTANEOUS
  Filled 2016-08-07 (×4): qty 0.3

## 2016-08-07 MED ORDER — LEVALBUTEROL HCL 0.63 MG/3ML IN NEBU: 0.6300 mg | INHALATION_SOLUTION | Freq: Four times a day (QID) | RESPIRATORY_TRACT | Status: DC | PRN

## 2016-08-07 MED ORDER — SENNOSIDES-DOCUSATE SODIUM 8.6-50 MG PO TABS: 1.0000 | ORAL_TABLET | Freq: Two times a day (BID) | ORAL | Status: DC

## 2016-08-07 MED ORDER — SENNOSIDES-DOCUSATE SODIUM 8.6-50 MG PO TABS
1.0000 | ORAL_TABLET | Freq: Two times a day (BID) | ORAL | Status: DC
  Administered 2016-08-07 – 2016-08-10 (×6): 1 via ORAL
  Filled 2016-08-07 (×6): qty 1

## 2016-08-07 NOTE — Anesthesia Postprocedure Evaluation (Signed)
Anesthesia Post Note  Patient: Jeremy Chan  Procedure(s) Performed: Procedure(s) (LRB): VIDEO ASSISTED THORACOSCOPY, left (Left) TALC PLEURADESIS (Left) PLEURAL BIOPSY (Left) VIDEO BRONCHOSCOPY (N/A) INSERTION PLEURAL DRAINAGE CATHETER, left (Left)     Patient location during evaluation: PACU Anesthesia Type: General Level of consciousness: awake and alert and patient cooperative Pain management: pain level controlled Vital Signs Assessment: post-procedure vital signs reviewed and stable Respiratory status: spontaneous breathing and respiratory function stable Cardiovascular status: stable Anesthetic complications: no    Last Vitals:  Vitals:   08/07/16 0314 08/07/16 0316  BP:  (!) 119/58  Pulse:  81  Resp: 15 13  Temp:  36.8 C    Last Pain:  Vitals:   08/07/16 0637  TempSrc:   PainSc: Asleep                 Shelbia Scinto S

## 2016-08-07 NOTE — Progress Notes (Signed)
Patient ID: Jeremy Chan, male   DOB: 03-25-1940, 76 y.o.   MRN: [id number] TCTS DAILY ICU PROGRESS NOTE                   [address]            [place], [address]          [phone]   1 Day Post-Op Procedure(s) (LRB): VIDEO ASSISTED THORACOSCOPY, left (Left) TALC PLEURADESIS (Left) PLEURAL BIOPSY (Left) VIDEO BRONCHOSCOPY (N/A) INSERTION PLEURAL DRAINAGE CATHETER, left (Left)  Total Length of Stay:  LOS: 6 days   Subjective: Awake alert in chair   Objective: Vital signs in last 24 hours: Temp:  [97.5 F (36.4 C)-99 F (37.2 C)] 98.2 F (36.8 C) (07/03 0316) Pulse Rate:  [76-88] 81 (07/03 0316) Cardiac Rhythm: Normal sinus rhythm (07/03 0000) Resp:  [13-21] 13 (07/03 0316) BP: (111-142)/(53-78) 119/58 (07/03 0316) SpO2:  [93 %-99 %] 98 % (07/03 0316) Arterial Line BP: (128-157)/(57-77) 128/59 (07/03 0200) Weight:  [213 lb 10 oz (96.9 kg)] 213 lb 10 oz (96.9 kg) (07/03 0600)  Filed Weights   08/05/16 0420 08/06/16 0426 08/07/16 0600  Weight: 214 lb 1.6 oz (97.1 kg) 212 lb 9.6 oz (96.4 kg) 213 lb 10 oz (96.9 kg)    Weight change: 1 lb 0.4 oz (0.465 kg)   Hemodynamic parameters for last 24 hours:    Intake/Output from previous day: 07/02 0701 - 07/03 0700 In: 2640.7 [P.O.:360; I.V.:2280.7] Out: 2110 [Urine:1825; Blood:15; Chest Tube:270]  Intake/Output this shift: No intake/output data recorded.  Current Meds: Scheduled Meds: . acetaminophen  1,000 mg Oral Q6H   Or  . acetaminophen (TYLENOL) oral liquid 160 mg/5 mL  1,000 mg Oral Q6H  . amLODipine  5 mg Oral Daily  . atorvastatin  40 mg Oral q1800  . benazepril  20 mg Oral Daily  . bisacodyl  10 mg Oral Daily  . guaiFENesin  600 mg Oral BID  . levalbuterol  0.63 mg Nebulization Q6H  . morphine   Intravenous Q4H  . senna-docusate  1 tablet Oral BID  . sodium chloride flush  3 mL Intravenous Q12H  . sodium chloride flush  3 mL Intravenous Q12H   Continuous Infusions: . sodium  chloride 100 mL/hr at 08/07/16 0617  . sodium chloride    . azithromycin Stopped (08/06/16 1303)  . cefTRIAXone (ROCEPHIN)  IV Stopped (08/06/16 1108)  . cefUROXime (ZINACEF)  IV Stopped (08/07/16 0344)  . lactated ringers Stopped (08/07/16 0100)  . potassium chloride     PRN Meds:.sodium chloride, acetaminophen **OR** acetaminophen, albuterol, diphenhydrAMINE **OR** diphenhydrAMINE, HYDROcodone-acetaminophen, naloxone **AND** sodium chloride flush, ondansetron **OR** ondansetron (ZOFRAN) IV, oxyCODONE, polyethylene glycol, potassium chloride, sodium chloride flush, traMADol  General appearance: alert and cooperative Neurologic: intact Heart: regular rate and rhythm, S1, S2 normal, no murmur, click, rub or gallop Lungs: diminished breath sounds LLL Abdomen: soft, non-tender; bowel sounds normal; no masses,  no organomegaly Extremities: extremities normal, atraumatic, no cyanosis or edema and Homans sign is negative, no sign of DVT Wound: no air leak from chest tubes   Lab Results: CBC: Recent Labs  08/06/16 0323 08/07/16 0340  WBC 12.6* 12.2*  HGB 12.9* 11.0*  HCT 39.2 33.8*  PLT 270 232   BMET:  Recent Labs  08/06/16 0323 08/07/16 0530  NA 133* 133*  K 4.3 4.2  CL 101 98*  CO2 25 27  GLUCOSE 115* 104*  BUN 10 8  CREATININE 0.67 0.66  CALCIUM 8.3* 8.2*    CMET: Lab Results  Component Value Date   WBC 12.2 (H) 08/07/2016   HGB 11.0 (L) 08/07/2016   HCT 33.8 (L) 08/07/2016   PLT 232 08/07/2016   GLUCOSE 104 (H) 08/07/2016   ALT 10 (L) 08/04/2016   AST 12 (L) 08/04/2016   NA 133 (L) 08/07/2016   K 4.2 08/07/2016   CL 98 (L) 08/07/2016   CREATININE 0.66 08/07/2016   BUN 8 08/07/2016   CO2 27 08/07/2016   TSH 4.746 (H) 08/01/2016   INR 1.24 08/04/2016      PT/INR: No results for input(s): LABPROT, INR in the last 72 hours. Radiology: Dg Chest Port 1 View  Result Date: 08/07/2016 CLINICAL DATA:  Pleural effusion.  Chest tube. EXAM: PORTABLE CHEST 1 VIEW  COMPARISON:  08/06/2016. FINDINGS: Right IJ line and left chest tubes in stable position. Interim partial resolution of left-sided pneumothorax with mild residual. Heart size normal. Left lung infiltrate with left base atelectasis again noted. Left pleural effusion again noted. IMPRESSION: 1. Left chest tubes in stable position . Interim partial resolution of left sided pneumothorax with mild residual . 2. Left lung infiltrate with left base atelectasis again noted. Left-sided pleural effusion again noted. Electronically Signed   By: Jeremy Chan  Register   On: 08/07/2016 07:23   Dg Chest Port 1 View  Result Date: 08/06/2016 CLINICAL DATA:  Status post VATS EXAM: PORTABLE CHEST 1 VIEW COMPARISON:  08/06/2016, earlier the same day FINDINGS: Interval placement of 2 left-sided chest tubes with decrease in left pleural effusion. Left base collapse/ consolidation persists. Small left pneumothorax is visible, new in the interval. Right IJ central line tip projects over the proximal SVC, near the innominate vein confluence. Prominence of the minor fissure noted. The cardio pericardial silhouette is enlarged. Telemetry leads overlie the chest. IMPRESSION: 1. Status post placement of 2 left-sided chest tubes with small left pneumothorax now visible. 2. Interval decrease in left pleural effusion with persistent left base collapse/consolidation. 3. Right IJ central line tip overlies the proximal SVC region near the innominate vein confluence. Electronically Signed   By: Jeremy Chan M.D.   On: 08/06/2016 18:04     Assessment/Plan: S/P Procedure(s) (LRB): VIDEO ASSISTED THORACOSCOPY, left (Left) TALC PLEURADESIS (Left) PLEURAL BIOPSY (Left) VIDEO BRONCHOSCOPY (N/A) INSERTION PLEURAL DRAINAGE CATHETER, left (Left) Mobilize D/c foley and a line Chest tube to water seal likely remove in am and start pleurix drainage  Transfer to step down per primary service ok from surgical view Path pending will need oncology  consult  Expected Acute  Blood - loss Anemia  Jeremy Chan 08/07/2016 7:57 AM

## 2016-08-07 NOTE — Care Management Note (Signed)
Case Management Note Marvetta Gibbons RN, BSN Unit 2W-Case Manager [phone]  Patient Details  Name: GURNOOR URSUA MRN: [id number] Date of Birth: 09-Jun-1940  Subjective/Objective:  Pt admitted with pl. Effusions s/p thoracentesis x2- then s/p  VATS, talc pleurodesis and pleural biopsy per [name] on 08/06/2016                  Action/Plan: PTA pt lived at home- CM to follow for d/c needs- noted pt had pleurX cath placed during VATS procedure   Expected Discharge Date:                  Expected Discharge Plan:  [place]  In-House Referral:     Discharge planning Services  CM Consult  Post Acute Care Choice:    Choice offered to:     DME Arranged:    DME Agency:     HH Arranged:    [place] Agency:     Status of Service:  In process, will continue to follow  If discussed at Long Length of Stay Meetings, dates discussed:    Discharge Disposition:  Additional Comments:  Dawayne Patricia, RN 08/07/2016, 12:11 PM

## 2016-08-07 NOTE — Progress Notes (Signed)
Spoke with Dr. Benay Spice of oncology, patient will be seen in consultation by Dr. Lorna Few of oncology on Friday, 08/10/2016.  No charge.

## 2016-08-07 NOTE — Progress Notes (Signed)
PROGRESS NOTE    TKAI SERFASS  MWU:[id number] DOB: [birth year] DOA: 07/31/2016 PCP: Haze Rushing, MD    Brief Narrative:  Patient is 76y old male with history of obstructive sleep apnea, prostate cancer, hypertension, hyperlipidemia, coronary artery disease presented to the ED with progressive worsening shortness of breath and occasional chest pain for the past few weeks. Patient noted to have a large left-sided pleural effusion. Pulmonary/critical care consulted and patient underwent a thoracentesis. Cytology from first thoracentesis with atypical cells. Repeat thoracentesis cytology with atypical cells concerning for carcinoma. TCTS consulted and patient s/p VATS, talc pleurodesis and pleural biopsy per [name] on 08/06/2016. Pathology pending.   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 ?? Parapneumonic effusion vs malignant effusion. Patient status post diagnostic and therapeutic paracentesis per pulmonary with 2.1 L of serosanguineous dark orange blood-tinged pleural fluid obtained. Fluid cultures pending. By light's criteria likely an exudate.  Cytology with atypical cells present. Patient was seen by pulmonary and patient underwent another thoracentesis with 600 mL of bloody pleural fluid obtained and sent for cytology/pathology (08/02/2016) which showed rare atypical cells concerning for carcinoma. Repeat chest x-ray postthoracentesis negative for pneumothorax. CT chest ordered per pulmonary for further evaluation with bilateral lung nodules, persistent moderate left pleural effusion with left lower lobe consolidation, no pneumothorax. Due to  consolidation noted on CT chest and leukocytosis, patient started empirically on IV antibiotics. Patient being followed by pulmonary. Cardiothoracic surgery was consulted on the patient. 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 Patient s/p VATs, talc pleurodesis, and pleural biopsy with [name] 08/06/2016 with pathology pending. Patient with chest tube being managed by cardiothoracic surgery. Consult with oncology for further evaluation and management.  Pulmonary and TCTS following and appreciate input and recommendations.  #2  probable community acquired pneumonia Per CT chest 08/02/2016. Patient with a large left pleural effusion and left lower lobe consolidation noted on CT chest. Patient also with a leukocytosis. Sputum Gram stain and cultures negative. Urine strep pneumococcus antigen negative. Urine Legionella antigen negative. Continue empiric IV Rocephin and IV azithromycin.  Pulmonary following.   #3 left pneumothorax Noted on chest x-ray postoperatively. Repeat chest x-ray this morning with partial resolution of pneumothorax. Patient with left chest tube. Per CT surgery.  #4 hypertension Stable. Continue current home regimen of Norvasc, benazepril.  #5 hyperlipidemia Continue statin.  #6 chest pain Likely secondary to problem #1. Cardiac enzymes negative 3. Clinical improvement slowly. Follow.   #7. Acute blood loss anemia Follow H&H.   DVT prophylaxis: SCDs Code Status: Full Family Communication: Updated patient. No family at bedside. Disposition Plan: Transfer to stepdown unit. Home per cardiothoracic surgery.   Consultants:   PCCM/pulmonary 07/31/2016 Dr. Jimmey Ralph  TCTS: Dr Servando Snare 08/03/2016  Oncology pending  Procedures:   Diagnostic and therapeutic thoracentesis per Dr. Jimmey Ralph 08/01/2016  Chest x-ray 08/01/2016, 07/31/2016, 08/02/2016, 08/06/2016, 08/07/2016 Diagnostic  and therapeutic thoracentesis per Dr. Elsworth Soho 08/02/2016 Ct chest  08/02/2016  VIDEO ASSISTED THORACOSCOPY, left (Left) TALC PLEURADESIS (Left) PLEURAL BIOPSY (Left) VIDEO BRONCHOSCOPY (N/A) INSERTION PLEURAL DRAINAGE CATHETER, left----[name] 08/06/2016  Antimicrobials:  IV azithromycin 08/03/2016>>>>> 08/10/2016  IV Rocephin 08/03/2016>>>>>> 08/10/2016   Subjective: Patient sitting up in chair. Patient states left-sided chest pain improving and managed on current PCA pump. Patient with no significant shortness of breath.  Objective: Vitals:   08/07/16 0316 08/07/16 0600 08/07/16 0812 08/07/16 0817  BP: (!) 119/58  109/67   Pulse: 81  75   Resp: 13  14   Temp: 98.2 F (36.8 C)  98.1 F (36.7 C)   TempSrc: Oral  Oral   SpO2: 98%   98%  Weight:  96.9 kg (213 lb 10 oz)    Height:        Intake/Output Summary (Last 24 hours) at 08/07/16 0943 Last data filed at 08/07/16 0700  Gross per 24 hour  Intake          2640.66 ml  Output             2110 ml  Net           530.66 ml   Filed Weights   08/05/16 0420 08/06/16 0426 08/07/16 0600  Weight: 97.1 kg (214 lb 1.6 oz) 96.4 kg (212 lb 9.6 oz) 96.9 kg (213 lb 10 oz)    Examination:  General exam: Appears calm and comfortable  Respiratory system: Decreased breath sounds in the left base. No wheezing.  Respiratory effort normal. Left chest tube Cardiovascular system: S1 & S2 heard, RRR. No JVD, murmurs, rubs, gallops or clicks. No pedal edema. Gastrointestinal system: Abdomen is nondistended, soft and nontender. No organomegaly or masses felt. Normal bowel sounds heard. Central nervous system: Alert and oriented. No focal neurological deficits. Extremities: Symmetric 5 x 5 power. Skin: No rashes, lesions or ulcers Psychiatry: Judgement and insight appear normal. Mood & affect appropriate.     Data Reviewed: I have personally reviewed following labs and imaging studies  CBC:  Recent Labs Lab 07/31/16 1815   08/02/16 0315 08/03/16 0205 08/04/16 5885 08/04/16 0446 08/06/16 0323 08/07/16 0340  WBC 12.1*  < > 14.1* 14.9* 14.7* 14.6* 12.6* 12.2*  NEUTROABS 8.3*  --  10.3* 10.8*  --   --   --  9.2*  HGB 14.6  < > 14.0 14.1 13.3 13.6 12.9* 11.0*  HCT 43.9  < > 43.3 42.5 39.6 40.9 39.2 33.8*  MCV 95.0  < > 94.3 93.4 93.0 93.4 92.2 92.6  PLT 271  < > 229 224 228 220 270 232  < > = values in this interval not displayed. Basic Metabolic Panel:  Recent Labs Lab 08/01/16 0604  08/03/16 0205 08/04/16 0204 08/04/16 0446 08/06/16 0323 08/07/16 0530  NA 137  < > 130* 133* 133* 133* 133*  K 4.0  < > 3.7 3.9 4.3 4.3 4.2  CL 104  < > 98* 99* 98* 101 98*  CO2 27  < > 26 27 27 25 27   GLUCOSE 101*  < > 138* 115* 108* 115* 104*  BUN 16  < > 11 11 11 10 8   CREATININE 0.68  < > 0.71 0.80 0.80 0.67 0.66  CALCIUM 8.7*  < > 8.3* 8.5* 8.5* 8.3* 8.2*  MG 2.0  --   --   --   --   --   --   PHOS 3.1  --   --   --   --   --   --   < > = values in this interval not displayed. GFR: Estimated Creatinine Clearance: 88.8 mL/min (by C-G formula based on SCr of  0.66 mg/dL). Liver Function Tests:  Recent Labs Lab 07/31/16 1815 08/01/16 0006 08/01/16 0604 08/02/16 0315 08/04/16 0446  AST 18  --  14* 15 12*  ALT 12*  --  10* 11* 10*  ALKPHOS 83  --  77 73 57  BILITOT 0.8  --  0.9 1.1 0.7  PROT 6.6  --  5.7* 5.3* 5.6*  ALBUMIN 3.8 3.6 3.1* 2.8* 2.5*   No results for input(s): LIPASE, AMYLASE in the last 168 hours. No results for input(s): AMMONIA in the last 168 hours. Coagulation Profile:  Recent Labs Lab 07/31/16 2122 08/04/16 0446  INR 1.00 1.24   Cardiac Enzymes:  Recent Labs Lab 08/01/16 0006 08/01/16 0604 08/01/16 1054  TROPONINI <0.03 <0.03 <0.03   BNP (last 3 results) No results for input(s): PROBNP in the last 8760 hours. HbA1C: No results for input(s): HGBA1C in the last 72 hours. CBG:  Recent Labs Lab 08/01/16 0629  GLUCAP 102*   Lipid Profile: No results for input(s):  CHOL, HDL, LDLCALC, TRIG, CHOLHDL, LDLDIRECT in the last 72 hours. Thyroid Function Tests: No results for input(s): TSH, T4TOTAL, FREET4, T3FREE, THYROIDAB in the last 72 hours. Anemia Panel: No results for input(s): VITAMINB12, FOLATE, FERRITIN, TIBC, IRON, RETICCTPCT in the last 72 hours. Sepsis Labs: No results for input(s): PROCALCITON, LATICACIDVEN in the last 168 hours.  Recent Results (from the past 240 hour(s))  Fungus Culture With Stain     Status: None (Preliminary result)   Collection Time: 07/31/16 11:59 PM  Result Value Ref Range Status   Fungus Stain Final report  Final    Comment: (NOTE) Performed At: [facility] [place], [place] [id number] Lindon Romp MD BO:[id number]    Fungus (Mycology) Culture PENDING  Incomplete   Fungal Source PLEURAL  Final  Fungus Culture Result     Status: None   Collection Time: 07/31/16 11:59 PM  Result Value Ref Range Status   Result 1 Comment  Final    Comment: (NOTE) KOH/Calcofluor preparation:  no fungus observed. Performed At: [facility] [place], [place] [id number] Lindon Romp MD IR:[id number]   Body fluid culture (includes gram stain)     Status: None   Collection Time: 08/01/16 12:13 AM  Result Value Ref Range Status   Specimen Description PLEURAL FLUID  Final   Special Requests Normal  Final   Gram Stain   Final    RARE WBC PRESENT,BOTH PMN AND MONONUCLEAR NO ORGANISMS SEEN    Culture NO GROWTH 3 DAYS  Final   Report Status 08/04/2016 FINAL  Final  Culture, sputum-assessment     Status: None   Collection Time: 08/03/16 10:40 AM  Result Value Ref Range Status   Specimen Description EXPECTORATED SPUTUM  Final   Special Requests NONE  Final   Sputum evaluation THIS SPECIMEN IS ACCEPTABLE FOR SPUTUM CULTURE  Final   Report Status 08/04/2016 FINAL  Final  Culture, respiratory (NON-Expectorated)     Status: None   Collection Time: 08/03/16 10:40 AM  Result Value  Ref Range Status   Specimen Description EXPECTORATED SPUTUM  Final   Special Requests NONE Reflexed from [id number]  Final   Gram Stain   Final    ABUNDANT WBC PRESENT,BOTH PMN AND MONONUCLEAR FEW GRAM POSITIVE COCCI IN PAIRS FEW GRAM NEGATIVE RODS RARE GRAM POSITIVE RODS    Culture Consistent with normal respiratory flora.  Final   Report Status 08/06/2016 FINAL  Final  Culture, blood (routine x  2) Call MD if unable to obtain prior to antibiotics being given     Status: None (Preliminary result)   Collection Time: 08/03/16 11:41 AM  Result Value Ref Range Status   Specimen Description BLOOD LEFT HAND  Final   Special Requests IN PEDIATRIC BOTTLE Blood Culture adequate volume  Final   Culture NO GROWTH 3 DAYS  Final   Report Status PENDING  Incomplete  Culture, blood (routine x 2) Call MD if unable to obtain prior to antibiotics being given     Status: None (Preliminary result)   Collection Time: 08/03/16 11:41 AM  Result Value Ref Range Status   Specimen Description BLOOD RIGHT ANTECUBITAL  Final   Special Requests IN PEDIATRIC BOTTLE Blood Culture adequate volume  Final   Culture NO GROWTH 3 DAYS  Final   Report Status PENDING  Incomplete  Surgical pcr screen     Status: None   Collection Time: 08/05/16  9:27 PM  Result Value Ref Range Status   MRSA, PCR NEGATIVE NEGATIVE Final   Staphylococcus aureus NEGATIVE NEGATIVE Final    Comment:        The Xpert SA Assay (FDA approved for NASAL specimens in patients over 62 years of age), is one component of a comprehensive surveillance program.  Test performance has been validated by [facility] for patients greater than or equal to 58 year old. It is not intended to diagnose infection nor to guide or monitor treatment.   Aerobic/Anaerobic Culture (surgical/deep wound)     Status: None (Preliminary result)   Collection Time: 08/06/16  4:33 PM  Result Value Ref Range Status   Specimen Description PLEURAL LEFT  Final   Special  Requests PT ON ZINACEF  Final   Gram Stain NO WBC SEEN NO ORGANISMS SEEN   Final   Culture PENDING  Incomplete   Report Status PENDING  Incomplete         Radiology Studies: Dg Chest 2 View  Result Date: 08/06/2016 CLINICAL DATA:  76 year old male with pleural effusion. Preoperative exam. Subsequent encounter. EXAM: CHEST  2 VIEW COMPARISON:  08/02/2016 CT and chest x-ray. FINDINGS: Moderate size left-sided pleural effusion with left lower lobe and lingular consolidation. CT detected pulmonary nodules not well demonstrated by plain film exam. No pneumothorax. Heart size within normal limits.  Calcified aorta. Ankylosis portion of thoracic spine with prominent osteophyte. IMPRESSION: Moderate size left-sided pleural effusion with left lower lobe and lingular consolidation. CT detected pulmonary nodules not well demonstrated by plain film exam. Electronically Signed   By: Genia Del M.D.   On: 08/06/2016 07:37   Dg Chest Port 1 View  Result Date: 08/07/2016 CLINICAL DATA:  Pleural effusion.  Chest tube. EXAM: PORTABLE CHEST 1 VIEW COMPARISON:  08/06/2016. FINDINGS: Right IJ line and left chest tubes in stable position. Interim partial resolution of left-sided pneumothorax with mild residual. Heart size normal. Left lung infiltrate with left base atelectasis again noted. Left pleural effusion again noted. IMPRESSION: 1. Left chest tubes in stable position . Interim partial resolution of left sided pneumothorax with mild residual . 2. Left lung infiltrate with left base atelectasis again noted. Left-sided pleural effusion again noted. Electronically Signed   By: Marcello Moores  Register   On: 08/07/2016 07:23   Dg Chest Port 1 View  Result Date: 08/06/2016 CLINICAL DATA:  Status post VATS EXAM: PORTABLE CHEST 1 VIEW COMPARISON:  08/06/2016, earlier the same day FINDINGS: Interval placement of 2 left-sided chest tubes with decrease in left pleural  effusion. Left base collapse/ consolidation persists. Small  left pneumothorax is visible, new in the interval. Right IJ central line tip projects over the proximal SVC, near the innominate vein confluence. Prominence of the minor fissure noted. The cardio pericardial silhouette is enlarged. Telemetry leads overlie the chest. IMPRESSION: 1. Status post placement of 2 left-sided chest tubes with small left pneumothorax now visible. 2. Interval decrease in left pleural effusion with persistent left base collapse/consolidation. 3. Right IJ central line tip overlies the proximal SVC region near the innominate vein confluence. Electronically Signed   By: Misty Stanley M.D.   On: 08/06/2016 18:04        Scheduled Meds: . acetaminophen  1,000 mg Oral Q6H   Or  . acetaminophen (TYLENOL) oral liquid 160 mg/5 mL  1,000 mg Oral Q6H  . amLODipine  5 mg Oral Daily  . atorvastatin  40 mg Oral q1800  . benazepril  20 mg Oral Daily  . bisacodyl  10 mg Oral Daily  . enoxaparin (LOVENOX) injection  30 mg Subcutaneous Q24H  . guaiFENesin  600 mg Oral BID  . levalbuterol  0.63 mg Nebulization Q6H  . morphine   Intravenous Q4H  . senna-docusate  1 tablet Oral BID  . sodium chloride flush  3 mL Intravenous Q12H  . sodium chloride flush  3 mL Intravenous Q12H   Continuous Infusions: . sodium chloride 100 mL/hr at 08/07/16 0617  . sodium chloride    . azithromycin Stopped (08/06/16 1303)  . cefTRIAXone (ROCEPHIN)  IV Stopped (08/06/16 1108)  . cefUROXime (ZINACEF)  IV Stopped (08/07/16 0344)  . lactated ringers Stopped (08/07/16 0100)  . potassium chloride       LOS: 6 days    Time spent: 40 minutes    THOMPSON,DANIEL, MD [facility] Pager [phone]  If 7PM-7AM, please contact night-coverage [email or web address] Password [facility] 08/07/2016, 9:43 AM

## 2016-08-07 NOTE — Op Note (Signed)
NAME:  Jeremy Chan, Jeremy Chan                     ACCOUNT NO.:  MEDICAL RECORD NO.:  [id number]  LOCATION:                                 FACILITY:  PHYSICIAN:  Lanelle Bal, MD    DATE OF BIRTH:  11-26-1940  DATE OF PROCEDURE:  08/06/2016 DATE OF DISCHARGE:                              OPERATIVE REPORT   PREOPERATIVE DIAGNOSIS:  Recurrent left pleural effusion.  POSTOPERATIVE DIAGNOSIS:  Left malignant pleural effusion.  Final path pending.  PROCEDURE PERFORMED:  Bronchoscopy, left video-assisted thoracoscopy, drainage of pleural effusion, pleural biopsies, multiple, placement of Pleurx catheter.  SURGEON:  Lanelle Bal, MD.  FIRST ASSISTANT:  Ellwood Handler, PA.  BRIEF HISTORY:  The patient is a 76 year old patient, who presented with increasing shortness of breath and was found to have a large left pleural effusion.  During his preoperative hospital stay, thoracentesis was done twice with no definitive diagnosis made and apparently rapid reaccumulation of fluid.  Thoracic Surgery was consulted and appeared preoperatively that most likely this was related to a malignant effusion and it was recommended to the patient that we proceed with bronchoscopy, left video-assisted thoracoscopy, drainage of fluid, pleural biopsies, talc pleurodesis, and placement of PleurX catheter.  The patient agreed and signed informed consent.  DESCRIPTION OF PROCEDURE:  The patient underwent general endotracheal anesthesia with a single-lumen endotracheal tube without incident. Appropriate time-out was performed and we proceeded with video bronchoscope to the subsegmental level both in the right and left tracheobronchial tree without evidence of endobronchial lesions.  The scope was removed.  A double-lumen endotracheal tube was placed.  The patient was turned in lateral decubitus position with left side up. Left chest was prepped with Betadine and draped in usual sterile manner. A second time-out  was performed confirming the laterality by x-ray and preoperative marking.  We then proceeded deflating the left lung.  A small incision was made approximately at seventh intercostal space posteriorly.  We were able to enter the pleural space.  There were significant number of adhesions present.  A second incision was made slightly more anterior in between the 2 with the 30-degree video scope working between the 2 sites.  Loculations were broken up.  Fluid was drained, approximately 700-800 mL of serous sanguinous pleural fluid. Obvious pleural nodules were present with visualization from the scope. Multiple biopsies of pleural nodules were obtained.  Initial biopsy was sent for frozen section confirming malignancy.  The remaining of the tissue was sent for further stains and for Foundation One and PD-L1 testing.  4 g of powdered sterile talc was insufflated into the chest. A PleurX catheter was tunneled and under direct vision, placed into the pleural space.  A single 28 chest tube was placed through the anterior port.  Posterior port was closed after checking reinflation of the lung. Dressings were applied.  He was then awakened and extubated in the operating room and transferred to the recovery room for postoperative care.  Estimated blood loss up to 700 mL of pleural fluid.  Sponge and needle count was reported as correct at completion of the procedure.  The patient tolerated the procedure without obvious complication.  Lanelle Bal, MD     EG/MEDQ  D:  08/07/2016  T:  08/07/2016  Job:  [id number]

## 2016-08-08 ENCOUNTER — Inpatient Hospital Stay (HOSPITAL_COMMUNITY): Payer: Medicare Other

## 2016-08-08 LAB — COMPREHENSIVE METABOLIC PANEL
ALBUMIN: 2.1 g/dL — AB (ref 3.5–5.0)
ALT: 23 U/L (ref 17–63)
AST: 25 U/L (ref 15–41)
Alkaline Phosphatase: 103 U/L (ref 38–126)
Anion gap: 8 (ref 5–15)
BUN: 8 mg/dL (ref 6–20)
CHLORIDE: 99 mmol/L — AB (ref 101–111)
CO2: 27 mmol/L (ref 22–32)
Calcium: 8.2 mg/dL — ABNORMAL LOW (ref 8.9–10.3)
Creatinine, Ser: 0.62 mg/dL (ref 0.61–1.24)
GFR calc non Af Amer: >60 mL/min (ref >60)
GLUCOSE: 102 mg/dL — AB (ref 65–99)
POTASSIUM: 4.1 mmol/L (ref 3.5–5.1)
SODIUM: 134 mmol/L — AB (ref 135–145)
Total Bilirubin: 0.9 mg/dL (ref 0.3–1.2)
Total Protein: 5.5 g/dL — ABNORMAL LOW (ref 6.5–8.1)

## 2016-08-08 LAB — ACID FAST SMEAR (AFB): ACID FAST SMEAR - AFSCU2: NEGATIVE

## 2016-08-08 LAB — CULTURE, BLOOD (ROUTINE X 2)
CULTURE: NO GROWTH
CULTURE: NO GROWTH
SPECIAL REQUESTS: ADEQUATE
SPECIAL REQUESTS: ADEQUATE

## 2016-08-08 LAB — CBC
HEMATOCRIT: 38.6 % — AB (ref 39.0–52.0)
HEMOGLOBIN: 12.5 g/dL — AB (ref 13.0–17.0)
MCH: 30.3 pg (ref 26.0–34.0)
MCHC: 32.4 g/dL (ref 30.0–36.0)
MCV: 93.5 fL (ref 78.0–100.0)
Platelets: 279 10*3/uL (ref 150–400)
RBC: 4.13 MIL/uL — AB (ref 4.22–5.81)
RDW: 13.4 % (ref 11.5–15.5)
WBC: 15 10*3/uL — ABNORMAL HIGH (ref 4.0–10.5)

## 2016-08-08 LAB — ACID FAST SMEAR (AFB, MYCOBACTERIA)

## 2016-08-08 MED ORDER — BISACODYL 10 MG RE SUPP: 10.0000 mg | Freq: Every day | RECTAL | Status: DC | PRN

## 2016-08-08 MED ORDER — POLYETHYLENE GLYCOL 3350 17 G PO PACK
17.0000 g | PACK | Freq: Every day | ORAL | Status: DC
  Administered 2016-08-08 – 2016-08-10 (×3): 17 g via ORAL
  Filled 2016-08-08 (×3): qty 1

## 2016-08-08 NOTE — Progress Notes (Addendum)
      [address]       [place] [address]             [phone]       2 Days Post-Op Procedure(s) (LRB): VIDEO ASSISTED THORACOSCOPY, left (Left) TALC PLEURADESIS (Left) PLEURAL BIOPSY (Left) VIDEO BRONCHOSCOPY (N/A) INSERTION PLEURAL DRAINAGE CATHETER, left (Left)  Subjective: Patient states chest tube was just removed-"that was not fun". Not eating much and requesting diet change.  Objective: Vital signs in last 24 hours: Temp:  [97.7 F (36.5 C)-98.3 F (36.8 C)] 98.3 F (36.8 C) (07/04 0355) Pulse Rate:  [66-88] 83 (07/04 0355) Cardiac Rhythm: Normal sinus rhythm (07/04 0700) Resp:  [15-21] 19 (07/04 0800) BP: (90-137)/(46-65) 109/53 (07/04 0355) SpO2:  [97 %-98 %] 97 % (07/04 0800) Arterial Line BP: (87-103)/(47-64) 87/47 (07/03 1200) FiO2 (%):  [36 %] 36 % (07/04 0800)     Intake/Output from previous day: 07/03 0701 - 07/04 0700 In: 580 [I.V.:380; IV Piggyback:200] Out: 916 [Urine:910; Chest Tube:6]   Physical Exam:  Cardiovascular: RRR. Pulmonary: Clear to auscultation on the right and diminished left base. Abdomen: Soft, non tender, bowel sounds present. Wounds: Dressing is clean and dry.    Lab Results: CBC: Recent Labs  08/07/16 0340 08/08/16 0545  WBC 12.2* 15.0*  HGB 11.0* 12.5*  HCT 33.8* 38.6*  PLT 232 279   BMET:  Recent Labs  08/07/16 0530 08/08/16 0545  NA 133* 134*  K 4.2 4.1  CL 98* 99*  CO2 27 27  GLUCOSE 104* 102*  BUN 8 8  CREATININE 0.66 0.62  CALCIUM 8.2* 8.2*    PT/INR: No results for input(s): LABPROT, INR in the last 72 hours. ABG:  INR: Will add last result for INR, ABG once components are confirmed Will add last 4 CBG results once components are confirmed  Assessment/Plan:  1. CV - SR in the 80's. On Benazepril 20 mg daily and Norvasc 5 mg daily. 2.  Pulmonary - S/p left Pleur X catheter. On room air. CXR this am shows no pneumothorax. Chest tube removed earlier. Check CXR in am.  Pleural fluid RARE ATYPICAL CELLS SUSPICIOUS FOR CARCINOMA. Will start drainage of left Pleur X in am.Encourage incentive spirometer. 3. ABL anemia-H and H increased to 12.5 and 38.6 4. ID-on Rocephin for probable CAP 5. Will change diet from heart healthy since not eating much to regular diet, with salt limitation. May need nutritional supplements as well.  ZIMMERMAN,DONIELLE MPA-C 08/08/2016,10:31 AM   Path still pending from surgery biopsy  Will need PET and Mri of brain as out patient  I have seen and examined Jeremy Chan and agree with the above assessment  and plan.  [name] MD Beeper [phone] Office [phone] 08/08/2016 1:16 PM

## 2016-08-08 NOTE — Care Management Important Message (Signed)
Important Message  Patient Details  Name: Jeremy Chan MRN: [id number] Date of Birth: 1940-02-11   Medicare Important Message Given:  Yes    Nathen May 08/08/2016, 9:32 AM

## 2016-08-08 NOTE — Progress Notes (Signed)
Patient ID: Jeremy Chan, male   DOB: 11-Oct-1940, 76 y.o.   MRN: [id number]  PROGRESS NOTE    Jeremy Chan  HEN:[id number] DOB: February 05, 1941 DOA: 07/31/2016 PCP: Haze Rushing, MD   Brief Narrative:  68y old male with history of obstructive sleep apnea, prostate cancer, hypertension, hyperlipidemia, coronary artery disease presented to the ED with progressive worsening shortness of breath and occasional chest pain for the past few weeks. Patient noted to have a large left-sided pleural effusion. Pulmonary/critical care consulted and patient underwent a thoracentesis. Cytology from first thoracentesis showed atypical cells. Repeat thoracentesis cytology showed atypical cells concerning for carcinoma. TCTS consulted and patient had  VATS, talc pleurodesis and pleural biopsy by [name] on 08/06/2016. Pathology pending.   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 Cytology showed atypical cells - Patient was seen by pulmonary and patient underwent another thoracentesis with 600 mL of bloody pleural fluid obtained and sent for cytology/pathology (08/02/2016) which showed rare atypical cells concerning for carcinoma. Repeat chest x-ray postthoracentesis negative for pneumothorax.  - Due to consolidation noted on CT chest and leukocytosis, patient started empirically on IV antibiotics. Cultures negative. Discontinue antibiotics today -  s/p VATs, talc pleurodesis, andpleural biopsy by [name] 08/06/2016 with  pathology pending. Patient with chest tube being managed by cardiothoracic surgery.  - Oncology consultation with Dr. Julien Nordmann most likely on Friday if patient is still inpatient otherwise outpatient follow-up with oncology   probable community acquired pneumonia - Status post Rocephin and Zithromax. Cultures negative. Discontinue antibiotics  left pneumothorax Noted on chest x-ray postoperatively. Chest tube being managed by CT surgery  hypertension Stable. Continue current home regimen of Norvasc, benazepril.  hyperlipidemia Continue statin.  Acute blood loss anemia History of unstable    DVT prophylaxis: Lovenox Code Status:  Full Family Communication: Discussed with daughter present at bedside Disposition Plan: Home in 2-3 days once cleared by CT surgery  Consultants: CT surgery, PCCM Oncology consultation pending Procedures:  - Diagnostic and therapeutic thoracentesis per Dr. Jimmey Ralph 08/01/2016  - Diagnostic and therapeutic thoracentesis per Dr. Elsworth Soho 08/02/2016  - Left-sided VATS with talc pleurodesis, pleural biopsy, Pleurx catheter placement on 08/06/16  Antimicrobials:  Rocephin and Zithromax from 08/02/2016  Subjective: Patient seen and examined at bedside. He denies any overnight fever, nausea, vomiting.  Objective: Vitals:   08/08/16 1000 08/08/16 1100 08/08/16 1128 08/08/16 1200  BP:   126/71   Pulse: 80 78 86 84  Resp: 13 13 20 18   Temp:   97.9 F (36.6 C)   TempSrc:   Oral   SpO2: 98% 97% 98% 97%  Weight:      Height:        Intake/Output Summary (Last 24 hours) at 08/08/16 1528 Last data filed at 08/08/16 1200  Gross per 24 hour  Intake              330 ml  Output              990 ml  Net             -  660 ml   Filed Weights   08/05/16 0420 08/06/16 0426 08/07/16 0600  Weight: 97.1 kg (214 lb 1.6 oz) 96.4 kg (212 lb 9.6 oz) 96.9 kg (213 lb 10 oz)    Examination:  General exam: Appears calm and comfortable  Respiratory system:  Bilateral decreased breath sound at bases With some scattered crackles. Left-sided chest tube present Cardiovascular system: S1 & S2 heard, RRR. No JVD, murmurs, rubs, gallops. Gastrointestinal system: Abdomen is nondistended, soft and nontender. Normal bowel sounds heard. Central nervous system: Alert and oriented. No focal neurological deficits. Moving extremities Extremities: No cyanosis, clubbing, edema     Data Reviewed: I have personally reviewed following labs and imaging studies  CBC:  Recent Labs Lab 08/02/16 0315 08/03/16 0205 08/04/16 0204 08/04/16 0446 08/06/16 0323 08/07/16 0340 08/08/16 0545  WBC 14.1* 14.9* 14.7* 14.6* 12.6* 12.2* 15.0*  NEUTROABS 10.3* 10.8*  --   --   --  9.2*  --   HGB 14.0 14.1 13.3 13.6 12.9* 11.0* 12.5*  HCT 43.3 42.5 39.6 40.9 39.2 33.8* 38.6*  MCV 94.3 93.4 93.0 93.4 92.2 92.6 93.5  PLT 229 224 228 220 270 232 297   Basic Metabolic Panel:  Recent Labs Lab 08/04/16 0204 08/04/16 0446 08/06/16 0323 08/07/16 0530 08/08/16 0545  NA 133* 133* 133* 133* 134*  K 3.9 4.3 4.3 4.2 4.1  CL 99* 98* 101 98* 99*  CO2 27 27 25 27 27   GLUCOSE 115* 108* 115* 104* 102*  BUN 11 11 10 8 8   CREATININE 0.80 0.80 0.67 0.66 0.62  CALCIUM 8.5* 8.5* 8.3* 8.2* 8.2*   GFR: Estimated Creatinine Clearance: 88.8 mL/min (by C-G formula based on SCr of 0.62 mg/dL). Liver Function Tests:  Recent Labs Lab 08/02/16 0315 08/04/16 0446 08/08/16 0545  AST 15 12* 25  ALT 11* 10* 23  ALKPHOS 73 57 103  BILITOT 1.1 0.7 0.9  PROT 5.3* 5.6* 5.5*  ALBUMIN 2.8* 2.5* 2.1*   No results for input(s): LIPASE, AMYLASE in the last 168 hours. No results for input(s): AMMONIA in the last 168 hours. Coagulation Profile:  Recent Labs Lab 08/04/16 0446  INR 1.24   Cardiac Enzymes: No results for input(s): CKTOTAL, CKMB, CKMBINDEX, TROPONINI in the last 168 hours. BNP (last 3 results) No results for input(s): PROBNP in the last 8760 hours. HbA1C: No results  for input(s): HGBA1C in the last 72 hours. CBG: No results for input(s): GLUCAP in the last 168 hours. Lipid Profile: No results for input(s): CHOL, HDL, LDLCALC, TRIG, CHOLHDL, LDLDIRECT in the last 72 hours. Thyroid Function Tests: No results for input(s): TSH, T4TOTAL, FREET4, T3FREE, THYROIDAB in the last 72 hours. Anemia Panel: No results for input(s): VITAMINB12, FOLATE, FERRITIN, TIBC, IRON, RETICCTPCT in the last 72 hours. Sepsis Labs: No results for input(s): PROCALCITON, LATICACIDVEN in the last 168 hours.  Recent Results (from the past 240 hour(s))  Fungus Culture With Stain     Status: None (Preliminary result)   Collection Time: 07/31/16 11:59 PM  Result Value Ref Range Status   Fungus Stain Final report  Final    Comment: (NOTE) Performed At: [facility] [place], [place] [id number] Lindon Romp MD [id number]    Fungus (Mycology) Culture PENDING  Incomplete   Fungal Source PLEURAL  Final  Fungus Culture Result     Status: None   Collection Time: 07/31/16 11:59 PM  Result Value Ref Range Status   Result 1 Comment  Final  Comment: (NOTE) KOH/Calcofluor preparation:  no fungus observed. Performed At: [facility] [place], [place] [id number] Lindon Romp MD [id number]   Body fluid culture (includes gram stain)     Status: None   Collection Time: 08/01/16 12:13 AM  Result Value Ref Range Status   Specimen Description PLEURAL FLUID  Final   Special Requests Normal  Final   Gram Stain   Final    RARE WBC PRESENT,BOTH PMN AND MONONUCLEAR NO ORGANISMS SEEN    Culture NO GROWTH 3 DAYS  Final   Report Status 08/04/2016 FINAL  Final  Culture, sputum-assessment     Status: None   Collection Time: 08/03/16 10:40 AM  Result Value Ref Range Status   Specimen Description EXPECTORATED SPUTUM  Final   Special Requests NONE  Final   Sputum evaluation THIS SPECIMEN IS ACCEPTABLE FOR SPUTUM CULTURE  Final    Report Status 08/04/2016 FINAL  Final  Culture, respiratory (NON-Expectorated)     Status: None   Collection Time: 08/03/16 10:40 AM  Result Value Ref Range Status   Specimen Description EXPECTORATED SPUTUM  Final   Special Requests NONE Reflexed from [id number]  Final   Gram Stain   Final    ABUNDANT WBC PRESENT,BOTH PMN AND MONONUCLEAR FEW GRAM POSITIVE COCCI IN PAIRS FEW GRAM NEGATIVE RODS RARE GRAM POSITIVE RODS    Culture Consistent with normal respiratory flora.  Final   Report Status 08/06/2016 FINAL  Final  Culture, blood (routine x 2) Call MD if unable to obtain prior to antibiotics being given     Status: None   Collection Time: 08/03/16 11:41 AM  Result Value Ref Range Status   Specimen Description BLOOD LEFT HAND  Final   Special Requests IN PEDIATRIC BOTTLE Blood Culture adequate volume  Final   Culture NO GROWTH 5 DAYS  Final   Report Status 08/08/2016 FINAL  Final  Culture, blood (routine x 2) Call MD if unable to obtain prior to antibiotics being given     Status: None   Collection Time: 08/03/16 11:41 AM  Result Value Ref Range Status   Specimen Description BLOOD RIGHT ANTECUBITAL  Final   Special Requests IN PEDIATRIC BOTTLE Blood Culture adequate volume  Final   Culture NO GROWTH 5 DAYS  Final   Report Status 08/08/2016 FINAL  Final  Surgical pcr screen     Status: None   Collection Time: 08/05/16  9:27 PM  Result Value Ref Range Status   MRSA, PCR NEGATIVE NEGATIVE Final   Staphylococcus aureus NEGATIVE NEGATIVE Final    Comment:        The Xpert SA Assay (FDA approved for NASAL specimens in patients over 48 years of age), is one component of a comprehensive surveillance program.  Test performance has been validated by [facility] for patients greater than or equal to 39 year old. It is not intended to diagnose infection nor to guide or monitor treatment.   Aerobic/Anaerobic Culture (surgical/deep wound)     Status: None (Preliminary result)   Collection  Time: 08/06/16  4:33 PM  Result Value Ref Range Status   Specimen Description PLEURAL LEFT  Final   Special Requests PT ON ZINACEF  Final   Gram Stain NO WBC SEEN NO ORGANISMS SEEN   Final   Culture NO GROWTH 2 DAYS  Final   Report Status PENDING  Incomplete  Acid Fast Smear (AFB)     Status: None   Collection Time: 08/06/16  4:33  PM  Result Value Ref Range Status   AFB Specimen Processing Concentration  Final   Acid Fast Smear Negative  Final    Comment: (NOTE) Performed At: [facility] [place], [place] [id number] Lindon Romp MD WN:[id number]    Source (AFB) PLEURAL  Final    Comment: LEFT         Radiology Studies: Dg Chest Port 1 View  Result Date: 08/08/2016 CLINICAL DATA:  Chest tube EXAM: PORTABLE CHEST 1 VIEW COMPARISON:  08/07/2016 FINDINGS: Left chest tube is in place. The side port remains in the chest wall. No visible pneumothorax. Moderate left pleural effusion. Probable PleurX drainage catheter projects over the left lower hemithorax. Right lung is clear. Cardiomegaly. Diffuse left lung airspace disease is unchanged. IMPRESSION: Left PleurX catheter and second left chest tube remain in place, unchanged. The left chest tube side port is in the chest wall soft tissues. No visible pneumothorax. Continued diffuse left lung airspace disease and moderate left effusion. Electronically Signed   By: Rolm Baptise M.D.   On: 08/08/2016 07:56   Dg Chest Port 1 View  Result Date: 08/07/2016 CLINICAL DATA:  Pleural effusion.  Chest tube. EXAM: PORTABLE CHEST 1 VIEW COMPARISON:  08/06/2016. FINDINGS: Right IJ line and left chest tubes in stable position. Interim partial resolution of left-sided pneumothorax with mild residual. Heart size normal. Left lung infiltrate with left base atelectasis again noted. Left pleural effusion again noted. IMPRESSION: 1. Left chest tubes in stable position . Interim partial resolution of left sided pneumothorax with mild  residual . 2. Left lung infiltrate with left base atelectasis again noted. Left-sided pleural effusion again noted. Electronically Signed   By: Marcello Moores  Register   On: 08/07/2016 07:23   Dg Chest Port 1 View  Result Date: 08/06/2016 CLINICAL DATA:  Status post VATS EXAM: PORTABLE CHEST 1 VIEW COMPARISON:  08/06/2016, earlier the same day FINDINGS: Interval placement of 2 left-sided chest tubes with decrease in left pleural effusion. Left base collapse/ consolidation persists. Small left pneumothorax is visible, new in the interval. Right IJ central line tip projects over the proximal SVC, near the innominate vein confluence. Prominence of the minor fissure noted. The cardio pericardial silhouette is enlarged. Telemetry leads overlie the chest. IMPRESSION: 1. Status post placement of 2 left-sided chest tubes with small left pneumothorax now visible. 2. Interval decrease in left pleural effusion with persistent left base collapse/consolidation. 3. Right IJ central line tip overlies the proximal SVC region near the innominate vein confluence. Electronically Signed   By: Misty Stanley M.D.   On: 08/06/2016 18:04        Scheduled Meds: . acetaminophen  1,000 mg Oral Q6H   Or  . acetaminophen (TYLENOL) oral liquid 160 mg/5 mL  1,000 mg Oral Q6H  . amLODipine  5 mg Oral Daily  . atorvastatin  40 mg Oral q1800  . benazepril  20 mg Oral Daily  . bisacodyl  10 mg Oral Daily  . enoxaparin (LOVENOX) injection  30 mg Subcutaneous Q24H  . guaiFENesin  600 mg Oral BID  . senna-docusate  1 tablet Oral BID  . sodium chloride flush  3 mL Intravenous Q12H  . sodium chloride flush  3 mL Intravenous Q12H   Continuous Infusions: . sodium chloride 10 mL/hr at 08/07/16 1000  . sodium chloride    . azithromycin Stopped (08/08/16 1251)  . cefTRIAXone (ROCEPHIN)  IV Stopped (08/08/16 1221)  . lactated ringers Stopped (08/07/16 0100)  . potassium  chloride       LOS: 7 days        Aline August, MD [facility] Pager [phone]  If 7PM-7AM, please contact night-coverage [email or web address] Password [email or web address] 08/08/2016, 3:28 PM

## 2016-08-09 ENCOUNTER — Inpatient Hospital Stay (HOSPITAL_COMMUNITY): Payer: Medicare Other

## 2016-08-09 LAB — BASIC METABOLIC PANEL
Anion gap: 8 (ref 5–15)
BUN: 9 mg/dL (ref 6–20)
CHLORIDE: 98 mmol/L — AB (ref 101–111)
CO2: 28 mmol/L (ref 22–32)
CREATININE: 0.66 mg/dL (ref 0.61–1.24)
Calcium: 8.3 mg/dL — ABNORMAL LOW (ref 8.9–10.3)
GFR calc non Af Amer: >60 mL/min (ref >60)
Glucose, Bld: 98 mg/dL (ref 65–99)
POTASSIUM: 4.3 mmol/L (ref 3.5–5.1)
Sodium: 134 mmol/L — ABNORMAL LOW (ref 135–145)

## 2016-08-09 LAB — CBC WITH DIFFERENTIAL/PLATELET
BASOS PCT: 0 %
Basophils Absolute: 0 10*3/uL (ref 0.0–0.1)
Eosinophils Absolute: 0.3 10*3/uL (ref 0.0–0.7)
Eosinophils Relative: 2 %
HEMATOCRIT: 37.5 % — AB (ref 39.0–52.0)
HEMOGLOBIN: 12.1 g/dL — AB (ref 13.0–17.0)
LYMPHS ABS: 1.1 10*3/uL (ref 0.7–4.0)
Lymphocytes Relative: 8 %
MCH: 30 pg (ref 26.0–34.0)
MCHC: 32.3 g/dL (ref 30.0–36.0)
MCV: 92.8 fL (ref 78.0–100.0)
MONOS PCT: 15 %
Monocytes Absolute: 2.1 10*3/uL — ABNORMAL HIGH (ref 0.1–1.0)
NEUTROS ABS: 11.1 10*3/uL — AB (ref 1.7–7.7)
NEUTROS PCT: 75 %
Platelets: 298 10*3/uL (ref 150–400)
RBC: 4.04 MIL/uL — AB (ref 4.22–5.81)
RDW: 13.4 % (ref 11.5–15.5)
WBC: 14.6 10*3/uL — ABNORMAL HIGH (ref 4.0–10.5)

## 2016-08-09 LAB — MAGNESIUM: Magnesium: 2 mg/dL (ref 1.7–2.4)

## 2016-08-09 NOTE — Discharge Instructions (Signed)
Daily Pleurix drainage started 08/09/2016: If <150 ml /drainage session x3 consecutive occasions - QOD drainage If <150 ml /drainage session x3 consecutive occasions on  QOD drainage schedule- call for office evaluation and possible removal   Thoracoscopy, Care After Refer to this sheet in the next few weeks. These instructions provide you with information about caring for yourself after your procedure. Your health care provider may also give you more specific instructions. Your treatment has been planned according to current medical practices, but problems sometimes occur. Call your health care provider if you have any problems or questions after your procedure. What can I expect after the procedure? After your procedure, it is common to feel sore for up to two weeks. Follow these instructions at home:  There are many different ways to close and cover an incision, including stitches (sutures), skin glue, and adhesive strips. Follow your health care provider's instructions about: ? Incision care. ? Bandage (dressing) changes and removal. ? Incision closure removal.  Check your incision area every day for signs of infection. Watch for: ? Redness, swelling, or pain. ? Fluid, blood, or pus.  Take medicines only as directed by your health care provider.  Try to cough often. Coughing helps to protect against lung infection (pneumonia). It may hurt to cough. If this happens, hold a pillow against your chest when you cough.  Take deep breaths. This also helps to protect against pneumonia.  If you were given an incentive spirometer, use it as directed by your health care provider.  Do not take baths, swim, or use a hot tub until your health care provider approves. You may take showers.  Avoid lifting until your health care provider approves.  Avoid driving until your health care provider approves.  Do not travel by airplane after the chest tube is removed until your health care provider  approves. Contact a health care provider if:  You have a fever.  Pain medicines do not ease your pain.  You have redness, swelling, or increasing pain in your incision area.  You develop a cough that does not go away, or you are coughing up mucus that is yellow or green. Get help right away if:  You have fluid, blood, or pus coming from your incision.  There is a bad smell coming from your incision or dressing.  You develop a rash.  You have difficulty breathing.  You cough up blood.  You develop light-headedness or you feel faint.  You develop chest pain.  Your heartbeat feels irregular or very fast. This information is not intended to replace advice given to you by your health care provider. Make sure you discuss any questions you have with your health care provider. Document Released: 08/11/2004 Document Revised: 09/25/2015 Document Reviewed: 10/07/2013 Elsevier Interactive Patient Education  2018 [employer].

## 2016-08-09 NOTE — Progress Notes (Addendum)
Jeremy Chan       Jeremy Chan,Jeremy Chan [id number]             [phone]      3 Days Post-Op Procedure(s) (LRB): VIDEO ASSISTED THORACOSCOPY, left (Left) TALC PLEURADESIS (Left) PLEURAL BIOPSY (Left) VIDEO BRONCHOSCOPY (N/A) INSERTION PLEURAL DRAINAGE CATHETER, left (Left) Subjective: Feels ok, some constipation, some dyspnea on exertion  Objective: Vital signs in last 24 hours: Temp:  [97.6 F (36.4 C)-98.8 F (37.1 C)] 97.7 F (36.5 C) (07/05 1229) Pulse Rate:  [81-93] 81 (07/05 1229) Cardiac Rhythm: Normal sinus rhythm (07/05 0700) Resp:  [16-21] 18 (07/05 1229) BP: (114-129)/(60-67) 114/61 (07/05 1229) SpO2:  [92 %-95 %] 94 % (07/05 1229)  Hemodynamic parameters for last 24 hours:    Intake/Output from previous day: 07/04 0701 - 07/05 0700 In: 540 [P.O.:240; IV Piggyback:300] Out: 1400 [Urine:1400] Intake/Output this shift: Total I/O In: -  Out: 300 [Urine:300]  General appearance: alert, cooperative and no distress Heart: regular rate and rhythm Lungs: tubular BS on left lower half  Abdomen: benign Extremities: no edema or calf tenderness Wound: dressing CDI  Lab Results:  Recent Labs  08/08/16 0545 08/09/16 0530  WBC 15.0* 14.6*  HGB 12.5* 12.1*  HCT 38.6* 37.5*  PLT 279 298   BMET:  Recent Labs  08/08/16 0545 08/09/16 0530  NA 134* 134*  K 4.1 4.3  CL 99* 98*  CO2 27 28  GLUCOSE 102* 98  BUN 8 9  CREATININE 0.62 0.66  CALCIUM 8.2* 8.3*    PT/INR: No results for input(s): LABPROT, INR in the last 72 hours. ABG    Component Value Date/Time   PHART 7.455 (H) 08/07/2016 0335   HCO3 24.9 08/07/2016 0335   O2SAT 96.6 08/07/2016 0335   CBG (last 3)  No results for input(s): GLUCAP in the last 72 hours.  Meds Scheduled Meds: . acetaminophen  1,000 mg Oral Q6H   Or  . acetaminophen (TYLENOL) oral liquid 160 mg/5 mL  1,000 mg Oral Q6H  . amLODipine  5 mg Oral Daily  . atorvastatin  40 mg Oral q1800  . benazepril  20  mg Oral Daily  . bisacodyl  10 mg Oral Daily  . enoxaparin (LOVENOX) injection  30 mg Subcutaneous Q24H  . guaiFENesin  600 mg Oral BID  . polyethylene glycol  17 g Oral Daily  . senna-docusate  1 tablet Oral BID  . sodium chloride flush  3 mL Intravenous Q12H  . sodium chloride flush  3 mL Intravenous Q12H   Continuous Infusions: . sodium chloride 10 mL/hr at 08/07/16 1000  . sodium chloride    . lactated ringers Stopped (08/07/16 0100)  . potassium chloride     PRN Meds:.sodium chloride, acetaminophen **OR** acetaminophen, albuterol, bisacodyl, HYDROcodone-acetaminophen, levalbuterol, ondansetron **OR** ondansetron (ZOFRAN) IV, oxyCODONE, potassium chloride, sodium chloride flush, traMADol  Xrays Dg Chest 2 View  Result Date: 08/09/2016 CLINICAL DATA:  Follow-up left pneumothorax. History of bronchoscopy, pleural biopsy, and talc pleurodesis on August 06, 2016 EXAM: CHEST  2 VIEW COMPARISON:  Portable chest x-ray of August 08, 2016. FINDINGS: The right lung is well-expanded and clear. On the left there is persistent volume loss. No definite pneumothorax is observed today. There is pleural fluid and/or pleural thickening along the lateral pleural surface and at the left lung base. The small caliber chest tube is coiled just above the left lateral costophrenic gutter. The large chest tube is been removed. The cardiac silhouette  is partially obscured. The pulmonary vascularity is not clearly engorged. The right internal jugular venous catheter tip projects over the proximal SVC. IMPRESSION: Slight interval improvement in the aeration of both lungs. Considerable volume loss on the left is present with pleural thickening and/or pleural effusion laterally and at the lung base. No definite pneumothorax is observed though a tiny left apical pneumothorax cannot be absolutely excluded. The small caliber chest tube is coiled at over the lateral costophrenic angle region. The larger chest tube has been removed.  Electronically Signed   By: David  Martinique M.D.   On: 08/09/2016 07:33   Dg Chest Port 1 View  Result Date: 08/08/2016 CLINICAL DATA:  Chest tube EXAM: PORTABLE CHEST 1 VIEW COMPARISON:  08/07/2016 FINDINGS: Left chest tube is in place. The side port remains in the chest wall. No visible pneumothorax. Moderate left pleural effusion. Probable PleurX drainage catheter projects over the left lower hemithorax. Right lung is clear. Cardiomegaly. Diffuse left lung airspace disease is unchanged. IMPRESSION: Left PleurX catheter and second left chest tube remain in place, unchanged. The left chest tube side port is in the chest wall soft tissues. No visible pneumothorax. Continued diffuse left lung airspace disease and moderate left effusion. Electronically Signed   By: Rolm Baptise M.D.   On: 08/08/2016 07:56    Assessment/Plan: S/P Procedure(s) (LRB): VIDEO ASSISTED THORACOSCOPY, left (Left) TALC PLEURADESIS (Left) PLEURAL BIOPSY (Left) VIDEO BRONCHOSCOPY (N/A) INSERTION PLEURAL DRAINAGE CATHETER, left (Left)   1 stable overall 2 chest tubes out, pleurx in place 3 hemodyn stable in sinus rhythm, leukocytosis is improving- now off rocephin 4 Path is pending 5 primary management per IM service  LOS: 8 days    Jeremy Chan 08/09/2016  path still pending  I have seen and examined Jeremy Chan and agree with the above assessment  and plan.  [name] MD Beeper [phone] Office [phone] 08/09/2016 6:39 PM

## 2016-08-09 NOTE — Evaluation (Signed)
Physical Therapy Evaluation Patient Details Name: Jeremy Chan MRN: [id number] DOB: May 19, 1940 Today's Date: 08/09/2016   History of Present Illness  Pt is a 76 yo male admitted with progressive shortness of breath and occasional chest pain for the past few weeks, dx with pleural effusion s/p thorocentesis and pleural biopsy 08/07/16.  Prior medical history significant of OSA, prostate cancer, GERD, HTN, HLD, CAD  Clinical Impression  Pt admitted with above diagnosis. Pt currently with functional limitations due to the deficits listed below (see PT Problem List). Pt is min guard for transfers and ambulation of 200 feet with RW. Pt currently limited by fatigue and weakness. Pt will benefit from skilled PT to increase their independence and safety with mobility to allow discharge to the venue listed below.       Follow Up Recommendations No PT follow up;Supervision/Assistance - 24 hour    Equipment Recommendations  Rolling walker with 5" wheels    Recommendations for Other Services       Precautions / Restrictions Restrictions Weight Bearing Restrictions: No      Mobility  Bed Mobility               General bed mobility comments: in recliner at entry  Transfers Overall transfer level: Needs assistance Equipment used: Rolling walker (2 wheeled) Transfers: Sit to/from Stand Sit to Stand: Min guard         General transfer comment: min guard for safety, acent/descent slowed due to pain from chest tube placement  Ambulation/Gait Ambulation/Gait assistance: Min guard Ambulation Distance (Feet): 200 Feet Assistive device: Rolling walker (2 wheeled) Gait Pattern/deviations: Step-through pattern;Decreased stride length Gait velocity: slowed  Gait velocity interpretation: Below normal speed for age/gender General Gait Details: min guard for safety, slow, steady cadence, no LoB      Balance Overall balance assessment: Modified Independent                                            Pertinent Vitals/Pain Pain Assessment: No/denies pain    Home Living Family/patient expects to be discharged to:: Private residence Living Arrangements: Spouse/significant other Available Help at Discharge: Family;Available 24 hours/day Type of Home: House Home Access: Stairs to enter   [employer] of Steps: 2 Home Layout: One level Home Equipment: None      Prior Function Level of Independence: Independent         Comments: community ambulator and driver        Extremity/Trunk Assessment   Upper Extremity Assessment Upper Extremity Assessment: Defer to OT evaluation    Lower Extremity Assessment Lower Extremity Assessment: Overall WFL for tasks assessed;Generalized weakness    Cervical / Trunk Assessment Cervical / Trunk Assessment: Normal  Communication   Communication: No difficulties  Cognition Arousal/Alertness: Awake/alert Behavior During Therapy: WFL for tasks assessed/performed Overall Cognitive Status: Within Functional Limits for tasks assessed                                        General Comments General comments (skin integrity, edema, etc.): at rest in recliner BP 117/70, HR 90bpm, SaO2 on RA 95%O2, with ambulation on RA SaO2 dropped to 88% O2 vc for pursed lipped breathing and SaO2 rebounded to 92% O2, after activity BP 134/71, HR 94 bpm, and SaO2 on  Ra 95% O2        Assessment/Plan    PT Assessment Patient needs continued PT services  PT Problem List Decreased activity tolerance;Cardiopulmonary status limiting activity       PT Treatment Interventions DME instruction;Gait training;Stair training;Functional mobility training;Therapeutic activities;Therapeutic exercise;Balance training;Patient/family education    PT Goals (Current goals can be found in the Care Plan section)  Acute Rehab PT Goals Patient Stated Goal: figure out his CA prognosis  PT Goal Formulation: With  patient Time For Goal Achievement: 08/16/16 Potential to Achieve Goals: Good    Frequency Min 3X/week    AM-PAC PT "6 Clicks" Daily Activity  Outcome Measure Difficulty turning over in bed (including adjusting bedclothes, sheets and blankets)?: A Lot Difficulty moving from lying on back to sitting on the side of the bed? : A Lot Difficulty sitting down on and standing up from a chair with arms (e.g., wheelchair, bedside commode, etc,.)?: A Little Help needed moving to and from a bed to chair (including a wheelchair)?: A Little Help needed walking in hospital room?: A Little Help needed climbing 3-5 steps with a railing? : A Lot 6 Click Score: 15    End of Session Equipment Utilized During Treatment: Gait belt Activity Tolerance: Patient limited by fatigue Patient left: in chair;with call bell/phone within reach;with SCD's reapplied Nurse Communication: Mobility status PT Visit Diagnosis: Muscle weakness (generalized) (M62.81);Other abnormalities of gait and mobility (R26.89)    Time: 8264-1583 PT Time Calculation (min) (ACUTE ONLY): 34 min   Charges:   PT Evaluation $PT Eval Moderate Complexity: 1 Procedure PT Treatments $Gait Training: 8-22 mins   PT G Codes:        Moncerrath Berhe B. Migdalia Dk PT, DPT Acute Rehabilitation  [phone] Pager [phone]    [place] 08/09/2016, 10:12 AM

## 2016-08-09 NOTE — Evaluation (Signed)
Occupational Therapy Evaluation Patient Details Name: Jeremy Chan MRN: [id number] DOB: January 01, 1941 Today's Date: 08/09/2016    History of Present Illness Pt is a 76 yo male admitted with progressive shortness of breath and occasional chest pain for the past few weeks, dx with pleural effusion s/p thorocentesis and pleural biopsy 08/07/16.  Prior medical history significant of OSA, prostate cancer, GERD, HTN, HLD, CAD   Clinical Impression   Pt admitted with above. He demonstrates the below listed deficits and will benefit from continued OT to maximize safety and independence with BADLs.  Pt presents to OT with generalized weakness, and decreased activity tolerance.  He requires min guard assist for ADLs.  He lives with wife, who has MS and uses RW and w/c at home, but has good support from children.  Feel he will progress to mod I, but will need intermittent assist depending on energy levels.  Will follow acutely.       Follow Up Recommendations  No OT follow up;Supervision/Assistance - 24 hour    Equipment Recommendations  Tub/shower seat    Recommendations for Other Services       Precautions / Restrictions Precautions Precautions: None Restrictions Weight Bearing Restrictions: No      Mobility Bed Mobility Overal bed mobility: Needs Assistance Bed Mobility: Sit to Supine       Sit to supine: Supervision      Transfers Overall transfer level: Needs assistance Equipment used: Rolling walker (2 wheeled) Transfers: Sit to/from Stand Sit to Stand: Min guard         General transfer comment: min guard assist for safety     Balance Overall balance assessment: Needs assistance Sitting-balance support: Feet supported Sitting balance-Leahy Scale: Good     Standing balance support: No upper extremity supported;During functional activity Standing balance-Leahy Scale: Fair                             ADL either performed or assessed with clinical  judgement   ADL Overall ADL's : Needs assistance/impaired Eating/Feeding: Independent   Grooming: Wash/dry hands;Wash/dry face;Oral care;Brushing hair;Supervision/safety;Standing   Upper Body Bathing: Set up;Sitting   Lower Body Bathing: Min guard;Sit to/from stand   Upper Body Dressing : Set up;Supervision/safety;Sitting   Lower Body Dressing: Min guard;Sit to/from stand   Toilet Transfer: Min guard;Ambulation;Comfort height toilet;RW;Grab bars   Toileting- Clothing Manipulation and Hygiene: Min guard;Sit to/from stand       Functional mobility during ADLs: Min guard;Rolling walker General ADL Comments: discussed recommendation for seat in shower and reviewed energy conservation techniques      Vision         Perception     Praxis      Pertinent Vitals/Pain Pain Assessment: No/denies pain     Hand Dominance     Extremity/Trunk Assessment Upper Extremity Assessment Upper Extremity Assessment: Generalized weakness   Lower Extremity Assessment Lower Extremity Assessment: Defer to PT evaluation   Cervical / Trunk Assessment Cervical / Trunk Assessment: Normal   Communication Communication Communication: No difficulties   Cognition Arousal/Alertness: Awake/alert Behavior During Therapy: WFL for tasks assessed/performed Overall Cognitive Status: Within Functional Limits for tasks assessed                                     General Comments  VSS    Exercises     Shoulder Instructions  Home Living Family/patient expects to be discharged to:: Private residence Living Arrangements: Spouse/significant other Available Help at Discharge: Family;Available 24 hours/day Type of Home: House Home Access: Stairs to enter [employer] of Steps: 2   Home Layout: One level     Bathroom Shower/Tub: Occupational psychologist: Handicapped height Bathroom Accessibility: Yes   Home Equipment: None   Additional Comments:  Pt's spouse has MS and uses w/c.  He has good support from children       Prior Functioning/Environment Level of Independence: Independent        Comments: Advertising copywriter        OT Problem List: Decreased strength;Decreased activity tolerance;Decreased knowledge of use of DME or AE;Cardiopulmonary status limiting activity      OT Treatment/Interventions: Self-care/ADL training;Therapeutic exercise;Energy conservation;DME and/or AE instruction;Therapeutic activities;Patient/family education    OT Goals(Current goals can be found in the care plan section) Acute Rehab OT Goals Patient Stated Goal: go home  OT Goal Formulation: With patient Time For Goal Achievement: 08/23/16 Potential to Achieve Goals: Good ADL Goals Pt Will Perform Grooming: with modified independence;standing Pt Will Perform Upper Body Bathing: with modified independence;sitting Pt Will Perform Lower Body Bathing: with modified independence;sit to/from stand Pt Will Perform Upper Body Dressing: with modified independence;sitting Pt Will Perform Lower Body Dressing: with modified independence;sit to/from stand Pt Will Transfer to Toilet: with modified independence;ambulating;regular height toilet;bedside commode;grab bars Pt Will Perform Toileting - Clothing Manipulation and hygiene: with modified independence;sit to/from stand Pt Will Perform Tub/Shower Transfer: Shower transfer;with modified independence;ambulating;shower seat;rolling walker  OT Frequency: Min 2X/week   Barriers to D/C:            Co-evaluation              AM-PAC PT "6 Clicks" Daily Activity     Outcome Measure Help from another person eating meals?: None Help from another person taking care of personal grooming?: A Little Help from another person toileting, which includes using toliet, bedpan, or urinal?: A Little Help from another person bathing (including washing, rinsing, drying)?: A Little Help from another  person to put on and taking off regular upper body clothing?: A Little Help from another person to put on and taking off regular lower body clothing?: A Little 6 Click Score: 19   End of Session Equipment Utilized During Treatment: Rolling walker Nurse Communication: Mobility status  Activity Tolerance: Patient limited by fatigue Patient left: in bed;with call bell/phone within reach  OT Visit Diagnosis: Unsteadiness on feet (R26.81)                Time: 1354-1440 OT Time Calculation (min): 46 min Charges:  OT General Charges $OT Visit: 1 Procedure OT Evaluation $OT Eval Moderate Complexity: 1 Procedure OT Treatments $Self Care/Home Management : 8-22 mins $Therapeutic Activity: 8-22 mins G-Codes:     [facility], OTR/L [phone]   Kaelene Elliston M 08/09/2016, 4:11 PM

## 2016-08-09 NOTE — Care Management Note (Addendum)
Case Management Note  Patient Details  Name: Jeremy Chan MRN: [id number] Date of Birth: Apr 10, 1940  Action/Plan: Patient lives at home with spouse who has MS; PCP: Haze Rushing, MD; private insurance with Medicare; patient is to return home when medically stable with PleurX; CM talked to patient about [place] choices, patient chose [place]; Butch Penny with [facility] called for arrangements. Attending MD please enter the face to face for [facility] services.  Expected Discharge Date:      Possibly 08/12/2016            Expected Discharge Plan:  [place]  Discharge planning Services  CM Consult  [facility] Arranged:   [place]:   [facility]  Status of Service:  In process, will continue to follow Sherrilyn Rist [phone] 08/09/2016, 12:58 PM

## 2016-08-09 NOTE — Progress Notes (Signed)
Patient ID: Jeremy Chan, male   DOB: 1940/10/13, 76 y.o.   MRN: [id number]  PROGRESS NOTE    Jeremy Chan  NLZ:[id number] DOB: 08-Mar-1940 DOA: 07/31/2016 PCP: Haze Rushing, MD   Brief Narrative:  8y old male with history of obstructive sleep apnea, prostate cancer, hypertension, hyperlipidemia, coronary artery disease presented to the ED with progressive worsening shortness of breath and occasional chest pain for the past few weeks. Patient noted to have a large left-sided pleural effusion. Pulmonary/critical care consulted and patient underwent a thoracentesis. Cytology from first thoracentesis showed atypical cells. Repeat thoracentesis cytology showed atypical cells concerning for carcinoma. TCTS consulted and patient had VATS, talc pleurodesis and pleural biopsy by [name] on 08/06/2016. Pathology pending.  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 Cytology showed atypical cells - Patient was seen by pulmonary and patient underwent another thoracentesis with 600 mL of bloody pleural fluid obtained and sent for cytology/pathology (08/02/2016) which showed rare atypical cells concerning for carcinoma.Repeat chest x-ray postthoracentesis negative for pneumothorax.  - Due to consolidation noted on CT chest and leukocytosis, patient started empirically on IV antibiotics. Cultures negative. Discontinued antibiotics on 08/08/2016 -  s/p VATs, talc pleurodesis, andpleural biopsy by [name] 08/06/2016 with  pathology pending. Status post chest tube removal on 08/08/2016 by CT surgery. Patient has Pleurx catheter in place. Follow further recommendations from CT surgery. - Oncology consultation with Dr. Julien Nordmann most likely on Friday.   probable community acquired pneumonia - Status post Rocephin and Zithromax. Cultures negative. Monitor off antibiotics  left pneumothorax Noted on chest x-ray postoperatively.  status with chest tube removal; currently has Pleurx catheter.  hypertension Stable. Continue current home regimen of Norvasc, benazepril.  hyperlipidemia Continue statin.  Acute blood loss anemia Hemoglobin stable    DVT prophylaxis: Lovenox Code Status:  Full Family Communication:  none present at bedside Disposition Plan: Home in 1-2  days once cleared by CT surgery  Consultants: CT surgery, PCCM Oncology consultation pending Procedures:  - Diagnostic and therapeutic thoracentesis per Dr. Jimmey Ralph 08/01/2016  - Diagnostic and therapeutic thoracentesis per Dr. Elsworth Soho 08/02/2016  - Left-sided VATS with talc pleurodesis, pleural biopsy, Pleurx catheter placement on 08/06/16  Antimicrobials:  Rocephin and Zithromax from 08/02/2016-08/08/2016   Subjective: Patient seen and examined at bedside. He denies any overnight fever, nausea, vomiting. He feels weaker today.  Objective: Vitals:   08/08/16 2249 08/09/16 0327 08/09/16 0736 08/09/16 1229  BP: 129/67 117/62 116/60 114/61  Pulse: 93 90 88 81  Resp: 20 (!) 21 20 18   Temp: 98.8 F (37.1 C) 98.1 F (36.7 C) 97.6 F (36.4 C) 97.7 F (36.5 C)  TempSrc: Oral Oral Oral Oral  SpO2: 92% 95% 94% 94%  Weight:      Height:        Intake/Output Summary (Last 24 hours) at 08/09/16 1233 Last data filed at 08/09/16 1232  Gross per 24 hour  Intake              240 ml  Output  1300 ml  Net            -1060 ml   Filed Weights   08/05/16 0420 08/06/16 0426 08/07/16 0600  Weight: 97.1 kg (214 lb 1.6  oz) 96.4 kg (212 lb 9.6 oz) 96.9 kg (213 lb 10 oz)    Examination:  General exam: Appears calm and comfortable  Respiratory system: Bilateral decreased breath sound at bases With some scattered crackles Cardiovascular system: S1 & S2 heard, rate controlled Gastrointestinal system: Abdomen is nondistended, soft and nontender. Normal bowel sounds heard. Extremities: No cyanosis, clubbing; trace pitting pedal edema   Data Reviewed: I have personally reviewed following labs and imaging studies  CBC:  Recent Labs Lab 08/03/16 0205  08/04/16 0446 08/06/16 0323 08/07/16 0340 08/08/16 0545 08/09/16 0530  WBC 14.9*  < > 14.6* 12.6* 12.2* 15.0* 14.6*  NEUTROABS 10.8*  --   --   --  9.2*  --  11.1*  HGB 14.1  < > 13.6 12.9* 11.0* 12.5* 12.1*  HCT 42.5  < > 40.9 39.2 33.8* 38.6* 37.5*  MCV 93.4  < > 93.4 92.2 92.6 93.5 92.8  PLT 224  < > 220 270 232 279 298  < > = values in this interval not displayed. Basic Metabolic Panel:  Recent Labs Lab 08/04/16 0446 08/06/16 0323 08/07/16 0530 08/08/16 0545 08/09/16 0530  NA 133* 133* 133* 134* 134*  K 4.3 4.3 4.2 4.1 4.3  CL 98* 101 98* 99* 98*  CO2 27 25 27 27 28   GLUCOSE 108* 115* 104* 102* 98  BUN 11 10 8 8 9   CREATININE 0.80 0.67 0.66 0.62 0.66  CALCIUM 8.5* 8.3* 8.2* 8.2* 8.3*  MG  --   --   --   --  2.0   GFR: Estimated Creatinine Clearance: 88.8 mL/min (by C-G formula based on SCr of 0.66 mg/dL). Liver Function Tests:  Recent Labs Lab 08/04/16 0446 08/08/16 0545  AST 12* 25  ALT 10* 23  ALKPHOS 57 103  BILITOT 0.7 0.9  PROT 5.6* 5.5*  ALBUMIN 2.5* 2.1*   No results for input(s): LIPASE, AMYLASE in the last 168 hours. No results for input(s): AMMONIA in the last 168 hours. Coagulation Profile:  Recent Labs Lab 08/04/16 0446  INR 1.24   Cardiac Enzymes: No results for input(s): CKTOTAL, CKMB, CKMBINDEX, TROPONINI in the last 168 hours. BNP (last 3 results) No results for input(s): PROBNP in the last 8760  hours. HbA1C: No results for input(s): HGBA1C in the last 72 hours. CBG: No results for input(s): GLUCAP in the last 168 hours. Lipid Profile: No results for input(s): CHOL, HDL, LDLCALC, TRIG, CHOLHDL, LDLDIRECT in the last 72 hours. Thyroid Function Tests: No results for input(s): TSH, T4TOTAL, FREET4, T3FREE, THYROIDAB in the last 72 hours. Anemia Panel: No results for input(s): VITAMINB12, FOLATE, FERRITIN, TIBC, IRON, RETICCTPCT in the last 72 hours. Sepsis Labs: No results for input(s): PROCALCITON, LATICACIDVEN in the last 168 hours.  Recent Results (from the past 240 hour(s))  Fungus Culture With Stain     Status: None (Preliminary result)   Collection Time: 07/31/16 11:59 PM  Result Value Ref Range Status   Fungus Stain Final report  Final    Comment: (NOTE) Performed At: [facility] [place], [place] [id number] Lindon Romp MD PY:[id number]    Fungus (Mycology) Culture PENDING  Incomplete   Fungal Source PLEURAL  Final  Fungus Culture Result     Status: None   Collection  Time: 07/31/16 11:59 PM  Result Value Ref Range Status   Result 1 Comment  Final    Comment: (NOTE) KOH/Calcofluor preparation:  no fungus observed. Performed At: [facility] [place], [place] [id number] Lindon Romp MD UX:[id number]   Body fluid culture (includes gram stain)     Status: None   Collection Time: 08/01/16 12:13 AM  Result Value Ref Range Status   Specimen Description PLEURAL FLUID  Final   Special Requests Normal  Final   Gram Stain   Final    RARE WBC PRESENT,BOTH PMN AND MONONUCLEAR NO ORGANISMS SEEN    Culture NO GROWTH 3 DAYS  Final   Report Status 08/04/2016 FINAL  Final  Culture, sputum-assessment     Status: None   Collection Time: 08/03/16 10:40 AM  Result Value Ref Range Status   Specimen Description EXPECTORATED SPUTUM  Final   Special Requests NONE  Final   Sputum evaluation THIS SPECIMEN IS ACCEPTABLE FOR  SPUTUM CULTURE  Final   Report Status 08/04/2016 FINAL  Final  Culture, respiratory (NON-Expectorated)     Status: None   Collection Time: 08/03/16 10:40 AM  Result Value Ref Range Status   Specimen Description EXPECTORATED SPUTUM  Final   Special Requests NONE Reflexed from [id number]  Final   Gram Stain   Final    ABUNDANT WBC PRESENT,BOTH PMN AND MONONUCLEAR FEW GRAM POSITIVE COCCI IN PAIRS FEW GRAM NEGATIVE RODS RARE GRAM POSITIVE RODS    Culture Consistent with normal respiratory flora.  Final   Report Status 08/06/2016 FINAL  Final  Culture, blood (routine x 2) Call MD if unable to obtain prior to antibiotics being given     Status: None   Collection Time: 08/03/16 11:41 AM  Result Value Ref Range Status   Specimen Description BLOOD LEFT HAND  Final   Special Requests IN PEDIATRIC BOTTLE Blood Culture adequate volume  Final   Culture NO GROWTH 5 DAYS  Final   Report Status 08/08/2016 FINAL  Final  Culture, blood (routine x 2) Call MD if unable to obtain prior to antibiotics being given     Status: None   Collection Time: 08/03/16 11:41 AM  Result Value Ref Range Status   Specimen Description BLOOD RIGHT ANTECUBITAL  Final   Special Requests IN PEDIATRIC BOTTLE Blood Culture adequate volume  Final   Culture NO GROWTH 5 DAYS  Final   Report Status 08/08/2016 FINAL  Final  Surgical pcr screen     Status: None   Collection Time: 08/05/16  9:27 PM  Result Value Ref Range Status   MRSA, PCR NEGATIVE NEGATIVE Final   Staphylococcus aureus NEGATIVE NEGATIVE Final    Comment:        The Xpert SA Assay (FDA approved for NASAL specimens in patients over 40 years of age), is one component of a comprehensive surveillance program.  Test performance has been validated by [facility] for patients greater than or equal to 23 year old. It is not intended to diagnose infection nor to guide or monitor treatment.   Aerobic/Anaerobic Culture (surgical/deep wound)     Status: None  (Preliminary result)   Collection Time: 08/06/16  4:33 PM  Result Value Ref Range Status   Specimen Description PLEURAL LEFT  Final   Special Requests PT ON ZINACEF  Final   Gram Stain NO WBC SEEN NO ORGANISMS SEEN   Final   Culture NO GROWTH 2 DAYS  Final   Report Status PENDING  Incomplete  Acid Fast Smear (AFB)     Status: None   Collection Time: 08/06/16  4:33 PM  Result Value Ref Range Status   AFB Specimen Processing Concentration  Final   Acid Fast Smear Negative  Final    Comment: (NOTE) Performed At: [facility] [place], [place] [id number] Lindon Romp MD AG:[id number]    Source (AFB) PLEURAL  Final    Comment: LEFT         Radiology Studies: Dg Chest 2 View  Result Date: 08/09/2016 CLINICAL DATA:  Follow-up left pneumothorax. History of bronchoscopy, pleural biopsy, and talc pleurodesis on August 06, 2016 EXAM: CHEST  2 VIEW COMPARISON:  Portable chest x-ray of August 08, 2016. FINDINGS: The right lung is well-expanded and clear. On the left there is persistent volume loss. No definite pneumothorax is observed today. There is pleural fluid and/or pleural thickening along the lateral pleural surface and at the left lung base. The small caliber chest tube is coiled just above the left lateral costophrenic gutter. The large chest tube is been removed. The cardiac silhouette is partially obscured. The pulmonary vascularity is not clearly engorged. The right internal jugular venous catheter tip projects over the proximal SVC. IMPRESSION: Slight interval improvement in the aeration of both lungs. Considerable volume loss on the left is present with pleural thickening and/or pleural effusion laterally and at the lung base. No definite pneumothorax is observed though a tiny left apical pneumothorax cannot be absolutely excluded. The small caliber chest tube is coiled at over the lateral costophrenic angle region. The larger chest tube has been removed.  Electronically Signed   By: David  Martinique M.D.   On: 08/09/2016 07:33   Dg Chest Port 1 View  Result Date: 08/08/2016 CLINICAL DATA:  Chest tube EXAM: PORTABLE CHEST 1 VIEW COMPARISON:  08/07/2016 FINDINGS: Left chest tube is in place. The side port remains in the chest wall. No visible pneumothorax. Moderate left pleural effusion. Probable PleurX drainage catheter projects over the left lower hemithorax. Right lung is clear. Cardiomegaly. Diffuse left lung airspace disease is unchanged. IMPRESSION: Left PleurX catheter and second left chest tube remain in place, unchanged. The left chest tube side port is in the chest wall soft tissues. No visible pneumothorax. Continued diffuse left lung airspace disease and moderate left effusion. Electronically Signed   By: Rolm Baptise M.D.   On: 08/08/2016 07:56        Scheduled Meds: . acetaminophen  1,000 mg Oral Q6H   Or  . acetaminophen (TYLENOL) oral liquid 160 mg/5 mL  1,000 mg Oral Q6H  . amLODipine  5 mg Oral Daily  . atorvastatin  40 mg Oral q1800  . benazepril  20 mg Oral Daily  . bisacodyl  10 mg Oral Daily  . enoxaparin (LOVENOX) injection  30 mg Subcutaneous Q24H  . guaiFENesin  600 mg Oral BID  . polyethylene glycol  17 g Oral Daily  . senna-docusate  1 tablet Oral BID  . sodium chloride flush  3 mL Intravenous Q12H  . sodium chloride flush  3 mL Intravenous Q12H   Continuous Infusions: . sodium chloride 10 mL/hr at 08/07/16 1000  . sodium chloride    . lactated ringers Stopped (08/07/16 0100)  . potassium chloride       LOS: 8 days        Aline August, MD [facility] Pager [phone]  If 7PM-7AM, please contact night-coverage [email or web address] Password [facility] 08/09/2016, 12:33 PM

## 2016-08-09 NOTE — Plan of Care (Signed)
Problem: Safety: Goal: Ability to remain free from injury will improve Outcome: Progressing No falls during this admission. Call bell within reach. Bed in low and locked position. Patient alert and oriented. Clean and clear environment maintained. Nonskid footwear being utilized. 3/4 siderails in place. Patient verbalized understanding of safety instruction.  Problem: Pain Managment: Goal: General experience of comfort will improve Outcome: Progressing Pain being managed with PO scheduled and PRN pain medication. No facial grimacing or moaning evident. Vital signs are stable.

## 2016-08-10 DIAGNOSIS — C3490 Malignant neoplasm of unspecified part of unspecified bronchus or lung: Principal | ICD-10-CM

## 2016-08-10 DIAGNOSIS — J910 Malignant pleural effusion: Secondary | ICD-10-CM

## 2016-08-10 LAB — CBC WITH DIFFERENTIAL/PLATELET
BASOS ABS: 0 10*3/uL (ref 0.0–0.1)
BASOS PCT: 0 %
Eosinophils Absolute: 0.2 10*3/uL (ref 0.0–0.7)
Eosinophils Relative: 2 %
HEMATOCRIT: 35.8 % — AB (ref 39.0–52.0)
HEMOGLOBIN: 12 g/dL — AB (ref 13.0–17.0)
Lymphocytes Relative: 8 %
Lymphs Abs: 1 10*3/uL (ref 0.7–4.0)
MCH: 30.9 pg (ref 26.0–34.0)
MCHC: 33.5 g/dL (ref 30.0–36.0)
MCV: 92.3 fL (ref 78.0–100.0)
MONO ABS: 1.5 10*3/uL — AB (ref 0.1–1.0)
Monocytes Relative: 12 %
NEUTROS ABS: 10.1 10*3/uL — AB (ref 1.7–7.7)
NEUTROS PCT: 78 %
Platelets: 302 10*3/uL (ref 150–400)
RBC: 3.88 MIL/uL — ABNORMAL LOW (ref 4.22–5.81)
RDW: 13.6 % (ref 11.5–15.5)
WBC: 12.8 10*3/uL — ABNORMAL HIGH (ref 4.0–10.5)

## 2016-08-10 LAB — BASIC METABOLIC PANEL
Anion gap: 8 (ref 5–15)
BUN: 11 mg/dL (ref 6–20)
CALCIUM: 8.3 mg/dL — AB (ref 8.9–10.3)
CO2: 26 mmol/L (ref 22–32)
CREATININE: 0.68 mg/dL (ref 0.61–1.24)
Chloride: 98 mmol/L — ABNORMAL LOW (ref 101–111)
GFR calc non Af Amer: >60 mL/min (ref >60)
Glucose, Bld: 101 mg/dL — ABNORMAL HIGH (ref 65–99)
Potassium: 4.1 mmol/L (ref 3.5–5.1)
Sodium: 132 mmol/L — ABNORMAL LOW (ref 135–145)

## 2016-08-10 LAB — MAGNESIUM: MAGNESIUM: 2.1 mg/dL (ref 1.7–2.4)

## 2016-08-10 MED ORDER — POLYETHYLENE GLYCOL 3350 17 G PO PACK: 17.0000 g | PACK | Freq: Every day | ORAL | 0 refills | Status: DC

## 2016-08-10 MED ORDER — HYDROCODONE-ACETAMINOPHEN 5-325 MG PO TABS: 1.0000 | ORAL_TABLET | Freq: Four times a day (QID) | ORAL | 0 refills | Status: DC | PRN

## 2016-08-10 MED ORDER — ONDANSETRON HCL 4 MG PO TABS: 4.0000 mg | ORAL_TABLET | Freq: Four times a day (QID) | ORAL | 0 refills | Status: DC | PRN

## 2016-08-10 MED ORDER — SENNOSIDES-DOCUSATE SODIUM 8.6-50 MG PO TABS: 1.0000 | ORAL_TABLET | Freq: Two times a day (BID) | ORAL | 0 refills | Status: DC

## 2016-08-10 MED ORDER — BISACODYL 10 MG RE SUPP: 10.0000 mg | Freq: Every day | RECTAL | 0 refills | Status: DC | PRN

## 2016-08-10 NOTE — Progress Notes (Signed)
[address]       ,[place] [id number]             [phone]                 4 Days Post-Op Procedure(s) (LRB): VIDEO ASSISTED THORACOSCOPY, left (Left) TALC PLEURADESIS (Left) PLEURAL BIOPSY (Left) VIDEO BRONCHOSCOPY (N/A) INSERTION PLEURAL DRAINAGE CATHETER, left (Left)  LOS: 9 days   Subjective: awakie and alert, patient discussed stage and treatment with Dr Julien Nordmann   Objective: Vital signs in last 24 hours: Patient Vitals for the past 24 hrs:  BP Temp Temp src Pulse Resp SpO2  08/10/16 0717 118/62 97.9 F (36.6 C) Oral 81 14 95 %  08/10/16 0400 132/66 98 F (36.7 C) Oral 91 18 94 %  08/09/16 2257 134/76 98.4 F (36.9 C) Oral 87 (!) 23 96 %  08/09/16 1934 131/69 98.4 F (36.9 C) Oral 86 19 96 %  08/09/16 1513 106/60 98.1 F (36.7 C) Oral 84 16 95 %  08/09/16 1229 114/61 97.7 F (36.5 C) Oral 81 18 94 %    Filed Weights   08/05/16 0420 08/06/16 0426 08/07/16 0600  Weight: 214 lb 1.6 oz (97.1 kg) 212 lb 9.6 oz (96.4 kg) 213 lb 10 oz (96.9 kg)    Hemodynamic parameters for last 24 hours:    Intake/Output from previous day: 07/05 0701 - 07/06 0700 In: 243 [P.O.:240; I.V.:3] Out: 950 [Urine:950] Intake/Output this shift: Total I/O In: -  Out: 375 [Urine:375]  Scheduled Meds: . acetaminophen  1,000 mg Oral Q6H   Or  . acetaminophen (TYLENOL) oral liquid 160 mg/5 mL  1,000 mg Oral Q6H  . amLODipine  5 mg Oral Daily  . atorvastatin  40 mg Oral q1800  . benazepril  20 mg Oral Daily  . bisacodyl  10 mg Oral Daily  . enoxaparin (LOVENOX) injection  30 mg Subcutaneous Q24H  . guaiFENesin  600 mg Oral BID  . polyethylene glycol  17 g Oral Daily  . senna-docusate  1 tablet Oral BID  . sodium chloride flush  3 mL Intravenous Q12H  . sodium chloride flush  3 mL Intravenous Q12H   Continuous Infusions: . sodium chloride 10 mL/hr at 08/07/16 1000  . sodium chloride    . lactated ringers Stopped (08/07/16 0100)  . potassium chloride      PRN Meds:.sodium chloride, acetaminophen **OR** acetaminophen, albuterol, bisacodyl, HYDROcodone-acetaminophen, levalbuterol, ondansetron **OR** ondansetron (ZOFRAN) IV, oxyCODONE, potassium chloride, sodium chloride flush, traMADol  General appearance: alert and cooperative Neurologic: intact Heart: regular rate and rhythm, S1, S2 normal, no murmur, click, rub or gallop Lungs: diminished breath sounds LLL Abdomen: soft, non-tender; bowel sounds normal; no masses,  no organomegaly Extremities: extremities normal, atraumatic, no cyanosis or edema and Homans sign is negative, no sign of DVT Wound: dressing intact  Lab Results: CBC: Recent Labs  08/09/16 0530 08/10/16 0430  WBC 14.6* 12.8*  HGB 12.1* 12.0*  HCT 37.5* 35.8*  PLT 298 302   BMET:  Recent Labs  08/09/16 0530 08/10/16 0430  NA 134* 132*  K 4.3 4.1  CL 98* 98*  CO2 28 26  GLUCOSE 98 101*  BUN 9 11  CREATININE 0.66 0.68  CALCIUM 8.3* 8.3*    PT/INR: No results for input(s): LABPROT, INR in the last 72 hours.   Radiology Dg Chest 2 View  Result Date: 08/09/2016 CLINICAL DATA:  Follow-up left pneumothorax. History of bronchoscopy, pleural biopsy, and  talc pleurodesis on August 06, 2016 EXAM: CHEST  2 VIEW COMPARISON:  Portable chest x-ray of August 08, 2016. FINDINGS: The right lung is well-expanded and clear. On the left there is persistent volume loss. No definite pneumothorax is observed today. There is pleural fluid and/or pleural thickening along the lateral pleural surface and at the left lung base. The small caliber chest tube is coiled just above the left lateral costophrenic gutter. The large chest tube is been removed. The cardiac silhouette is partially obscured. The pulmonary vascularity is not clearly engorged. The right internal jugular venous catheter tip projects over the proximal SVC. IMPRESSION: Slight interval improvement in the aeration of both lungs. Considerable volume loss on the left is present with  pleural thickening and/or pleural effusion laterally and at the lung base. No definite pneumothorax is observed though a tiny left apical pneumothorax cannot be absolutely excluded. The small caliber chest tube is coiled at over the lateral costophrenic angle region. The larger chest tube has been removed. Electronically Signed   By: David  Martinique M.D.   On: 08/09/2016 07:33     Assessment/Plan: S/P Procedure(s) (LRB): VIDEO ASSISTED THORACOSCOPY, left (Left) TALC PLEURADESIS (Left) PLEURAL BIOPSY (Left) VIDEO BRONCHOSCOPY (N/A) INSERTION PLEURAL DRAINAGE CATHETER, left (Left) Mobilize Path still pending Has follow up in oncology, will get mri and pet Not much coming from pleurix suspect will not need for long  discussed plans with patient and daughter  Home today, home health instructions for pleurix already ordered   [name] MD 08/10/2016 10:32 AM

## 2016-08-10 NOTE — Progress Notes (Signed)
Physical Therapy Treatment Patient Details Name: Jeremy Chan MRN: [id number] DOB: [birth year] Today's Date: 08/10/2016    History of Present Illness Pt is a 76 yo male admitted with progressive shortness of breath and occasional chest pain for the past few weeks, dx with pleural effusion s/p thorocentesis and pleural biopsy 08/07/16.  Prior medical history significant of OSA, prostate cancer, GERD, HTN, HLD, CAD    PT Comments    Pt is making good progress towards his goals. Pt currently supervision for bed mobility, and min guard for transfers, ambulation of 350 feet and ascent/descent of 10 steps. Pt requires skilled PT to continue to progress gait training and improving endurance to safely mobilize in his home environment at discharge.    Follow Up Recommendations  No PT follow up;Supervision/Assistance - 24 hour     Equipment Recommendations  Rolling walker with 5" wheels       Precautions / Restrictions Restrictions Weight Bearing Restrictions: No    Mobility  Bed Mobility Overal bed mobility: Needs Assistance Bed Mobility: Sit to Supine       Sit to supine: Supervision   General bed mobility comments: supervision for safety  Transfers Overall transfer level: Needs assistance Equipment used: Rolling walker (2 wheeled) Transfers: Sit to/from Stand Sit to Stand: Min guard         General transfer comment: min guard for safety, good power up and controlled lowering to toilet after ambulation  Ambulation/Gait Ambulation/Gait assistance: Min guard Ambulation Distance (Feet): 350 Feet Assistive device: None Gait Pattern/deviations: Step-through pattern;Decreased stride length Gait velocity: slowed  Gait velocity interpretation: Below normal speed for age/gender General Gait Details: min guard for safety, slow, steady cadence, no LoB, pt with further decreased cadence by end of session due to fatigue but continued to be steady    Stairs Stairs: Yes   Stair  Management: One rail Left;Forwards;Step to pattern Number of Stairs: 10 General stair comments: slow, steady, ascent/descent of stairs, 1 min standing rest break at top of steps to regain breath SaO2 remained above 94%O2 on RA through out     Balance Overall balance assessment: Modified Independent Sitting-balance support: Feet supported Sitting balance-Leahy Scale: Good     Standing balance support: No upper extremity supported;During functional activity Standing balance-Leahy Scale: Fair                              Cognition Arousal/Alertness: Awake/alert Behavior During Therapy: WFL for tasks assessed/performed Overall Cognitive Status: Within Functional Limits for tasks assessed                                           General Comments General comments (skin integrity, edema, etc.): at rest BP 132/66, HR 87bpm, SaO2 on RA 95% O2, after ambulation HR 103 bpm, SaO2 on RA 98%O2, pt daughter in room and educated on need for hourly small bouts of exercise mainly walking to help with SoB from decreased lung function as well as use of incentive spirometer      Pertinent Vitals/Pain Pain Assessment: No/denies pain    Home Living Family/patient expects to be discharged to:: Private residence Living Arrangements: Spouse/significant other Available Help at Discharge: Family;Available 24 hours/day Type of Home: House Home Access: Stairs to enter   Home Layout: One level Home Equipment: None      Prior  Function Level of Independence: Independent      Comments: community ambulator and driver   PT Goals (current goals can now be found in the care plan section) Acute Rehab PT Goals Patient Stated Goal: figure out his CA prognosis  PT Goal Formulation: With patient Time For Goal Achievement: 08/16/16 Potential to Achieve Goals: Good Progress towards PT goals: Progressing toward goals    Frequency    Min 3X/week      PT Plan Current plan  remains appropriate       AM-PAC PT "6 Clicks" Daily Activity  Outcome Measure  Difficulty turning over in bed (including adjusting bedclothes, sheets and blankets)?: A Little Difficulty moving from lying on back to sitting on the side of the bed? : A Little Difficulty sitting down on and standing up from a chair with arms (e.g., wheelchair, bedside commode, etc,.)?: A Little Help needed moving to and from a bed to chair (including a wheelchair)?: A Little Help needed walking in hospital room?: A Little Help needed climbing 3-5 steps with a railing? : A Little 6 Click Score: 18    End of Session Equipment Utilized During Treatment: Gait belt Activity Tolerance: Patient limited by fatigue Patient left: in chair;with call bell/phone within reach;with SCD's reapplied Nurse Communication: Mobility status PT Visit Diagnosis: Muscle weakness (generalized) (M62.81);Other abnormalities of gait and mobility (R26.89)     Time: 3276-1470 PT Time Calculation (min) (ACUTE ONLY): 32 min  Charges:  $Gait Training: 23-37 mins                    G Codes:       Tiya Schrupp B. Migdalia Dk PT, DPT Acute Rehabilitation  [phone] Pager [phone]     [place] 08/10/2016, 1:56 PM

## 2016-08-10 NOTE — Progress Notes (Signed)
Jeremy Chan with [facility] called concerning home today with PleurX vac- she is to see the patient today prior to discharge home; Aneta Mins [phone]

## 2016-08-10 NOTE — Plan of Care (Signed)
Problem: Bowel/Gastric: Goal: Will not experience complications related to bowel motility Outcome: Progressing Giving tap water enema, promoting ambulation, colace, dulcolax and miralax given as prescribed. Pt not c/o constipation, states bowel movements are irregular.

## 2016-08-10 NOTE — Progress Notes (Addendum)
DC instructions given to patient and daughter at bedside. Questions answered. VSS. CCMD and eICU notified of DCReiterated importance of medication regimen, home health and personal checking of pleur-x site for infection, bathing and restrictions, and follow up appointments. Rx given to patient. R IJ DC, hemostasis achieved, no complications. PIV DC, hemostasis achieved.   DC delayed d/t central line removal/bedrest, PT session and attempted BM.   Pt had small bowel movement and declined enema at this time, reinforced taking of bowel regimen.   1600: Pt sleeping in room with daughter at bedside. Awaiting storm to recess. Pt DC at this time.

## 2016-08-10 NOTE — Progress Notes (Signed)
[place] [phone]   [phone]  CONSULT NOTE  REFERRING PHYSICIAN: Dr. Aline August  REASON FOR CONSULTATION:  76 years old white male recently diagnosed with lung cancer.  HPI Jeremy Chan is a 76 y.o. male with past medical history significant for history of prostate cancer, hypertension, dyslipidemia, coronary artery disease, GERD, dyslipidemia as well as remote history of smoking but quit in 1986. The patient presented to the emergency department at [facility] complaining of progressive shortness of breath with intermittent chest pain for few weeks. Chest x-ray on 07/31/2016 showed very large left pleural effusion with associated passive atelectasis involving the left lung with only the left apex having residual. The patient underwent left thoracentesis under the care of Dr. Elsworth Soho with drainage of 600 ML of bloody pleural fluid. The cytology showed atypical cells suspicious for non-small cell carcinoma. CT of the chest without contrast on 08/02/2016 showed persistent moderate left pleural effusion with left lower lobe consolidation. There was some mild expansion edema noted in the right upper lobe and no pneumothorax was noted. The scan also showed multiple bilateral lung nodules the largest of these measured 1.1 cm bilaterally. The patient was seen by [name] and on 08/06/2016 he underwent bronchoscopy with left VATS and drainage of the left pleural effusion, multiple pleural biopsies and placement of Pleurx catheter. The final pathology of this procedure is a still pending. The patient is feeling much better today. He is expected to be discharged from the hospital later today. He denied having any fever or chills. He has no nausea, vomiting, diarrhea or constipation. He has no recent weight loss or night sweats. He denied having any chest pain but continues to have shortness breath with exertion with mild cough and no hemoptysis. He  denied having any headache or visual changes. Family history significant for multiple family members including his father and brother with prostate cancer. The patient is married and has 3 children. He worked in several jobs in the past. He has a history of heavy smoking up to 3 packs per day for around 30 years but quit in 1986. He denied having any recent history of alcohol or drug abuse.  HPI  Past Medical History:  Diagnosis Date  . Age-related bone loss   . Erectile dysfunction   . GERD (gastroesophageal reflux disease)   . Hyperkalemia   . Hyperlipidemia   . Hypertension   . Other dietary vitamin B12 deficiency anemia   . Prostate cancer ([place])   . Urinary tract infection   . Vitamin D deficiency     Past Surgical History:  Procedure Laterality Date  . CHEST TUBE INSERTION Left 08/06/2016   Procedure: INSERTION PLEURAL DRAINAGE CATHETER, left;  Surgeon: [name], MD;  Location: [place];  Service: Thoracic;  Laterality: Left;  . PLEURAL BIOPSY Left 08/06/2016   Procedure: PLEURAL BIOPSY;  Surgeon: [name], MD;  Location: [place];  Service: Thoracic;  Laterality: Left;  . TALC PLEURODESIS Left 08/06/2016   Procedure: Pietro Cassis;  Surgeon: [name], MD;  Location: [place];  Service: Thoracic;  Laterality: Left;  Marland Kitchen VIDEO ASSISTED THORACOSCOPY Left 08/06/2016   Procedure: VIDEO ASSISTED THORACOSCOPY, left;  Surgeon: [name], MD;  Location: [place];  Service: Thoracic;  Laterality: Left;  Marland Kitchen VIDEO BRONCHOSCOPY N/A 08/06/2016   Procedure: VIDEO BRONCHOSCOPY;  Surgeon: [name], MD;  Location: [place];  Service: Thoracic;  Laterality: N/A;  Family History  Problem Relation Age of Onset  . Prostate cancer Father   . Prostate cancer Brother   . Diabetes Other   . CAD Neg Hx   . Stroke Neg Hx     Social History Social History  Substance Use Topics  . Smoking status: Former Smoker    Types: Cigarettes    Quit date: 96  . Smokeless  tobacco: Never Used  . Alcohol use Yes    No Known Allergies  Current Facility-Administered Medications  Medication Dose Route Frequency Provider Last Rate Last Dose  . 0.45 % sodium chloride infusion   Intravenous Continuous [name], MD 10 mL/hr at 08/07/16 1000    . 0.9 %  sodium chloride infusion  250 mL Intravenous PRN Toy Baker, MD      . acetaminophen (TYLENOL) tablet 1,000 mg  1,000 mg Oral Q6H Barrett, Erin R, PA-C   1,000 mg at 08/09/16 1812   Or  . acetaminophen (TYLENOL) solution 1,000 mg  1,000 mg Oral Q6H Barrett, Erin R, PA-C      . acetaminophen (TYLENOL) tablet 650 mg  650 mg Oral Q6H PRN Doutova, Anastassia, MD       Or  . acetaminophen (TYLENOL) suppository 650 mg  650 mg Rectal Q6H PRN Doutova, Anastassia, MD      . albuterol (PROVENTIL) (2.5 MG/3ML) 0.083% nebulizer solution 2.5 mg  2.5 mg Nebulization Q2H PRN Doutova, Anastassia, MD      . amLODipine (NORVASC) tablet 5 mg  5 mg Oral Daily Doutova, Anastassia, MD   5 mg at 08/09/16 0907  . atorvastatin (LIPITOR) tablet 40 mg  40 mg Oral q1800 Toy Baker, MD   40 mg at 08/09/16 1813  . benazepril (LOTENSIN) tablet 20 mg  20 mg Oral Daily Doutova, Anastassia, MD   20 mg at 08/09/16 0906  . bisacodyl (DULCOLAX) EC tablet 10 mg  10 mg Oral Daily Barrett, Erin R, PA-C   10 mg at 08/09/16 0907  . bisacodyl (DULCOLAX) suppository 10 mg  10 mg Rectal Daily PRN Aline August, MD      . enoxaparin (LOVENOX) injection 30 mg  30 mg Subcutaneous Q24H [name], MD   30 mg at 08/09/16 0906  . guaiFENesin (MUCINEX) 12 hr tablet 600 mg  600 mg Oral BID Toy Baker, MD   600 mg at 08/09/16 2153  . HYDROcodone-acetaminophen (NORCO/VICODIN) 5-325 MG per tablet 1-2 tablet  1-2 tablet Oral Q4H PRN Toy Baker, MD   2 tablet at 08/10/16 0335  . lactated ringers infusion   Intravenous Continuous Albertha Ghee, MD   Stopped at 08/07/16 0100  . levalbuterol (XOPENEX) nebulizer solution  0.63 mg  0.63 mg Nebulization Q6H PRN Eugenie Filler, MD      . ondansetron [facility]) tablet 4 mg  4 mg Oral Q6H PRN Toy Baker, MD       Or  . ondansetron (ZOFRAN) injection 4 mg  4 mg Intravenous Q6H PRN Doutova, Anastassia, MD      . oxyCODONE (Oxy IR/ROXICODONE) immediate release tablet 5-10 mg  5-10 mg Oral Q4H PRN Barrett, Erin R, PA-C   10 mg at 08/08/16 1924  . polyethylene glycol (MIRALAX / GLYCOLAX) packet 17 g  17 g Oral Daily Aline August, MD   17 g at 08/09/16 0906  . potassium chloride 10 mEq in 50 mL *CENTRAL LINE* IVPB  10 mEq Intravenous Daily PRN Barrett, Erin R, PA-C      . senna-docusate (  Senokot-S) tablet 1 tablet  1 tablet Oral BID Eugenie Filler, MD   1 tablet at 08/09/16 2153  . sodium chloride flush (NS) 0.9 % injection 3 mL  3 mL Intravenous Q12H Toy Baker, MD   3 mL at 08/09/16 2153  . sodium chloride flush (NS) 0.9 % injection 3 mL  3 mL Intravenous Q12H Toy Baker, MD   3 mL at 08/09/16 2153  . sodium chloride flush (NS) 0.9 % injection 3 mL  3 mL Intravenous PRN Doutova, Anastassia, MD      . traMADol (ULTRAM) tablet 50-100 mg  50-100 mg Oral Q6H PRN Barrett, Erin R, PA-C   100 mg at 08/07/16 0050    Review of Systems  Constitutional: positive for anorexia and fatigue Eyes: negative Ears, nose, mouth, throat, and face: negative Respiratory: positive for cough and dyspnea on exertion Cardiovascular: negative Gastrointestinal: negative Genitourinary:negative Integument/breast: negative Hematologic/lymphatic: negative Musculoskeletal:negative Neurological: negative Behavioral/Psych: negative Endocrine: negative Allergic/Immunologic: negative  Physical Exam  BJY:NWGNF, healthy, no distress, well nourished, well developed and anxious SKIN: skin color, texture, turgor are normal, no rashes or significant lesions HEAD: Normocephalic, No masses, lesions, tenderness or abnormalities EYES: normal, PERRLA, Conjunctiva are  pink and non-injected EARS: External ears normal, Canals clear OROPHARYNX:no exudate, no erythema and lips, buccal mucosa, and tongue normal  NECK: supple, no adenopathy, no JVD LYMPH:  no palpable lymphadenopathy, no hepatosplenomegaly LUNGS: decreased breath sounds, expiratory wheezes bilaterally HEART: regular rate & rhythm, no murmurs and no gallops ABDOMEN:abdomen soft, non-tender, normal bowel sounds and no masses or organomegaly BACK: Back symmetric, no curvature., No CVA tenderness EXTREMITIES:no joint deformities, effusion, or inflammation, no edema, no skin discoloration  NEURO: alert & oriented x 3 with fluent speech, no focal motor/sensory deficits  PERFORMANCE STATUS: ECOG 1  LABORATORY DATA: Lab Results  Component Value Date   WBC 12.8 (H) 08/10/2016   HGB 12.0 (L) 08/10/2016   HCT 35.8 (L) 08/10/2016   MCV 92.3 08/10/2016   PLT 302 08/10/2016    @LASTCHEM @  RADIOGRAPHIC STUDIES: Dg Chest 2 View  Result Date: 08/09/2016 CLINICAL DATA:  Follow-up left pneumothorax. History of bronchoscopy, pleural biopsy, and talc pleurodesis on August 06, 2016 EXAM: CHEST  2 VIEW COMPARISON:  Portable chest x-ray of August 08, 2016. FINDINGS: The right lung is well-expanded and clear. On the left there is persistent volume loss. No definite pneumothorax is observed today. There is pleural fluid and/or pleural thickening along the lateral pleural surface and at the left lung base. The small caliber chest tube is coiled just above the left lateral costophrenic gutter. The large chest tube is been removed. The cardiac silhouette is partially obscured. The pulmonary vascularity is not clearly engorged. The right internal jugular venous catheter tip projects over the proximal SVC. IMPRESSION: Slight interval improvement in the aeration of both lungs. Considerable volume loss on the left is present with pleural thickening and/or pleural effusion laterally and at the lung base. No definite pneumothorax  is observed though a tiny left apical pneumothorax cannot be absolutely excluded. The small caliber chest tube is coiled at over the lateral costophrenic angle region. The larger chest tube has been removed. Electronically Signed   By: David  Martinique M.D.   On: 08/09/2016 07:33   Dg Chest 2 View  Result Date: 08/06/2016 CLINICAL DATA:  76 year old male with pleural effusion. Preoperative exam. Subsequent encounter. EXAM: CHEST  2 VIEW COMPARISON:  08/02/2016 CT and chest x-ray. FINDINGS: Moderate size left-sided pleural effusion with left  lower lobe and lingular consolidation. CT detected pulmonary nodules not well demonstrated by plain film exam. No pneumothorax. Heart size within normal limits.  Calcified aorta. Ankylosis portion of thoracic spine with prominent osteophyte. IMPRESSION: Moderate size left-sided pleural effusion with left lower lobe and lingular consolidation. CT detected pulmonary nodules not well demonstrated by plain film exam. Electronically Signed   By: Genia Del M.D.   On: 08/06/2016 07:37   Dg Chest 2 View  Result Date: 08/02/2016 CLINICAL DATA:  Follow-up pleural effusion EXAM: CHEST  2 VIEW COMPARISON:  Chest radiograph from one day prior. FINDINGS: Stable cardiomediastinal silhouette with normal heart size. No pneumothorax. Moderate to large left pleural effusion, slightly decreased. No right pleural effusion. Stable left lung base consolidation. No new focal consolidative airspace disease. IMPRESSION: 1. Moderate to large left pleural effusion, slightly decreased . 2. Stable left lung base consolidation, favor atelectasis, recommend follow-up to resolution. Electronically Signed   By: Ilona Sorrel M.D.   On: 08/02/2016 07:04   Dg Chest 2 View  Result Date: 07/31/2016 CLINICAL DATA:  76 year old with 2-3 day history of chest pain, shortness of breath and cough. EXAM: CHEST  2 VIEW COMPARISON:  05/30/2010. FINDINGS: Very large left pleural effusion with residual aeration of  only the left lung apex. The large effusion obscures the cardiac silhouette. Right lung clear. No right pleural effusion. Degenerative changes and DISH involving the thoracic spine. IMPRESSION: Very large left pleural effusion and associated passive atelectasis involving the left lung with only the left apex having residual aeration. Right lung clear. Electronically Signed   By: Evangeline Dakin M.D.   On: 07/31/2016 18:49   Ct Chest Wo Contrast  Result Date: 08/02/2016 CLINICAL DATA:  Shortness of breath, known left pleural effusion EXAM: CT CHEST WITHOUT CONTRAST TECHNIQUE: Multidetector CT imaging of the chest was performed following the standard protocol without IV contrast. COMPARISON:  08/02/2016 FINDINGS: Cardiovascular: Somewhat limited due to the lack of IV contrast. Aortic calcifications are noted. No aneurysmal dilatation is noted. Coronary calcifications are seen. No significant cardiac enlargement is noted. Mediastinum/Nodes: The esophagus is within normal limits. Scattered small mediastinal and hilar lymph nodes are seen. None of these are significant by size criteria. Lungs/Pleura: Moderate left pleural effusion remains similar to that seen on prior plain film examination. Considerable consolidation within the lower lobe is seen. Few small subpleural nodules are noted measuring less than 5 mm. Some patchy changes are noted in the left upper lobe which may be related to some re-expansion edema. A pleural-based nodule measuring 11 mm is noted on the left on image number 37 of series 8. The right lung shows some subpleural nodules. The largest of these measures 11 mm in the right lower lobe on image number 95 of series 8. Upper Abdomen: No acute abnormality is noted in the upper abdomen. Musculoskeletal: Degenerative changes of the thoracic spine are seen. IMPRESSION: Persistent moderate left pleural effusion with left lower lobe consolidation. Some mild re-expansion edema is noted in the right upper  lobe. No pneumothorax is noted. Multiple bilateral lung nodules. The largest of these measures 11 mm bilaterally. Non-contrast chest CT at 3-6 months is recommended. If the nodules are stable at time of repeat CT, then future CT at 18-24 months (from today's scan) is considered optional for low-risk patients, but is recommended for high-risk patients. This recommendation follows the consensus statement: Guidelines for Management of Incidental Pulmonary Nodules Detected on CT Images: From the Fleischner Society 2017; Radiology 2017; 284:228-243. These  results will be called to the ordering clinician or representative by the Radiologist Assistant, and communication documented in the PACS or zVision Dashboard. Aortic Atherosclerosis (ICD10-I70.0). Electronically Signed   By: Inez Catalina M.D.   On: 08/02/2016 16:05   Dg Chest Right Decubitus  Result Date: 07/31/2016 CLINICAL DATA:  Shortness of breath EXAM: CHEST - RIGHT DECUBITUS COMPARISON:  07/31/2016 FINDINGS: Right side down decubitus view. Large left pleural effusion with small amount of aerated lung at the left apex, fluid appears mobile given change in location of the aerated lung. Unable to comment on cardiomediastinal silhouette as it is obscured. Fluid level in the left upper quadrant is probably in the stomach IMPRESSION: Large left pleural effusion with minimal aeration of the left lung apex. Electronically Signed   By: Donavan Foil M.D.   On: 07/31/2016 21:54   Dg Chest Port 1 View  Result Date: 08/08/2016 CLINICAL DATA:  Chest tube EXAM: PORTABLE CHEST 1 VIEW COMPARISON:  08/07/2016 FINDINGS: Left chest tube is in place. The side port remains in the chest wall. No visible pneumothorax. Moderate left pleural effusion. Probable PleurX drainage catheter projects over the left lower hemithorax. Right lung is clear. Cardiomegaly. Diffuse left lung airspace disease is unchanged. IMPRESSION: Left PleurX catheter and second left chest tube remain in  place, unchanged. The left chest tube side port is in the chest wall soft tissues. No visible pneumothorax. Continued diffuse left lung airspace disease and moderate left effusion. Electronically Signed   By: Rolm Baptise M.D.   On: 08/08/2016 07:56   Dg Chest Port 1 View  Result Date: 08/07/2016 CLINICAL DATA:  Pleural effusion.  Chest tube. EXAM: PORTABLE CHEST 1 VIEW COMPARISON:  08/06/2016. FINDINGS: Right IJ line and left chest tubes in stable position. Interim partial resolution of left-sided pneumothorax with mild residual. Heart size normal. Left lung infiltrate with left base atelectasis again noted. Left pleural effusion again noted. IMPRESSION: 1. Left chest tubes in stable position . Interim partial resolution of left sided pneumothorax with mild residual . 2. Left lung infiltrate with left base atelectasis again noted. Left-sided pleural effusion again noted. Electronically Signed   By: Marcello Moores  Register   On: 08/07/2016 07:23   Dg Chest Port 1 View  Result Date: 08/06/2016 CLINICAL DATA:  Status post VATS EXAM: PORTABLE CHEST 1 VIEW COMPARISON:  08/06/2016, earlier the same day FINDINGS: Interval placement of 2 left-sided chest tubes with decrease in left pleural effusion. Left base collapse/ consolidation persists. Small left pneumothorax is visible, new in the interval. Right IJ central line tip projects over the proximal SVC, near the innominate vein confluence. Prominence of the minor fissure noted. The cardio pericardial silhouette is enlarged. Telemetry leads overlie the chest. IMPRESSION: 1. Status post placement of 2 left-sided chest tubes with small left pneumothorax now visible. 2. Interval decrease in left pleural effusion with persistent left base collapse/consolidation. 3. Right IJ central line tip overlies the proximal SVC region near the innominate vein confluence. Electronically Signed   By: Misty Stanley M.D.   On: 08/06/2016 18:04   Dg Chest Portable 1 View  Result Date:  08/02/2016 CLINICAL DATA:  Post LEFT thoracentesis EXAM: PORTABLE CHEST 1 VIEW COMPARISON:  Portable exam 1151 hours compared to 0612 hours FINDINGS: Persistent LEFT pneumothorax. No pneumothorax post thoracentesis. Persistent atelectasis of the LEFT lung base. RIGHT lung clear. Heart size stable. Atherosclerotic calcification aorta. Multilevel endplate spur formation thoracic spine. IMPRESSION: No pneumothorax following LEFT thoracentesis. Persistent LEFT pleural effusion and basilar  atelectasis. Aortic Atherosclerosis (ICD10-I70.0). Electronically Signed   By: Lavonia Dana M.D.   On: 08/02/2016 12:19   Dg Chest Port 1 View  Result Date: 08/01/2016 CLINICAL DATA:  Post thoracentesis EXAM: PORTABLE CHEST 1 VIEW COMPARISON:  07/31/2016 FINDINGS: Erect portable view chest demonstrates clear right lung. Decreased left pleural effusion with moderate residual pleural fluid noted. No pneumothorax. Improved aeration of the left upper lobe. Dense consolidation in the lingula and left base. Enlarged cardiomediastinal silhouette IMPRESSION: 1. Decreased left pleural effusion with no pneumothorax 2. Moderate residual left pleural effusion. Dense atelectasis or pneumonia in the lingula and left base. Electronically Signed   By: Donavan Foil M.D.   On: 08/01/2016 00:53    ASSESSMENT: This is a very pleasant 76 years old white male with stage IV non-small cell lung cancer, pending the final pathology for confirmation of the histologic subtype. He presented with large left pleural effusion in addition to bilateral small pulmonary nodules. The patient is status post drainage of the left pleural effusion as well as Pleurx catheter placement.  PLAN: I had a lengthy discussion with the patient today about his current disease stage, prognosis and treatment options. I recommended for the patient to complete the staging workup by ordering a PET scan as well as MRI of the brain. I will arrange for these procedures to be done  outpatient basis after discharge. If the final pathology is consistent with non-small cell lung cancer, adenocarcinoma, I would send his tissue block to be tested for molecular studies as well as PDL 1 expression. For the recurrent left pleural effusion, the patient will continue drainage of the pleural fluid. The Pleurx catheter and under the guidance of [name]. He is expected to be discharged home today and I will arrange for the patient follow-up appointment with me at the [facility] within 2 weeks for reevaluation and discussion of his treatment options after the availability of the molecular studies as well as the staging workup. He was advised to call immediately if he has any concerning symptoms in the interval. The patient voices understanding of current disease status and treatment options and is in agreement with the current care plan. All questions were answered. The patient knows to call the clinic with any problems, questions or concerns. We can certainly see the patient much sooner if necessary.  Thank you so much for allowing me to participate in the care of Jeremy Chan. I will continue to follow up the patient with you and assist in his care.  Disclaimer: This note was dictated with voice recognition software. Similar sounding words can inadvertently be transcribed and may not be corrected upon review.   Jonisha Kindig K. August 10, 2016, 9:02 AM

## 2016-08-10 NOTE — Progress Notes (Signed)
Daily PleurX drainage complete. Output: 13mL

## 2016-08-10 NOTE — Discharge Summary (Signed)
Physician Discharge Summary  Jeremy Chan FGH:[id number] DOB: Aug 26, 1940 DOA: 07/31/2016  PCP: Haze Rushing, MD  Admit date: 07/31/2016 Discharge date: 08/10/2016  Admitted From: Home Disposition:  Home  Recommendations for Outpatient Follow-up:  1. Follow up with PCP in 1 week 2. Please obtain BMP/CBC in one week 3. Please follow with oncology/Dr. Julien Nordmann in 1-2 weeks 4. Please follow up with [name] as scheduled. Pleurx catheter care will be as per Dr. Everrett Coombe recommendations  Home Health: Yes Equipment/Devices: Pleurx catheter  Discharge Condition: Guarded  CODE STATUS: Full  Diet recommendation: Heart Healthy   Brief/Interim Summary: 76y old male with history of obstructive sleep apnea, prostate cancer, hypertension, hyperlipidemia, coronary artery disease presented to the ED with progressive worsening shortness of breath and occasional chest pain for the past few weeks. Patient noted to have a large left-sided pleural effusion. Pulmonary/critical care consulted and patient underwent a thoracentesis. Cytology from first thoracentesis showedatypical cells. Repeat thoracentesis cytology showedatypical cells concerning for carcinoma. TCTS consulted and patient had VATS, talc pleurodesis and pleural biopsy byDr. Servando Snare on 08/06/2016. Pathology pending. Patient has been evaluated by Dr. Julien Nordmann from oncology who plans for outpatient follow-up.  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 Cytology  showed atypical cells  - patient underwent another thoracentesis with 600 mL of bloody pleural fluid obtained and sent for cytology/pathology (08/02/2016) which showed rare atypical cells concerning for carcinoma.Repeat chest x-ray postthoracentesis negative for pneumothorax.  - Due to consolidation noted on CT chest and leukocytosis, patient started empirically on IV antibiotics. Cultures negative. Discontinued antibiotics on 08/08/2016 -s/p VATs, talc pleurodesis, andpleural biopsy byDr. Servando Snare 08/06/2016 with pathology pending. Status post chest tube removal on 08/08/2016 by CT surgery. Patient has Pleurx catheter in place. Patient will be discharged with Pleurx catheter and outpatient follow-up with [name] as scheduled. Pleurx catheter care will be as per his recommendations. - Dr. Julien Nordmann from oncology has seen the patient today, official consultation is pending. He is okay for the patient to be discharged home with outpatient follow-up with him in 78-2 weeks.  probable community acquired pneumonia - Status post Rocephin and Zithromax. Cultures negative. Monitor off antibiotics  left pneumothorax Noted on chest x-ray postoperatively. status with chest tube removal; currently has Pleurx catheter.  hypertension Stable. Continue currenthome regimen of Norvasc, benazepril.  hyperlipidemia Continue statin.  Acute blood loss anemia Hemoglobin stable  Constipation Continue stool softeners and laxatives  Discharge Instructions  Discharge Instructions    Call MD for:  difficulty breathing, headache or visual disturbances    Complete by:  As directed    Call MD for:  extreme fatigue    Complete by:  As directed    Call MD for:  hives    Complete by:  As directed    Call MD for:  persistant dizziness or light-headedness    Complete by:  As directed    Call MD for:  persistant nausea and vomiting    Complete by:  As directed    Call MD for:  severe uncontrolled pain  Complete by:  As directed    Call MD for:  temperature >100.4    Complete by:  As directed    Diet - low sodium heart healthy    Complete by:  As directed    Discharge instructions    Complete by:  As directed    Pleurx catheter care as per Dr. Everrett Coombe recommendations   Increase activity slowly    Complete by:  As directed      Allergies as of 08/10/2016   No Known Allergies     Medication List    TAKE these medications   amLODipine-benazepril 5-20 MG capsule Commonly known as:  LOTREL Take 1 capsule by mouth daily.   aspirin 81 MG chewable tablet Chew 81 mg by mouth daily.   atorvastatin 40 MG tablet Commonly known as:  LIPITOR Take 40 mg by mouth daily.   bisacodyl 10 MG suppository Commonly known as:  DULCOLAX Place 1 suppository (10 mg total) rectally daily as needed for moderate constipation.   HYDROcodone-acetaminophen 5-325 MG tablet Commonly known as:  NORCO/VICODIN Take 1 tablet by mouth every 6 (six) hours as needed for moderate pain or severe pain.   ibuprofen 200 MG tablet Commonly known as:  ADVIL,MOTRIN Take 800 mg by mouth every 6 (six) hours as needed.   ondansetron 4 MG tablet Commonly known as:  ZOFRAN Take 1 tablet (4 mg total) by mouth every 6 (six) hours as needed for nausea.   polyethylene glycol packet Commonly known as:  MIRALAX / GLYCOLAX Take 17 g by mouth daily.   senna-docusate 8.6-50 MG tablet Commonly known as:  Senokot-S Take 1 tablet by mouth 2 (two) times daily.      Follow-up Information    [name], MD Follow up on 08/27/2016.   Specialty:  Cardiothoracic Surgery Why:  PA/LAT CXR (to be taken at [facility] which is in the same building as Dr. Everrett Coombe office) on 08/27/2016 at 12:30 pm;Appointment time is at 1:00 pm Contact information: [address] [place] [address] [phone]        Haze Rushing, MD Follow up in 1 week(s).   Specialty:  Internal Medicine Contact  information: [place] [address] [phone]        Curt Bears, MD Follow up in 2 week(s).   Specialty:  Oncology Contact information: [place] [address] [phone]          No Known Allergies  Consultations:  CT surgery, PCCM, oncology   Procedures/Studies: Dg Chest 2 View  Result Date: 08/09/2016 CLINICAL DATA:  Follow-up left pneumothorax. History of bronchoscopy, pleural biopsy, and talc pleurodesis on August 06, 2016 EXAM: CHEST  2 VIEW COMPARISON:  Portable chest x-ray of August 08, 2016. FINDINGS: The right lung is well-expanded and clear. On the left there is persistent volume loss. No definite pneumothorax is observed today. There is pleural fluid and/or pleural thickening along the lateral pleural surface and at the left lung base. The small caliber chest tube is coiled just above the left lateral costophrenic gutter. The large chest tube is been removed. The cardiac silhouette is partially obscured. The pulmonary vascularity is not clearly engorged. The right internal jugular venous catheter tip projects over the proximal SVC. IMPRESSION: Slight interval improvement in the aeration of both lungs. Considerable volume loss on the left is present with pleural thickening and/or pleural effusion laterally and at the lung base. No definite pneumothorax is observed though a  tiny left apical pneumothorax cannot be absolutely excluded. The small caliber chest tube is coiled at over the lateral costophrenic angle region. The larger chest tube has been removed. Electronically Signed   By: David  Martinique M.D.   On: 08/09/2016 07:33   Dg Chest 2 View  Result Date: 08/06/2016 CLINICAL DATA:  76 year old male with pleural effusion. Preoperative exam. Subsequent encounter. EXAM: CHEST  2 VIEW COMPARISON:  08/02/2016 CT and chest x-ray. FINDINGS: Moderate size left-sided pleural effusion with left lower lobe and lingular consolidation. CT detected  pulmonary nodules not well demonstrated by plain film exam. No pneumothorax. Heart size within normal limits.  Calcified aorta. Ankylosis portion of thoracic spine with prominent osteophyte. IMPRESSION: Moderate size left-sided pleural effusion with left lower lobe and lingular consolidation. CT detected pulmonary nodules not well demonstrated by plain film exam. Electronically Signed   By: Genia Del M.D.   On: 08/06/2016 07:37   Dg Chest 2 View  Result Date: 08/02/2016 CLINICAL DATA:  Follow-up pleural effusion EXAM: CHEST  2 VIEW COMPARISON:  Chest radiograph from one day prior. FINDINGS: Stable cardiomediastinal silhouette with normal heart size. No pneumothorax. Moderate to large left pleural effusion, slightly decreased. No right pleural effusion. Stable left lung base consolidation. No new focal consolidative airspace disease. IMPRESSION: 1. Moderate to large left pleural effusion, slightly decreased . 2. Stable left lung base consolidation, favor atelectasis, recommend follow-up to resolution. Electronically Signed   By: Ilona Sorrel M.D.   On: 08/02/2016 07:04   Dg Chest 2 View  Result Date: 07/31/2016 CLINICAL DATA:  76 year old with 2-3 day history of chest pain, shortness of breath and cough. EXAM: CHEST  2 VIEW COMPARISON:  05/30/2010. FINDINGS: Very large left pleural effusion with residual aeration of only the left lung apex. The large effusion obscures the cardiac silhouette. Right lung clear. No right pleural effusion. Degenerative changes and DISH involving the thoracic spine. IMPRESSION: Very large left pleural effusion and associated passive atelectasis involving the left lung with only the left apex having residual aeration. Right lung clear. Electronically Signed   By: Evangeline Dakin M.D.   On: 07/31/2016 18:49   Ct Chest Wo Contrast  Result Date: 08/02/2016 CLINICAL DATA:  Shortness of breath, known left pleural effusion EXAM: CT CHEST WITHOUT CONTRAST TECHNIQUE: Multidetector  CT imaging of the chest was performed following the standard protocol without IV contrast. COMPARISON:  08/02/2016 FINDINGS: Cardiovascular: Somewhat limited due to the lack of IV contrast. Aortic calcifications are noted. No aneurysmal dilatation is noted. Coronary calcifications are seen. No significant cardiac enlargement is noted. Mediastinum/Nodes: The esophagus is within normal limits. Scattered small mediastinal and hilar lymph nodes are seen. None of these are significant by size criteria. Lungs/Pleura: Moderate left pleural effusion remains similar to that seen on prior plain film examination. Considerable consolidation within the lower lobe is seen. Few small subpleural nodules are noted measuring less than 5 mm. Some patchy changes are noted in the left upper lobe which may be related to some re-expansion edema. A pleural-based nodule measuring 11 mm is noted on the left on image number 37 of series 8. The right lung shows some subpleural nodules. The largest of these measures 11 mm in the right lower lobe on image number 95 of series 8. Upper Abdomen: No acute abnormality is noted in the upper abdomen. Musculoskeletal: Degenerative changes of the thoracic spine are seen. IMPRESSION: Persistent moderate left pleural effusion with left lower lobe consolidation. Some mild re-expansion edema is noted in  the right upper lobe. No pneumothorax is noted. Multiple bilateral lung nodules. The largest of these measures 11 mm bilaterally. Non-contrast chest CT at 3-6 months is recommended. If the nodules are stable at time of repeat CT, then future CT at 18-24 months (from today's scan) is considered optional for low-risk patients, but is recommended for high-risk patients. This recommendation follows the consensus statement: Guidelines for Management of Incidental Pulmonary Nodules Detected on CT Images: From the Fleischner Society 2017; Radiology 2017; 284:228-243. These results will be called to the ordering  clinician or representative by the Radiologist Assistant, and communication documented in the PACS or zVision Dashboard. Aortic Atherosclerosis (ICD10-I70.0). Electronically Signed   By: Inez Catalina M.D.   On: 08/02/2016 16:05   Dg Chest Right Decubitus  Result Date: 07/31/2016 CLINICAL DATA:  Shortness of breath EXAM: CHEST - RIGHT DECUBITUS COMPARISON:  07/31/2016 FINDINGS: Right side down decubitus view. Large left pleural effusion with small amount of aerated lung at the left apex, fluid appears mobile given change in location of the aerated lung. Unable to comment on cardiomediastinal silhouette as it is obscured. Fluid level in the left upper quadrant is probably in the stomach IMPRESSION: Large left pleural effusion with minimal aeration of the left lung apex. Electronically Signed   By: Donavan Foil M.D.   On: 07/31/2016 21:54   Dg Chest Port 1 View  Result Date: 08/08/2016 CLINICAL DATA:  Chest tube EXAM: PORTABLE CHEST 1 VIEW COMPARISON:  08/07/2016 FINDINGS: Left chest tube is in place. The side port remains in the chest wall. No visible pneumothorax. Moderate left pleural effusion. Probable PleurX drainage catheter projects over the left lower hemithorax. Right lung is clear. Cardiomegaly. Diffuse left lung airspace disease is unchanged. IMPRESSION: Left PleurX catheter and second left chest tube remain in place, unchanged. The left chest tube side port is in the chest wall soft tissues. No visible pneumothorax. Continued diffuse left lung airspace disease and moderate left effusion. Electronically Signed   By: Rolm Baptise M.D.   On: 08/08/2016 07:56   Dg Chest Port 1 View  Result Date: 08/07/2016 CLINICAL DATA:  Pleural effusion.  Chest tube. EXAM: PORTABLE CHEST 1 VIEW COMPARISON:  08/06/2016. FINDINGS: Right IJ line and left chest tubes in stable position. Interim partial resolution of left-sided pneumothorax with mild residual. Heart size normal. Left lung infiltrate with left base  atelectasis again noted. Left pleural effusion again noted. IMPRESSION: 1. Left chest tubes in stable position . Interim partial resolution of left sided pneumothorax with mild residual . 2. Left lung infiltrate with left base atelectasis again noted. Left-sided pleural effusion again noted. Electronically Signed   By: Marcello Moores  Register   On: 08/07/2016 07:23   Dg Chest Port 1 View  Result Date: 08/06/2016 CLINICAL DATA:  Status post VATS EXAM: PORTABLE CHEST 1 VIEW COMPARISON:  08/06/2016, earlier the same day FINDINGS: Interval placement of 2 left-sided chest tubes with decrease in left pleural effusion. Left base collapse/ consolidation persists. Small left pneumothorax is visible, new in the interval. Right IJ central line tip projects over the proximal SVC, near the innominate vein confluence. Prominence of the minor fissure noted. The cardio pericardial silhouette is enlarged. Telemetry leads overlie the chest. IMPRESSION: 1. Status post placement of 2 left-sided chest tubes with small left pneumothorax now visible. 2. Interval decrease in left pleural effusion with persistent left base collapse/consolidation. 3. Right IJ central line tip overlies the proximal SVC region near the innominate vein confluence. Electronically Signed  By: Misty Stanley M.D.   On: 08/06/2016 18:04   Dg Chest Portable 1 View  Result Date: 08/02/2016 CLINICAL DATA:  Post LEFT thoracentesis EXAM: PORTABLE CHEST 1 VIEW COMPARISON:  Portable exam 1151 hours compared to 0612 hours FINDINGS: Persistent LEFT pneumothorax. No pneumothorax post thoracentesis. Persistent atelectasis of the LEFT lung base. RIGHT lung clear. Heart size stable. Atherosclerotic calcification aorta. Multilevel endplate spur formation thoracic spine. IMPRESSION: No pneumothorax following LEFT thoracentesis. Persistent LEFT pleural effusion and basilar atelectasis. Aortic Atherosclerosis (ICD10-I70.0). Electronically Signed   By: Lavonia Dana M.D.   On:  08/02/2016 12:19   Dg Chest Port 1 View  Result Date: 08/01/2016 CLINICAL DATA:  Post thoracentesis EXAM: PORTABLE CHEST 1 VIEW COMPARISON:  07/31/2016 FINDINGS: Erect portable view chest demonstrates clear right lung. Decreased left pleural effusion with moderate residual pleural fluid noted. No pneumothorax. Improved aeration of the left upper lobe. Dense consolidation in the lingula and left base. Enlarged cardiomediastinal silhouette IMPRESSION: 1. Decreased left pleural effusion with no pneumothorax 2. Moderate residual left pleural effusion. Dense atelectasis or pneumonia in the lingula and left base. Electronically Signed   By: Donavan Foil M.D.   On: 08/01/2016 00:53    - Diagnostic and therapeutic thoracentesis per Dr. Jimmey Ralph 08/01/2016  - Diagnostic and therapeutic thoracentesis per Dr. Elsworth Soho 08/02/2016  - Left-sided VATS with talc pleurodesis, pleural biopsy, Pleurx catheter placement on 08/06/16    Subjective: Patient seen and examined at bedside. He denies any overnight fever, cough, shortness of breath. No abdominal pain or nausea. He feels constipated.  Discharge Exam: Vitals:   08/10/16 0400 08/10/16 0717  BP: 132/66 118/62  Pulse: 91 81  Resp: 18 14  Temp: 98 F (36.7 C) 97.9 F (36.6 C)   Vitals:   08/09/16 1934 08/09/16 2257 08/10/16 0400 08/10/16 0717  BP: 131/69 134/76 132/66 118/62  Pulse: 86 87 91 81  Resp: 19 (!) 23 18 14   Temp: 98.4 F (36.9 C) 98.4 F (36.9 C) 98 F (36.7 C) 97.9 F (36.6 C)  TempSrc: Oral Oral Oral Oral  SpO2: 96% 96% 94% 95%  Weight:      Height:        General: Pt is alert, awake, not in acute distress Cardiovascular: Rate controlled, S1/S2 + Respiratory: Bilateral decreased breath sounds at bases with some scattered crackles Abdominal: Soft, NT, ND, bowel sounds + Extremities: Trace pitting pedal edema; no cyanosis    The results of significant diagnostics from this hospitalization (including imaging, microbiology,  ancillary and laboratory) are listed below for reference.     Microbiology: Recent Results (from the past 240 hour(s))  Fungus Culture With Stain     Status: None (Preliminary result)   Collection Time: 07/31/16 11:59 PM  Result Value Ref Range Status   Fungus Stain Final report  Final    Comment: (NOTE) Performed At: [facility] [place], [place] [id number] Lindon Romp MD KW:[id number]    Fungus (Mycology) Culture PENDING  Incomplete   Fungal Source PLEURAL  Final  Fungus Culture Result     Status: None   Collection Time: 07/31/16 11:59 PM  Result Value Ref Range Status   Result 1 Comment  Final    Comment: (NOTE) KOH/Calcofluor preparation:  no fungus observed. Performed At: [facility] [place], [place] [id number] Lindon Romp MD QQ:[id number]   Body fluid culture (includes gram stain)     Status: None   Collection Time: 08/01/16 12:13 AM  Result Value Ref Range Status   Specimen Description PLEURAL FLUID  Final   Special Requests Normal  Final   Gram Stain   Final    RARE WBC PRESENT,BOTH PMN AND MONONUCLEAR NO ORGANISMS SEEN    Culture NO GROWTH 3 DAYS  Final   Report Status 08/04/2016 FINAL  Final  Culture, sputum-assessment     Status: None   Collection Time: 08/03/16 10:40 AM  Result Value Ref Range Status   Specimen Description EXPECTORATED SPUTUM  Final   Special Requests NONE  Final   Sputum evaluation THIS SPECIMEN IS ACCEPTABLE FOR SPUTUM CULTURE  Final   Report Status 08/04/2016 FINAL  Final  Culture, respiratory (NON-Expectorated)     Status: None   Collection Time: 08/03/16 10:40 AM  Result Value Ref Range Status   Specimen Description EXPECTORATED SPUTUM  Final   Special Requests NONE Reflexed from [id number]  Final   Gram Stain   Final    ABUNDANT WBC PRESENT,BOTH PMN AND MONONUCLEAR FEW GRAM POSITIVE COCCI IN PAIRS FEW GRAM NEGATIVE RODS RARE GRAM POSITIVE RODS    Culture Consistent with normal  respiratory flora.  Final   Report Status 08/06/2016 FINAL  Final  Culture, blood (routine x 2) Call MD if unable to obtain prior to antibiotics being given     Status: None   Collection Time: 08/03/16 11:41 AM  Result Value Ref Range Status   Specimen Description BLOOD LEFT HAND  Final   Special Requests IN PEDIATRIC BOTTLE Blood Culture adequate volume  Final   Culture NO GROWTH 5 DAYS  Final   Report Status 08/08/2016 FINAL  Final  Culture, blood (routine x 2) Call MD if unable to obtain prior to antibiotics being given     Status: None   Collection Time: 08/03/16 11:41 AM  Result Value Ref Range Status   Specimen Description BLOOD RIGHT ANTECUBITAL  Final   Special Requests IN PEDIATRIC BOTTLE Blood Culture adequate volume  Final   Culture NO GROWTH 5 DAYS  Final   Report Status 08/08/2016 FINAL  Final  Surgical pcr screen     Status: None   Collection Time: 08/05/16  9:27 PM  Result Value Ref Range Status   MRSA, PCR NEGATIVE NEGATIVE Final   Staphylococcus aureus NEGATIVE NEGATIVE Final    Comment:        The Xpert SA Assay (FDA approved for NASAL specimens in patients over 6 years of age), is one component of a comprehensive surveillance program.  Test performance has been validated by [facility] for patients greater than or equal to 57 year old. It is not intended to diagnose infection nor to guide or monitor treatment.   Fungus Culture With Stain     Status: None (Preliminary result)   Collection Time: 08/06/16  4:33 PM  Result Value Ref Range Status   Fungus Stain Final report  Final    Comment: (NOTE) Performed At: [facility] [place], [place] [id number] Lindon Romp MD [id number]    Fungus (Mycology) Culture PENDING  Incomplete   Fungal Source PLEURAL  Final    Comment: LEFT  Aerobic/Anaerobic Culture (surgical/deep wound)     Status: None (Preliminary result)   Collection Time: 08/06/16  4:33 PM  Result Value Ref Range  Status   Specimen Description PLEURAL LEFT  Final   Special Requests PT ON ZINACEF  Final   Gram Stain NO WBC SEEN NO ORGANISMS SEEN   Final   Culture  Final    NO GROWTH 3 DAYS NO ANAEROBES ISOLATED; CULTURE IN PROGRESS FOR 5 DAYS   Report Status PENDING  Incomplete  Acid Fast Smear (AFB)     Status: None   Collection Time: 08/06/16  4:33 PM  Result Value Ref Range Status   AFB Specimen Processing Concentration  Final   Acid Fast Smear Negative  Final    Comment: (NOTE) Performed At: [facility] [place], [place] [id number] Lindon Romp MD [id number]    Source (AFB) PLEURAL  Final    Comment: LEFT  Fungus Culture Result     Status: None   Collection Time: 08/06/16  4:33 PM  Result Value Ref Range Status   Result 1 Comment  Final    Comment: (NOTE) KOH/Calcofluor preparation:  no fungus observed. Performed At: [facility] [place], [place] [id number] Lindon Romp MD NO:[id number]      Labs: BNP (last 3 results) No results for input(s): BNP in the last 8760 hours. Basic Metabolic Panel:  Recent Labs Lab 08/06/16 0323 08/07/16 0530 08/08/16 0545 08/09/16 0530 08/10/16 0430  NA 133* 133* 134* 134* 132*  K 4.3 4.2 4.1 4.3 4.1  CL 101 98* 99* 98* 98*  CO2 25 27 27 28 26   GLUCOSE 115* 104* 102* 98 101*  BUN 10 8 8 9 11   CREATININE 0.67 0.66 0.62 0.66 0.68  CALCIUM 8.3* 8.2* 8.2* 8.3* 8.3*  MG  --   --   --  2.0 2.1   Liver Function Tests:  Recent Labs Lab 08/04/16 0446 08/08/16 0545  AST 12* 25  ALT 10* 23  ALKPHOS 57 103  BILITOT 0.7 0.9  PROT 5.6* 5.5*  ALBUMIN 2.5* 2.1*   No results for input(s): LIPASE, AMYLASE in the last 168 hours. No results for input(s): AMMONIA in the last 168 hours. CBC:  Recent Labs Lab 08/06/16 0323 08/07/16 0340 08/08/16 0545 08/09/16 0530 08/10/16 0430  WBC 12.6* 12.2* 15.0* 14.6* 12.8*  NEUTROABS  --  9.2*  --  11.1* 10.1*  HGB 12.9* 11.0* 12.5* 12.1*  12.0*  HCT 39.2 33.8* 38.6* 37.5* 35.8*  MCV 92.2 92.6 93.5 92.8 92.3  PLT 270 232 279 298 302   Cardiac Enzymes: No results for input(s): CKTOTAL, CKMB, CKMBINDEX, TROPONINI in the last 168 hours. BNP: Invalid input(s): POCBNP CBG: No results for input(s): GLUCAP in the last 168 hours. D-Dimer No results for input(s): DDIMER in the last 72 hours. Hgb A1c No results for input(s): HGBA1C in the last 72 hours. Lipid Profile No results for input(s): CHOL, HDL, LDLCALC, TRIG, CHOLHDL, LDLDIRECT in the last 72 hours. Thyroid function studies No results for input(s): TSH, T4TOTAL, T3FREE, THYROIDAB in the last 72 hours.  Invalid input(s): FREET3 Anemia work up No results for input(s): VITAMINB12, FOLATE, FERRITIN, TIBC, IRON, RETICCTPCT in the last 72 hours. Urinalysis    Component Value Date/Time   COLORURINE YELLOW 08/04/2016 0412   APPEARANCEUR CLEAR 08/04/2016 0412   LABSPEC 1.009 08/04/2016 0412   PHURINE 6.0 08/04/2016 0412   GLUCOSEU NEGATIVE 08/04/2016 0412   HGBUR NEGATIVE 08/04/2016 0412   BILIRUBINUR NEGATIVE 08/04/2016 0412   KETONESUR NEGATIVE 08/04/2016 0412   PROTEINUR NEGATIVE 08/04/2016 0412   UROBILINOGEN 1.0 05/30/2010 1130   NITRITE NEGATIVE 08/04/2016 0412   LEUKOCYTESUR NEGATIVE 08/04/2016 0412   Sepsis Labs Invalid input(s): PROCALCITONIN,  WBC,  LACTICIDVEN Microbiology Recent Results (from the past 240 hour(s))  Fungus Culture With Stain  Status: None (Preliminary result)   Collection Time: 07/31/16 11:59 PM  Result Value Ref Range Status   Fungus Stain Final report  Final    Comment: (NOTE) Performed At: [facility] [place], [place] [id number] Lindon Romp MD [id number]    Fungus (Mycology) Culture PENDING  Incomplete   Fungal Source PLEURAL  Final  Fungus Culture Result     Status: None   Collection Time: 07/31/16 11:59 PM  Result Value Ref Range Status   Result 1 Comment  Final    Comment:  (NOTE) KOH/Calcofluor preparation:  no fungus observed. Performed At: [facility] [place], [place] [id number] Lindon Romp MD [id number]   Body fluid culture (includes gram stain)     Status: None   Collection Time: 08/01/16 12:13 AM  Result Value Ref Range Status   Specimen Description PLEURAL FLUID  Final   Special Requests Normal  Final   Gram Stain   Final    RARE WBC PRESENT,BOTH PMN AND MONONUCLEAR NO ORGANISMS SEEN    Culture NO GROWTH 3 DAYS  Final   Report Status 08/04/2016 FINAL  Final  Culture, sputum-assessment     Status: None   Collection Time: 08/03/16 10:40 AM  Result Value Ref Range Status   Specimen Description EXPECTORATED SPUTUM  Final   Special Requests NONE  Final   Sputum evaluation THIS SPECIMEN IS ACCEPTABLE FOR SPUTUM CULTURE  Final   Report Status 08/04/2016 FINAL  Final  Culture, respiratory (NON-Expectorated)     Status: None   Collection Time: 08/03/16 10:40 AM  Result Value Ref Range Status   Specimen Description EXPECTORATED SPUTUM  Final   Special Requests NONE Reflexed from [id number]  Final   Gram Stain   Final    ABUNDANT WBC PRESENT,BOTH PMN AND MONONUCLEAR FEW GRAM POSITIVE COCCI IN PAIRS FEW GRAM NEGATIVE RODS RARE GRAM POSITIVE RODS    Culture Consistent with normal respiratory flora.  Final   Report Status 08/06/2016 FINAL  Final  Culture, blood (routine x 2) Call MD if unable to obtain prior to antibiotics being given     Status: None   Collection Time: 08/03/16 11:41 AM  Result Value Ref Range Status   Specimen Description BLOOD LEFT HAND  Final   Special Requests IN PEDIATRIC BOTTLE Blood Culture adequate volume  Final   Culture NO GROWTH 5 DAYS  Final   Report Status 08/08/2016 FINAL  Final  Culture, blood (routine x 2) Call MD if unable to obtain prior to antibiotics being given     Status: None   Collection Time: 08/03/16 11:41 AM  Result Value Ref Range Status   Specimen Description BLOOD RIGHT  ANTECUBITAL  Final   Special Requests IN PEDIATRIC BOTTLE Blood Culture adequate volume  Final   Culture NO GROWTH 5 DAYS  Final   Report Status 08/08/2016 FINAL  Final  Surgical pcr screen     Status: None   Collection Time: 08/05/16  9:27 PM  Result Value Ref Range Status   MRSA, PCR NEGATIVE NEGATIVE Final   Staphylococcus aureus NEGATIVE NEGATIVE Final    Comment:        The Xpert SA Assay (FDA approved for NASAL specimens in patients over 20 years of age), is one component of a comprehensive surveillance program.  Test performance has been validated by [facility] for patients greater than or equal to 78 year old. It is not intended to diagnose infection nor to guide  or monitor treatment.   Fungus Culture With Stain     Status: None (Preliminary result)   Collection Time: 08/06/16  4:33 PM  Result Value Ref Range Status   Fungus Stain Final report  Final    Comment: (NOTE) Performed At: [facility] [place], [place] [id number] Lindon Romp MD [id number]    Fungus (Mycology) Culture PENDING  Incomplete   Fungal Source PLEURAL  Final    Comment: LEFT  Aerobic/Anaerobic Culture (surgical/deep wound)     Status: None (Preliminary result)   Collection Time: 08/06/16  4:33 PM  Result Value Ref Range Status   Specimen Description PLEURAL LEFT  Final   Special Requests PT ON ZINACEF  Final   Gram Stain NO WBC SEEN NO ORGANISMS SEEN   Final   Culture   Final    NO GROWTH 3 DAYS NO ANAEROBES ISOLATED; CULTURE IN PROGRESS FOR 5 DAYS   Report Status PENDING  Incomplete  Acid Fast Smear (AFB)     Status: None   Collection Time: 08/06/16  4:33 PM  Result Value Ref Range Status   AFB Specimen Processing Concentration  Final   Acid Fast Smear Negative  Final    Comment: (NOTE) Performed At: [facility] [place], [place] [id number] Lindon Romp MD [id number]    Source (AFB) PLEURAL  Final    Comment: LEFT  Fungus  Culture Result     Status: None   Collection Time: 08/06/16  4:33 PM  Result Value Ref Range Status   Result 1 Comment  Final    Comment: (NOTE) KOH/Calcofluor preparation:  no fungus observed. Performed At: [facility] [place], [place] [id number] Lindon Romp MD [id number]      Time coordinating discharge: 35 minutes  SIGNED:   Aline August, MD  [facility] 08/10/2016, 9:12 AM Pager: [phone]  If 7PM-7AM, please contact night-coverage [email or web address] Password [email or web address]

## 2016-08-11 DIAGNOSIS — J90 Pleural effusion, not elsewhere classified: Secondary | ICD-10-CM

## 2016-08-11 LAB — AEROBIC/ANAEROBIC CULTURE (SURGICAL/DEEP WOUND): GRAM STAIN: NONE SEEN

## 2016-08-11 LAB — AEROBIC/ANAEROBIC CULTURE W GRAM STAIN (SURGICAL/DEEP WOUND): Culture: NO GROWTH

## 2016-08-13 ENCOUNTER — Encounter: Payer: Self-pay | Admitting: Internal Medicine

## 2016-08-13 ENCOUNTER — Other Ambulatory Visit: Payer: Self-pay | Admitting: Internal Medicine

## 2016-08-13 ENCOUNTER — Telehealth: Payer: Self-pay | Admitting: *Deleted

## 2016-08-13 DIAGNOSIS — C3492 Malignant neoplasm of unspecified part of left bronchus or lung: Secondary | ICD-10-CM

## 2016-08-13 HISTORY — DX: Malignant neoplasm of unspecified part of left bronchus or lung: C34.92

## 2016-08-13 NOTE — Telephone Encounter (Signed)
Done

## 2016-08-13 NOTE — Telephone Encounter (Signed)
Call from daughter "[name] calling to schedule the two week hospital F/U for my Dad and ensure PET and MRI brain has been ordered to be performed hopefully this week.  Neither of my parents drive. I can get them there this week.  He was discharged Friday, June 6th.  Dr.Mohamed saw him as a hospital consult."  Message left for navigator.  Per [name], "Dad's home number is [phone] or his mobile number is [phone]."

## 2016-08-14 ENCOUNTER — Encounter: Payer: Self-pay | Admitting: *Deleted

## 2016-08-14 NOTE — Progress Notes (Signed)
Oncology Nurse Navigator Documentation  Oncology Nurse Navigator Flowsheets 08/14/2016  Navigator Location [facility]  Referral date to RadOnc/MedOnc 08/14/2016  Navigator Encounter Type Other/I contacted authorization coordinator to help with scans authorization  Treatment Phase Pre-Tx/Tx Discussion  Barriers/Navigation Needs Coordination of Care  Interventions Coordination of Care  Coordination of Care Other  Acuity Level 1  Time Spent with Patient 15

## 2016-08-14 NOTE — Progress Notes (Signed)
Oncology Nurse Navigator Documentation  Oncology Nurse Navigator Flowsheets 08/14/2016  Navigator Location [facility]  Navigator Encounter Type Other/per Dr. Julien Nordmann, I contacted pathology dept to send tissue obtained on 08/06/16 for foundation one and PDL 1 testing.   Treatment Phase Pre-Tx/Tx Discussion  Barriers/Navigation Needs Coordination of Care  Interventions Coordination of Care  Coordination of Care Appts  Acuity Level 2  Acuity Level 2 Other  Time Spent with Patient 30

## 2016-08-15 ENCOUNTER — Telehealth: Payer: Self-pay | Admitting: Internal Medicine

## 2016-08-15 NOTE — Telephone Encounter (Signed)
Reminder letter sent in the mail - scheduled appt per sch message 7/9 - scheudled appt after PET and MRI appt on 8/1 f/u with lab week after.

## 2016-08-21 ENCOUNTER — Telehealth: Payer: Self-pay | Admitting: Medical Oncology

## 2016-08-21 ENCOUNTER — Other Ambulatory Visit: Payer: Self-pay | Admitting: Cardiothoracic Surgery

## 2016-08-21 DIAGNOSIS — J939 Pneumothorax, unspecified: Secondary | ICD-10-CM

## 2016-08-21 NOTE — Telephone Encounter (Signed)
Wife called to report pt is not eating or sleeping well. Asking for suggestions for food. I gave her examples of high protein foods. I spoke to pt and he said his "appetite is coming along , I am eating more protein" . Home health is coming out to drain pleural fluid but there is little drainage. He sees Radio broadcast assistant and will ask about sleepign.

## 2016-08-23 ENCOUNTER — Ambulatory Visit
Admission: RE | Admit: 2016-08-23 | Discharge: 2016-08-23 | Disposition: A | Discharge status: Home / Self Care | Payer: Medicare Other | Source: Ambulatory Visit | Attending: Cardiothoracic Surgery | Admitting: Cardiothoracic Surgery

## 2016-08-23 ENCOUNTER — Ambulatory Visit (INDEPENDENT_AMBULATORY_CARE_PROVIDER_SITE_OTHER): Payer: Self-pay | Admitting: Cardiothoracic Surgery

## 2016-08-23 ENCOUNTER — Encounter: Payer: Self-pay | Admitting: Cardiothoracic Surgery

## 2016-08-23 ENCOUNTER — Other Ambulatory Visit: Payer: Self-pay

## 2016-08-23 VITALS — BP 101/66 | HR 100 | Resp 16 | Ht 69.5 in | Wt 200.6 lb

## 2016-08-23 DIAGNOSIS — J939 Pneumothorax, unspecified: Secondary | ICD-10-CM

## 2016-08-23 DIAGNOSIS — J90 Pleural effusion, not elsewhere classified: Secondary | ICD-10-CM

## 2016-08-23 DIAGNOSIS — Z9689 Presence of other specified functional implants: Secondary | ICD-10-CM

## 2016-08-23 DIAGNOSIS — Z09 Encounter for follow-up examination after completed treatment for conditions other than malignant neoplasm: Secondary | ICD-10-CM

## 2016-08-23 NOTE — Progress Notes (Signed)
[address]       [place] [address]             [place] Record #[id number] Date of Birth: [birth year]  Referring: Rigoberto Noel, MD Primary Care: Haze Rushing, MD  Chief Complaint:   POST OP FOLLOW UP 08/06/2016 OPERATIVE REPORT PREOPERATIVE DIAGNOSIS:  Recurrent left pleural effusion. POSTOPERATIVE DIAGNOSIS:  Left malignant pleural effusion.  Final path pending. PROCEDURE PERFORMED:  Bronchoscopy, left video-assisted thoracoscopy, drainage of pleural effusion, pleural biopsies, multiple, placement of Pleurx catheter.  History of Present Illness:     Patient returns to office today in after recent left VATS drainage of pleural effusion pleural biopsies. His Pleurx catheters draining minimal fluid. He remains weak but overall his rest for status is improved.     Past Medical History:  Diagnosis Date  . 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 Age-related bone loss   . Erectile dysfunction   . GERD (gastroesophageal reflux disease)   . Hyperkalemia   . Hyperlipidemia   . Hypertension   . Other dietary vitamin B12 deficiency anemia   . Prostate cancer ([place])   . Urinary tract infection   . Vitamin D deficiency      History  Smoking Status  . Former Smoker  . Types: Cigarettes  . Quit date: 1986  Smokeless Tobacco  . Never Used    History  Alcohol Use  . Yes     No Known Allergies  Current Outpatient Prescriptions  Medication Sig Dispense Refill  . amLODipine-benazepril (LOTREL) 5-20 MG capsule Take 1 capsule by mouth daily.    Marland Kitchen aspirin 81 MG chewable tablet Chew 81 mg by mouth daily.    Marland Kitchen atorvastatin (LIPITOR) 40 MG tablet Take 40 mg by mouth daily.    . bisacodyl (DULCOLAX) 10 MG suppository Place 1 suppository (10 mg total) rectally daily as needed for moderate constipation. 12 suppository 0  . ibuprofen (ADVIL,MOTRIN) 200 MG tablet Take 800 mg by mouth every 6 (six)  hours as needed.    . ondansetron (ZOFRAN) 4 MG tablet Take 1 tablet (4 mg total) by mouth every 6 (six) hours as needed for nausea. 20 tablet 0  . polyethylene glycol (MIRALAX / GLYCOLAX) packet Take 17 g by mouth daily. 14 each 0  . senna-docusate (SENOKOT-S) 8.6-50 MG tablet Take 1 tablet by mouth 2 (two) times daily. 30 tablet 0  . HYDROcodone-acetaminophen (NORCO/VICODIN) 5-325 MG tablet Take 1 tablet by mouth every 6 (six) hours as needed for moderate pain or severe pain. (Patient not taking: Reported on 08/23/2016) 20 tablet 0   No current facility-administered medications for this visit.        Physical Exam: BP 101/66   Pulse 100   Resp 16   Ht 5' 9.5" (1.765 m)   Wt 200 lb 9.6 oz (91 kg)   SpO2 94% Comment: ON RA  BMI 29.20 kg/m   General appearance: alert and cooperative Neurologic: intact Heart: regular rate and rhythm, S1, S2 normal, no murmur, click, rub or gallop Lungs: diminished breath sounds LLL Abdomen: soft, non-tender; bowel sounds normal; no masses,  no organomegaly Extremities: extremities normal, atraumatic, no cyanosis or edema and Homans sign is negative, no sign of DVT Wound: Pleurx catheter is intact, slight eschar chest tube site no infection.   Diagnostic Studies & Laboratory data:     Recent Radiology Findings:  Dg Chest 2 View  Result Date: 08/23/2016 CLINICAL DATA:  Left pneumothorax EXAM: CHEST  2 VIEW COMPARISON:  08/09/2016 FINDINGS: Pleural catheter at the left base with tunnel appearance. Small left basilar pneumothorax. There is lateral pleural fluid and thickening based on 08/02/2016 chest CT. Mildly improved left lung inflation. The right chest is clear. Non obscured heart size stable. IMPRESSION: Stable positioning of tunneled pleural catheter. Borderline decrease in left pleural effusion with small basilar pneumothorax. Stable loculated fluid and pleural thickening along the left lateral chest wall. Electronically Signed   By: Monte Fantasia M.D.   On: 08/23/2016 08:51    PATH: [facility] OR CORRECTION: [id number]: Addendum issued to report immunohistochemistry findings. (JDP:kh 08-15-16) FINAL DIAGNOSIS Diagnosis 1. Pleura, biopsy, Left - POORLY DIFFERENTIATED MALIGNANCY. - SEE MICROSCOPIC DESCRIPTION. 2. Pleura, biopsy, Left - POORLY DIFFERENTIATED MALIGNANCY. - SEE MICROSCOPIC DESCRIPTION. Microscopic Comment 1. & 2. Both the biopsies have similar features and are involved by a poorly differentiated high grade malignancy characterized by sheets and nests of cells with anaplastic nuclear features including nuclear enlargement, prominent nucleoli and frequent mitotic figures. There are also areas of tumor necrosis. Immunohistochemistry shows strong positivity with cytokeratin AE1/AE3, cytokeratin 8/18, PAX-8, and patchy weak positivity with WT-1. The tumor is negative for thyroid transcription factor-1, D2-40, cytokeratin 5/6, cytokeratin 7, cytokeratin 903, cytokeratin 20, p63, S100, HMB-45, prostein and smooth muscle actin. 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 Case discussed with [name] on 08/13/16. (JDP:ah 08/13/16) ADDENDUM: Additional immunohistochemistry is performed and the carcinoma is negative for Calretinin, thyroid transcription factor-1 and Napsin-A. These findings suggest that primary lung adenocarcinoma is unlikely; however, primary lung non-small cell carcinoma is not ruled out and clinical correlation is essential. Case discussed with [name] on 08/15/16. (JDP:kh 08/15/16) Claudette Laws MD  Recent Lab Findings: Lab Results  Component Value Date   WBC 12.8 (H) 08/10/2016   HGB 12.0 (L) 08/10/2016   HCT 35.8 (L) 08/10/2016   PLT 302 08/10/2016   GLUCOSE 101 (H) 08/10/2016   ALT 23 08/08/2016   AST 25 08/08/2016   NA 132 (L) 08/10/2016   K 4.1  08/10/2016   CL 98 (L) 08/10/2016   CREATININE 0.68 08/10/2016   BUN 11 08/10/2016   CO2 26 08/10/2016   TSH 4.746 (H) 08/01/2016   INR 1.24 08/04/2016      Assessment / Plan:    Patient with malignant left pleural effusion, poorly differentiated carcinoma, PCL 1 testing is pending MRI of the brain and PET scan has been scheduled We'll plan drainage of Pleurx today in the office, and removal next week if low drainage  Patient has follow-up appointment with oncology I plan to see back in one month with a follow-up chest x-ray   [name] MD      [address] [place] [address] Office [phone]   Beeper [phone]  08/23/2016 9:31 AM

## 2016-08-27 ENCOUNTER — Ambulatory Visit: Payer: Medicare Other

## 2016-08-27 ENCOUNTER — Encounter (HOSPITAL_COMMUNITY): Payer: Self-pay

## 2016-08-28 ENCOUNTER — Ambulatory Visit (HOSPITAL_COMMUNITY)
Admission: RE | Admit: 2016-08-28 | Discharge: 2016-08-28 | Disposition: A | Discharge status: Home / Self Care | Payer: Medicare Other | Source: Ambulatory Visit | Attending: Cardiothoracic Surgery | Admitting: Cardiothoracic Surgery

## 2016-08-28 ENCOUNTER — Encounter (HOSPITAL_COMMUNITY)
Admission: RE | Disposition: A | Discharge status: Home / Self Care | Payer: Self-pay | Source: Ambulatory Visit | Attending: Cardiothoracic Surgery

## 2016-08-28 DIAGNOSIS — K219 Gastro-esophageal reflux disease without esophagitis: Secondary | ICD-10-CM | POA: Diagnosis not present

## 2016-08-28 DIAGNOSIS — I2510 Atherosclerotic heart disease of native coronary artery without angina pectoris: Secondary | ICD-10-CM | POA: Diagnosis not present

## 2016-08-28 DIAGNOSIS — C61 Malignant neoplasm of prostate: Secondary | ICD-10-CM | POA: Diagnosis not present

## 2016-08-28 DIAGNOSIS — I10 Essential (primary) hypertension: Secondary | ICD-10-CM | POA: Insufficient documentation

## 2016-08-28 DIAGNOSIS — J90 Pleural effusion, not elsewhere classified: Secondary | ICD-10-CM | POA: Insufficient documentation

## 2016-08-28 HISTORY — PX: REMOVAL OF PLEURAL DRAINAGE CATHETER: SHX5080

## 2016-08-28 SURGERY — REMOVAL, CLOSED DRAINAGE CATHETER SYSTEM, PLEURAL
Anesthesia: LOCAL | Laterality: Left

## 2016-08-28 MED ORDER — LIDOCAINE HCL (PF) 1 % IJ SOLN
INTRAMUSCULAR | Status: AC
  Filled 2016-08-28: qty 5

## 2016-08-28 NOTE — Progress Notes (Addendum)
      [address]       [place] [address]             [phone]    HPI: This is a 76 year old male patient with a past medical hisotry of prostate cancer, HDL, CAD, GERD, and HTN who presented to the hospital on 6/26 with progressive shortness of breath. He was found to have a large left pleural effusion. A pleurx cather was placed and now he is only able to drain 76ml out of the catheter. Today when drained I was only able to get roughly 14ml to drain.    Procedure: Left pleurx catheter removal for large left pleural effusion now resolved.  Consent: verbal and written consent obtained  Pre-procedure assessment: stable  The procedure was explained in detail to the patient and son at the bedside. The catheter was first drained using the pleurx catheter glass bottle system. The skin was prepped with 3 choraprep sticks. The area was draped in a sterile fashion. The suture around the tube was removed. 58mL of 1% lidocaine solution was injected into the skin. Once numb, I began dissecting around the cuff with a hemastat. Once free, the chest tube was removed. I held pressure on the site for about a minute until hemostasis was accomplished. I secured the incision with one 3.0 suture. I dressed the area with a sterile dressing and tape. I instructed the patient to leave the dressing in place for 24 hours.   Post-procedure: same  Appointments: Suture removal appointment on 7/31 at 10:30. Appointment with [name] on 8/16 at 1:15pm.   Instruction given to the patient. The patient tolerated the procedure well. All questions were answered to the patient's satisfaction.    Nicholes Rough, PA-C

## 2016-08-28 NOTE — Discharge Instructions (Signed)
DC instructions Given by PA Nicholes Rough.

## 2016-08-29 ENCOUNTER — Ambulatory Visit (HOSPITAL_COMMUNITY)
Admission: RE | Admit: 2016-08-29 | Discharge: 2016-08-29 | Disposition: A | Discharge status: Home / Self Care | Payer: Medicare Other | Source: Ambulatory Visit | Attending: Internal Medicine | Admitting: Internal Medicine

## 2016-08-29 ENCOUNTER — Encounter (HOSPITAL_COMMUNITY): Payer: Self-pay | Admitting: Cardiothoracic Surgery

## 2016-08-29 ENCOUNTER — Encounter (HOSPITAL_COMMUNITY): Payer: Self-pay

## 2016-08-29 DIAGNOSIS — N289 Disorder of kidney and ureter, unspecified: Secondary | ICD-10-CM | POA: Diagnosis not present

## 2016-08-29 DIAGNOSIS — R590 Localized enlarged lymph nodes: Secondary | ICD-10-CM | POA: Insufficient documentation

## 2016-08-29 DIAGNOSIS — C3492 Malignant neoplasm of unspecified part of left bronchus or lung: Secondary | ICD-10-CM

## 2016-08-29 DIAGNOSIS — R918 Other nonspecific abnormal finding of lung field: Secondary | ICD-10-CM | POA: Insufficient documentation

## 2016-08-29 LAB — GLUCOSE, CAPILLARY: GLUCOSE-CAPILLARY: 104 mg/dL — AB (ref 65–99)

## 2016-08-29 MED ORDER — GADOBENATE DIMEGLUMINE 529 MG/ML IV SOLN
20.0000 mL | Freq: Once | INTRAVENOUS | Status: AC | PRN
  Administered 2016-08-29: 20 mL via INTRAVENOUS

## 2016-08-29 MED ORDER — FLUDEOXYGLUCOSE F - 18 (FDG) INJECTION
9.8000 | Freq: Once | INTRAVENOUS | Status: AC | PRN
  Administered 2016-08-29: 9.8 via INTRAVENOUS

## 2016-08-30 LAB — FUNGUS CULTURE WITH STAIN

## 2016-08-30 LAB — FUNGAL ORGANISM REFLEX

## 2016-08-30 LAB — FUNGUS CULTURE RESULT

## 2016-09-05 ENCOUNTER — Other Ambulatory Visit: Payer: Medicare Other

## 2016-09-05 ENCOUNTER — Ambulatory Visit: Payer: Medicare Other | Admitting: Internal Medicine

## 2016-09-05 LAB — FUNGUS CULTURE WITH STAIN

## 2016-09-05 LAB — FUNGUS CULTURE RESULT

## 2016-09-05 LAB — FUNGAL ORGANISM REFLEX

## 2016-09-06 ENCOUNTER — Ambulatory Visit (INDEPENDENT_AMBULATORY_CARE_PROVIDER_SITE_OTHER): Payer: Self-pay

## 2016-09-06 ENCOUNTER — Telehealth: Payer: Self-pay | Admitting: *Deleted

## 2016-09-06 DIAGNOSIS — Z4802 Encounter for removal of sutures: Secondary | ICD-10-CM

## 2016-09-06 NOTE — Telephone Encounter (Signed)
Oncology Nurse Navigator Documentation  Oncology Nurse Navigator Flowsheets 09/06/2016  Navigator Location [facility]  Navigator Encounter Type Telephone/per Dr. Julien Nordmann, he would like to see patient on 09/10/16 at 1:30.  I called patient but was unable to reach him or leave vm message.   Telephone Outgoing Call  Treatment Phase Pre-Tx/Tx Discussion  Barriers/Navigation Needs Coordination of Care  Interventions Coordination of Care  Coordination of Care Other  Acuity Level 1  Time Spent with Patient 15

## 2016-09-06 NOTE — Telephone Encounter (Signed)
Oncology Nurse Navigator Documentation  Oncology Nurse Navigator Flowsheets 09/06/2016  Navigator Location [facility]  Navigator Encounter Type Telephone/called patient to set up an appt but was unable to reach or leave vm message.   Telephone Outgoing Call  Treatment Phase Pre-Tx/Tx Discussion  Barriers/Navigation Needs Coordination of Care  Interventions Coordination of Care  Coordination of Care Other  Acuity Level 1  Time Spent with Patient 15

## 2016-09-06 NOTE — Progress Notes (Signed)
Removed 1 suture from left pleurX site, no signs of infection and patient tolerated well.

## 2016-09-07 ENCOUNTER — Telehealth: Payer: Self-pay | Admitting: *Deleted

## 2016-09-07 NOTE — Telephone Encounter (Signed)
Oncology Nurse Navigator Documentation  Oncology Nurse Navigator Flowsheets 09/07/2016  Navigator Location [facility]  Navigator Encounter Type Telephone/Jeremy Chan called me today.  He updated me he is seeing Dr. Nunzio Cobbs med onc at [facility].  He would like appt with Dr. Julien Nordmann cancelled. He would also like any test results forward to [facility].  I called [name] office and they are updated on molecular test results.  I will update Dr. Julien Nordmann  Telephone Incoming Call  Treatment Phase Pre-Tx/Tx Discussion  Barriers/Navigation Needs Coordination of Care  Interventions Coordination of Care  Coordination of Care Other  Acuity Level 2  Time Spent with Patient 30

## 2016-09-07 NOTE — Telephone Encounter (Signed)
Oncology Nurse Navigator Documentation  Oncology Nurse Navigator Flowsheets 09/07/2016  Navigator Location [facility]  Navigator Encounter Type Telephone/per Dr. Julien Nordmann, I called patient to arrange an appt. I was unable to reach him but left vm message for him to call me with my name and phone number.   Telephone Outgoing Call  Treatment Phase Pre-Tx/Tx Discussion  Barriers/Navigation Needs Coordination of Care  Interventions Coordination of Care  Coordination of Care Other  Acuity Level 1  Time Spent with Patient 15

## 2016-09-19 ENCOUNTER — Other Ambulatory Visit: Payer: Self-pay | Admitting: Cardiothoracic Surgery

## 2016-09-19 DIAGNOSIS — J939 Pneumothorax, unspecified: Secondary | ICD-10-CM

## 2016-09-19 LAB — ACID FAST CULTURE WITH REFLEXED SENSITIVITIES: ACID FAST CULTURE - AFSCU3: NEGATIVE

## 2016-09-19 NOTE — Progress Notes (Signed)
[address]       [place] [address]             [place] Record #[id number] Date of Birth: 1941-02-03  Referring: Rigoberto Noel, MD Primary Care: Haze Rushing, MD  Chief Complaint:   POST OP FOLLOW UP 08/06/2016 OPERATIVE REPORT PREOPERATIVE DIAGNOSIS:  Recurrent left pleural effusion. POSTOPERATIVE DIAGNOSIS:  Left malignant pleural effusion.  Final path pending. PROCEDURE PERFORMED:  Bronchoscopy, left video-assisted thoracoscopy, drainage of pleural effusion, pleural biopsies, multiple, placement of Pleurx catheter.  History of Present Illness:     Patient returns to office today in after recent left VATS drainage of pleural effusion pleural biopsies.  He remains weak but overall his rest for status is improved. He's been seen at [facility] and started on his current therapy.   Current treatement: ipilimumab (YERVOY) 270 mg in sodium chloride 0.9 % chemo infusion  09/13/2016   nivolumab (OPDIVO) 90 mg in sodium chloride 0.9 % chemo infusion  09/13/2016       Past Medical History:  Diagnosis Date  . Adenocarcinoma of left lung, stage 4 ([place]) 08/13/2016  . Age-related bone loss   . Erectile dysfunction   . GERD (gastroesophageal reflux disease)   . Hyperkalemia   . Hyperlipidemia   . Hypertension   . Other dietary vitamin B12 deficiency anemia   . Prostate cancer ([place])   . Urinary tract infection   . Vitamin D deficiency      History  Smoking Status  . Former Smoker  . Types: Cigarettes  . Quit date: 1986  Smokeless Tobacco  . Never Used    History  Alcohol Use  . Yes     Allergies  Allergen Reactions  . No Known Allergies     Current Outpatient Prescriptions  Medication Sig Dispense Refill  . amLODipine-benazepril (LOTREL) 5-20 MG capsule Take 1 capsule by mouth daily.    Marland Kitchen aspirin EC 81 MG tablet Take 81 mg by mouth daily.    Marland Kitchen atorvastatin (LIPITOR) 40 MG tablet Take 40 mg by  mouth daily.    . bisacodyl (DULCOLAX) 10 MG suppository Place 1 suppository (10 mg total) rectally daily as needed for moderate constipation. (Patient taking differently: Place 10 mg rectally daily as needed (for constipation.). ) 12 suppository 0  . lactose free nutrition (BOOST) LIQD Take 237 mLs by mouth 2 (two) times daily between meals.    . ondansetron (ZOFRAN) 4 MG tablet Take 1 tablet (4 mg total) by mouth every 6 (six) hours as needed for nausea. 20 tablet 0  . polyethylene glycol (MIRALAX / GLYCOLAX) packet Take 17 g by mouth daily. (Patient taking differently: Take 17 g by mouth daily as needed (for constipation). ) 14 each 0   No current facility-administered medications for this visit.        Physical Exam: BP 101/68 (BP Location: Left Arm, Cuff Size: Normal)   Pulse 96   Resp 20   Ht 5' 9.5" (1.765 m)   Wt 200 lb 6.4 oz (90.9 kg)   SpO2 96% Comment: RA  BMI 29.17 kg/m   General appearance: alert, cooperative and appears older than stated age Neurologic: intact Heart: regular rate and rhythm, S1, S2 normal, no murmur, click, rub or gallop Lungs: diminished breath sounds LLL Abdomen: soft, non-tender; bowel sounds normal; no masses,  no organomegaly Extremities: extremities normal, atraumatic, no cyanosis or edema  and Homans sign is negative, no sign of DVT Wound: Patient's chest tube sites are healed without drainage or evidence of infection, subcutaneously there is some thickening around both chest tube sites(of some concern that it may be tumor growing in the chest tube track   Diagnostic Studies & Laboratory data:     Recent Radiology Findings:   Dg Chest 2 View  Result Date: 09/20/2016 CLINICAL DATA:  Left pleural effusion EXAM: CHEST  2 VIEW COMPARISON:  08/23/2016 FINDINGS: Stable left pleural effusion. Heterogeneous opacities in the left mid and lower lung zone are stable. The left chest tube is been removed. Right lung is clear. Normal heart size. No  pneumothorax. IMPRESSION: Stable left pleural effusion and pulmonary parenchymal changes after chest tube removal. Electronically Signed   By: Marybelle Killings M.D.   On: 09/20/2016 13:23    PATH: [place] OR CORRECTION: [id number]: Addendum issued to report immunohistochemistry findings. (JDP:kh 08-15-16) FINAL DIAGNOSIS Diagnosis 1. Pleura, biopsy, Left - POORLY DIFFERENTIATED MALIGNANCY. - SEE MICROSCOPIC DESCRIPTION. 2. Pleura, biopsy, Left - POORLY DIFFERENTIATED MALIGNANCY. - SEE MICROSCOPIC DESCRIPTION. Microscopic Comment 1. & 2. Both the biopsies have similar features and are involved by a poorly differentiated high grade malignancy characterized by sheets and nests of cells with anaplastic nuclear features including nuclear enlargement, prominent nucleoli and frequent mitotic figures. There are also areas of tumor necrosis. Immunohistochemistry shows strong positivity with cytokeratin AE1/AE3, cytokeratin 8/18, PAX-8, and patchy weak positivity with WT-1. The tumor is negative for thyroid transcription factor-1, D2-40, cytokeratin 5/6, cytokeratin 7, cytokeratin 903, cytokeratin 20, p63, S100, HMB-45, prostein and smooth muscle actin. 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 Case discussed with [name] on 08/13/16. (JDP:ah 08/13/16) ADDENDUM: Additional immunohistochemistry is performed and the carcinoma is negative for Calretinin, thyroid transcription factor-1 and Napsin-A. These findings suggest that primary lung adenocarcinoma is unlikely; however, primary lung non-small cell carcinoma is not ruled out and clinical correlation is essential. Case discussed with [name] on 08/15/16. (JDP:kh 08/15/16) Claudette Laws MD  Recent Lab Findings: Lab Results  Component Value Date   WBC 12.8 (H) 08/10/2016   HGB 12.0 (L) 08/10/2016   HCT  35.8 (L) 08/10/2016   PLT 302 08/10/2016   GLUCOSE 101 (H) 08/10/2016   ALT 23 08/08/2016   AST 25 08/08/2016   NA 132 (L) 08/10/2016   K 4.1 08/10/2016   CL 98 (L) 08/10/2016   CREATININE 0.68 08/10/2016   BUN 11 08/10/2016   CO2 26 08/10/2016   TSH 4.746 (H) 08/01/2016   INR 1.24 08/04/2016      Assessment / Plan:    Patient with malignant left pleural effusion, poorly differentiated carcinoma, Unclear if renal or lung primary. Patient has been seen by oncology at [facility] and started on immune therapy. His chest x-ray is basically unchanged with significant left pleural thickening. I discussed with the patient and his daughter the thickening along the chest tube tract that will need to be monitored closely, if it enlarges needle biopsy of this area may be indicated. Plan to see the patient back in 3 months with a follow-up chest x-ray or sooner as necessary   [name] MD      [address] [place] [address] Office [phone]   Beeper [phone]  09/20/2016 2:21 PM

## 2016-09-20 ENCOUNTER — Ambulatory Visit (INDEPENDENT_AMBULATORY_CARE_PROVIDER_SITE_OTHER): Payer: Self-pay | Admitting: Cardiothoracic Surgery

## 2016-09-20 ENCOUNTER — Encounter: Payer: Self-pay | Admitting: Cardiothoracic Surgery

## 2016-09-20 ENCOUNTER — Ambulatory Visit
Admission: RE | Admit: 2016-09-20 | Discharge: 2016-09-20 | Disposition: A | Discharge status: Home / Self Care | Payer: Medicare Other | Source: Ambulatory Visit | Attending: Cardiothoracic Surgery | Admitting: Cardiothoracic Surgery

## 2016-09-20 VITALS — BP 101/68 | HR 96 | Resp 20 | Ht 69.5 in | Wt 200.4 lb

## 2016-09-20 DIAGNOSIS — J939 Pneumothorax, unspecified: Secondary | ICD-10-CM

## 2016-09-20 DIAGNOSIS — J90 Pleural effusion, not elsewhere classified: Secondary | ICD-10-CM

## 2016-09-20 DIAGNOSIS — Z09 Encounter for follow-up examination after completed treatment for conditions other than malignant neoplasm: Secondary | ICD-10-CM

## 2016-11-13 ENCOUNTER — Encounter: Payer: Self-pay | Admitting: Cardiothoracic Surgery

## 2016-12-18 ENCOUNTER — Other Ambulatory Visit: Payer: Self-pay | Admitting: *Deleted

## 2016-12-18 ENCOUNTER — Ambulatory Visit
Admission: RE | Admit: 2016-12-18 | Discharge: 2016-12-18 | Disposition: A | Discharge status: Home / Self Care | Payer: Medicare Other | Source: Ambulatory Visit | Attending: Cardiothoracic Surgery | Admitting: Cardiothoracic Surgery

## 2016-12-18 ENCOUNTER — Other Ambulatory Visit: Payer: Self-pay | Admitting: Cardiothoracic Surgery

## 2016-12-18 DIAGNOSIS — R0602 Shortness of breath: Secondary | ICD-10-CM

## 2016-12-18 DIAGNOSIS — C3492 Malignant neoplasm of unspecified part of left bronchus or lung: Secondary | ICD-10-CM

## 2016-12-20 ENCOUNTER — Other Ambulatory Visit: Payer: Self-pay

## 2016-12-20 ENCOUNTER — Encounter: Payer: Self-pay | Admitting: Cardiothoracic Surgery

## 2016-12-20 ENCOUNTER — Ambulatory Visit (INDEPENDENT_AMBULATORY_CARE_PROVIDER_SITE_OTHER): Payer: Medicare Other | Admitting: Cardiothoracic Surgery

## 2016-12-20 VITALS — BP 85/63 | HR 92 | Ht 69.5 in | Wt 202.0 lb

## 2016-12-20 DIAGNOSIS — Z09 Encounter for follow-up examination after completed treatment for conditions other than malignant neoplasm: Secondary | ICD-10-CM | POA: Diagnosis not present

## 2016-12-20 DIAGNOSIS — J90 Pleural effusion, not elsewhere classified: Secondary | ICD-10-CM | POA: Diagnosis not present

## 2016-12-20 NOTE — Progress Notes (Signed)
[address]       [place] [address]             [place] Record #[id number] Date of Birth: [birth year]  Referring: Rigoberto Noel, MD Primary Care: Haze Rushing, MD  Chief Complaint:   POST OP FOLLOW UP 08/06/2016 OPERATIVE REPORT PREOPERATIVE DIAGNOSIS:  Recurrent left pleural effusion. POSTOPERATIVE DIAGNOSIS:  Left malignant pleural effusion.  Final path pending. PROCEDURE PERFORMED:  Bronchoscopy, left video-assisted thoracoscopy, drainage of pleural effusion, pleural biopsies, multiple, placement of Pleurx catheter.  History of Present Illness:     Patient returns to the office in follow-up after drainage of pleural effusion effusion and biopsy Done in July .  The patient continues on immune therapy at [facility].  He is felt somewhat worse over the past 4-5 days, notes his blood pressure has been low.  He had EMS come to his house couple days ago.  He denies any fever or chills    Current treatement: ipilimumab (YERVOY) 270 mg in sodium chloride 0.9 % chemo infusion     nivolumab (OPDIVO) 90 mg in sodium chloride 0.9 % chemo infusion         Past Medical History:  Diagnosis Date  . Adenocarcinoma of left lung, stage 4 ([place]) 08/13/2016  . Age-related bone loss   . Erectile dysfunction   . GERD (gastroesophageal reflux disease)   . Hyperkalemia   . Hyperlipidemia   . Hypertension   . Other dietary vitamin B12 deficiency anemia   . Prostate cancer ([place])   . Urinary tract infection   . Vitamin D deficiency      Social History   Tobacco Use  Smoking Status Former Smoker  . Types: Cigarettes  . Last attempt to quit: 1986  . Years since quitting: 32.8  Smokeless Tobacco Never Used    Social History   Substance and Sexual Activity  Alcohol Use Yes     Allergies  Allergen Reactions  . No Known Allergies     Current Outpatient Medications  Medication Sig Dispense Refill  .  amLODipine-benazepril (LOTREL) 5-20 MG capsule Take 1 capsule by mouth daily.    Marland Kitchen aspirin EC 81 MG tablet Take 81 mg by mouth daily.    Marland Kitchen atorvastatin (LIPITOR) 40 MG tablet Take 40 mg by mouth daily.    . bisacodyl (DULCOLAX) 10 MG suppository Place 1 suppository (10 mg total) rectally daily as needed for moderate constipation. (Patient taking differently: Place 10 mg rectally daily as needed (for constipation.). ) 12 suppository 0  . lactose free nutrition (BOOST) LIQD Take 237 mLs by mouth 2 (two) times daily between meals.    . ondansetron (ZOFRAN) 4 MG tablet Take 1 tablet (4 mg total) by mouth every 6 (six) hours as needed for nausea. 20 tablet 0  . polyethylene glycol (MIRALAX / GLYCOLAX) packet Take 17 g by mouth daily. (Patient taking differently: Take 17 g by mouth daily as needed (for constipation). ) 14 each 0   No current facility-administered medications for this visit.        Physical Exam: BP (!) 85/63   Pulse 92   Ht 5' 9.5" (1.765 m)   Wt 202 lb (91.6 kg)   SpO2 98%   BMI 29.40 kg/m    Today in office his temperature is 97, laying down pulse was 82 blood pressure is 98/72, sitting his blood pressure dropped to  82/58 remain the same when standing pulse increased to 91 with standing General appearance: alert, cooperative, appears stated age and no distress Head: Normocephalic, without obvious abnormality, atraumatic Neck: no adenopathy, no carotid bruit, no JVD, supple, symmetrical, trachea midline and thyroid not enlarged, symmetric, no tenderness/mass/nodules Lymph nodes: Cervical, supraclavicular, and axillary nodes normal. Resp: diminished breath sounds LLL Back: symmetric, no curvature. ROM normal. No CVA tenderness. Cardio: regular rate and rhythm, S1, S2 normal, no murmur, click, rub or gallop GI: soft, non-tender; bowel sounds normal; no masses,  no organomegaly Extremities: extremities normal, atraumatic, no cyanosis or edema and Homans sign is negative, no  sign of DVT Neurologic: Grossly normal Patient's previous chest tube sites are smooth well-healed without any palpable masses, on previous exam had been concerned about some thickening at the posterior chest tube site as as possible chest wall invasion of tumor however this area has completely resolved currently.  Diagnostic Studies & Laboratory data:     Recent Radiology Findings:   Dg Chest 2 View  Result Date: 12/18/2016 CLINICAL DATA:  Increased shortness of breath EXAM: CHEST  2 VIEW COMPARISON:  09/20/2016, 08/29/2016 FINDINGS: The right lung is clear. Left pleural disease is improved since the prior radiograph. Elevated left diaphragm with opacity at the left base, also slightly improved. Normal cardiomediastinal silhouette. No pneumothorax. IMPRESSION: 1. Left pleural and parenchymal disease appears slightly improved since prior radiograph 09/20/2016. 2. The right lung is clear Electronically Signed   By: Donavan Foil M.D.   On: 12/18/2016 15:28      PATH: [place] OR CORRECTION: [id number]: Addendum issued to report immunohistochemistry findings. (JDP:kh 08-15-16) FINAL DIAGNOSIS Diagnosis 1. Pleura, biopsy, Left - POORLY DIFFERENTIATED MALIGNANCY. - SEE MICROSCOPIC DESCRIPTION. 2. Pleura, biopsy, Left - POORLY DIFFERENTIATED MALIGNANCY. - SEE MICROSCOPIC DESCRIPTION. Microscopic Comment 1. & 2. Both the biopsies have similar features and are involved by a poorly differentiated high grade malignancy characterized by sheets and nests of cells with anaplastic nuclear features including nuclear enlargement, prominent nucleoli and frequent mitotic figures. There are also areas of tumor necrosis. Immunohistochemistry shows strong positivity with cytokeratin AE1/AE3, cytokeratin 8/18, PAX-8, and patchy weak positivity with WT-1. The tumor is negative for thyroid transcription factor-1, D2-40, cytokeratin 5/6, cytokeratin 7, cytokeratin 903, cytokeratin  20, p63, S100, HMB-45, prostein and smooth muscle actin. 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 Case discussed with [name] on 08/13/16. (JDP:ah 08/13/16) ADDENDUM: Additional immunohistochemistry is performed and the carcinoma is negative for Calretinin, thyroid transcription factor-1 and Napsin-A. These findings suggest that primary lung adenocarcinoma is unlikely; however, primary lung non-small cell carcinoma is not ruled out and clinical correlation is essential. Case discussed with [name] on 08/15/16. (JDP:kh 08/15/16) Claudette Laws MD  Recent Lab Findings: Lab Results  Component Value Date   WBC 12.8 (H) 08/10/2016   HGB 12.0 (L) 08/10/2016   HCT 35.8 (L) 08/10/2016   PLT 302 08/10/2016   GLUCOSE 101 (H) 08/10/2016   ALT 23 08/08/2016   AST 25 08/08/2016   NA 132 (L) 08/10/2016   K 4.1 08/10/2016   CL 98 (L) 08/10/2016   CREATININE 0.68 08/10/2016   BUN 11 08/10/2016   CO2 26 08/10/2016   TSH 4.746 (H) 08/01/2016   INR 1.24 08/04/2016    Assessment / Plan:    Patient with malignant left pleural effusion, poorly differentiated carcinoma, Unclear if renal or lung primary. Patient has  been seen by oncology at [facility] and started on immune therapy. His chest x-ray is basically unchanged with significant left pleural thickening. With the patient's orthostatic blood pressure changes and feeling poorly for the past 4-5 days, and to avoid hospitalization I suggested that the patient, have labs checked and as an outpatient given some IV fluid.  His daughter his contacted his oncologist at [facility] who is willing to see him today and facilitate this.   The patient is closely followed at [facility], I told him I be glad to follow-up with a chest x-ray in 6 weeks but will leave that to his and his oncologist discretion.  [name] MD      [address] [place] [address] Office [phone]   Beeper [phone]  12/20/2016 10:40 AM

## 2017-01-30 ENCOUNTER — Other Ambulatory Visit: Payer: Self-pay | Admitting: Cardiothoracic Surgery

## 2017-01-30 DIAGNOSIS — C3492 Malignant neoplasm of unspecified part of left bronchus or lung: Secondary | ICD-10-CM

## 2017-01-31 ENCOUNTER — Ambulatory Visit (INDEPENDENT_AMBULATORY_CARE_PROVIDER_SITE_OTHER): Payer: Medicare Other | Admitting: Cardiothoracic Surgery

## 2017-01-31 ENCOUNTER — Encounter: Payer: Self-pay | Admitting: Cardiothoracic Surgery

## 2017-01-31 ENCOUNTER — Other Ambulatory Visit: Payer: Self-pay

## 2017-01-31 VITALS — BP 165/80 | HR 84 | Resp 18 | Ht 69.0 in | Wt 214.4 lb

## 2017-01-31 DIAGNOSIS — J90 Pleural effusion, not elsewhere classified: Secondary | ICD-10-CM

## 2017-01-31 DIAGNOSIS — Z09 Encounter for follow-up examination after completed treatment for conditions other than malignant neoplasm: Secondary | ICD-10-CM

## 2017-01-31 NOTE — Progress Notes (Signed)
[address]       [place] [address]             [phone]      Zenon W Bas [place] Medical Record #[id number] Date of Birth: 01/01/1941  Referring: Haze Rushing, MD Primary Care: Haze Rushing, MD  Chief Complaint:   POST OP FOLLOW UP 08/06/2016 OPERATIVE REPORT PREOPERATIVE DIAGNOSIS:  Recurrent left pleural effusion. POSTOPERATIVE DIAGNOSIS:  Left malignant pleural effusion.  Final path pending. PROCEDURE PERFORMED:  Bronchoscopy, left video-assisted thoracoscopy, drainage of pleural effusion, pleural biopsies, multiple, placement of Pleurx catheter.  History of Present Illness:     Patient returns to the office today in follow-up after developing malignant recurrent left pleural effusion, treated with VATS drainage pleural biopsies.  The patient is now actively treated for renal cell carcinoma with immune therapy.  Recent CT of the chest was done at [facility] showing response to therapy.  Overall the patient feels well, comes to the office today on his own.  Note that his appetite and energy level is much improved.   Current treatement: ipilimumab (YERVOY) 270 mg in sodium chloride 0.9 % chemo infusion     nivolumab (OPDIVO) 90 mg in sodium chloride 0.9 % chemo infusion         Past Medical History:  Diagnosis Date  . Adenocarcinoma of left lung, stage 4 ([place]) 08/13/2016  . Age-related bone loss   . Erectile dysfunction   . GERD (gastroesophageal reflux disease)   . Hyperkalemia   . Hyperlipidemia   . Hypertension   . Other dietary vitamin B12 deficiency anemia   . Prostate cancer ([place])   . Urinary tract infection   . Vitamin D deficiency      Social History   Tobacco Use  Smoking Status Former Smoker  . Types: Cigarettes  . Last attempt to quit: 1986  . Years since quitting: 33.0  Smokeless Tobacco Never Used    Social History   Substance and Sexual Activity  Alcohol Use Yes     Allergies  Allergen Reactions  . No  Known Allergies     Current Outpatient Medications  Medication Sig Dispense Refill  . amLODipine-benazepril (LOTREL) 5-20 MG capsule Take 1 capsule by mouth daily.    Marland Kitchen aspirin EC 81 MG tablet Take 81 mg by mouth daily.    Marland Kitchen atorvastatin (LIPITOR) 40 MG tablet Take 40 mg by mouth daily.    . bisacodyl (DULCOLAX) 10 MG suppository Place 1 suppository (10 mg total) rectally daily as needed for moderate constipation. (Patient taking differently: Place 10 mg rectally daily as needed (for constipation.). ) 12 suppository 0  . lactose free nutrition (BOOST) LIQD Take 237 mLs by mouth 2 (two) times daily between meals.    . ondansetron (ZOFRAN) 4 MG tablet Take 1 tablet (4 mg total) by mouth every 6 (six) hours as needed for nausea. 20 tablet 0  . polyethylene glycol (MIRALAX / GLYCOLAX) packet Take 17 g by mouth daily. (Patient taking differently: Take 17 g by mouth daily as needed (for constipation). ) 14 each 0   No current facility-administered medications for this visit.        Physical Exam: BP (!) 165/80 (BP Location: Right Arm, Patient Position: Sitting, Cuff Size: Large)   Pulse 84   Resp 18   Ht 5\' 9"  (1.753 m)   Wt 214 lb 6.4 oz (97.3 kg)   SpO2 98% Comment: on RA  BMI 31.66  kg/m   General appearance: alert, cooperative and appears stated age Head: Normocephalic, without obvious abnormality, atraumatic Neck: no adenopathy, no carotid bruit, no JVD, supple, symmetrical, trachea midline and thyroid not enlarged, symmetric, no tenderness/mass/nodules Lymph nodes: Cervical, supraclavicular, and axillary nodes normal. Resp: clear to auscultation bilaterally Back: symmetric, no curvature. ROM normal. No CVA tenderness. Cardio: regular rate and rhythm, S1, S2 normal, no murmur, click, rub or gallop GI: soft, non-tender; bowel sounds normal; no masses,  no organomegaly Extremities: extremities normal, atraumatic, no cyanosis or edema and Homans sign is negative, no sign of  DVT Neurologic: Grossly normal Patient's previous chest tube sites are smooth well-healed without any palpable masses, on previous exam had been concerned about some thickening at the posterior chest tube site as as possible chest wall invasion of tumor however this area has completely resolved currently.  Diagnostic Studies & Laboratory data:     Recent Radiology Findings:  CT done at [facility] 01/22/17 1.Overall findings reflect a positive treatment response: Interval decrease in the bulk of the right renal mass with improved retroperitoneal adenopathy since outside PET/CT study 08/29/2016 (allowing for cross modality variation) as detailed above. There is a residual 8 mm lymph node near the right renal hilum, posterior to the right renal artery. No hydronephrosis. 2.Posterior surgical changes of left pleurodesis with improved but residual circumferential nodular thickening of left pleura and small residual left pleural effusion since recent CT chest of 10/29/2016. Stable 3 mm right lung nodule. 3.Stable right hilar 13 mm lymph node. No new adenopathy in the chest. 4.Evidence of skeletal metastasis, involving T9 and L4 vertebral bodies. Interval increase in the focal sclerotic metastatic lesions involving the right hemisacrum and right posterior iliac bone since outside PET/CT study 08/29/2016. Suggestion of an incomplete pathologic fracture adjacent to the lesion involving the right hemisacrum (close to the right SI joint). Although this is noted on prior outside imaging, suggest clinical correlation   PATH: [place]: [id number]: Addendum issued to report immunohistochemistry findings. (JDP:kh 08-15-16) FINAL DIAGNOSIS Diagnosis 1. Pleura, biopsy, Left - POORLY DIFFERENTIATED MALIGNANCY. - SEE MICROSCOPIC DESCRIPTION. 2. Pleura, biopsy, Left - POORLY DIFFERENTIATED MALIGNANCY. - SEE MICROSCOPIC DESCRIPTION. Microscopic Comment 1. & 2. Both the  biopsies have similar features and are involved by a poorly differentiated high grade malignancy characterized by sheets and nests of cells with anaplastic nuclear features including nuclear enlargement, prominent nucleoli and frequent mitotic figures. There are also areas of tumor necrosis. Immunohistochemistry shows strong positivity with cytokeratin AE1/AE3, cytokeratin 8/18, PAX-8, and patchy weak positivity with WT-1. The tumor is negative for thyroid transcription factor-1, D2-40, cytokeratin 5/6, cytokeratin 7, cytokeratin 903, cytokeratin 20, p63, S100, HMB-45, prostein and smooth muscle actin. 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 Case discussed with [name] on 08/13/16. (JDP:ah 08/13/16) ADDENDUM: Additional immunohistochemistry is performed and the carcinoma is negative for Calretinin, thyroid transcription factor-1 and Napsin-A. These findings suggest that primary lung adenocarcinoma is unlikely; however, primary lung non-small cell carcinoma is not ruled out and clinical correlation is essential. Case discussed with [name] on 08/15/16. (JDP:kh 08/15/16) Claudette Laws MD  Recent Lab Findings: Lab Results  Component Value Date   WBC 12.8 (H) 08/10/2016   HGB 12.0 (L) 08/10/2016   HCT 35.8 (L) 08/10/2016   PLT 302 08/10/2016   GLUCOSE 101 (H) 08/10/2016   ALT 23 08/08/2016   AST 25 08/08/2016   NA 132 (  L) 08/10/2016   K 4.1 08/10/2016   CL 98 (L) 08/10/2016   CREATININE 0.68 08/10/2016   BUN 11 08/10/2016   CO2 26 08/10/2016   TSH 4.746 (H) 08/01/2016   INR 1.24 08/04/2016    Assessment / Plan:    Patient with advanced stage renal cell carcinoma presenting with left malignant pleural effusion, now stable on current therapy.  I have not made a return appointment to see the patient as he is closely followed at the [facility] center but would be  glad to see him at his or [name] is requested anytime.  [name] MD      [address] [place] [address] Office [phone]   Beeper [phone]  01/31/2017 10:20 AM

## 2018-07-23 ENCOUNTER — Encounter (HOSPITAL_COMMUNITY): Payer: Self-pay

## 2018-07-23 ENCOUNTER — Emergency Department (HOSPITAL_COMMUNITY): Payer: Medicare Other

## 2018-07-23 ENCOUNTER — Inpatient Hospital Stay (HOSPITAL_COMMUNITY)
Admission: EM | Admit: 2018-07-23 | Discharge: 2018-08-01 | DRG: 175 | Disposition: A | Discharge status: Home Health | Payer: Medicare Other | Attending: Internal Medicine | Admitting: Internal Medicine

## 2018-07-23 DIAGNOSIS — Z85118 Personal history of other malignant neoplasm of bronchus and lung: Secondary | ICD-10-CM

## 2018-07-23 DIAGNOSIS — I2510 Atherosclerotic heart disease of native coronary artery without angina pectoris: Secondary | ICD-10-CM | POA: Diagnosis present

## 2018-07-23 DIAGNOSIS — R0602 Shortness of breath: Secondary | ICD-10-CM

## 2018-07-23 DIAGNOSIS — E875 Hyperkalemia: Secondary | ICD-10-CM | POA: Diagnosis present

## 2018-07-23 DIAGNOSIS — C3490 Malignant neoplasm of unspecified part of unspecified bronchus or lung: Secondary | ICD-10-CM

## 2018-07-23 DIAGNOSIS — N179 Acute kidney failure, unspecified: Secondary | ICD-10-CM | POA: Diagnosis not present

## 2018-07-23 DIAGNOSIS — C779 Secondary and unspecified malignant neoplasm of lymph node, unspecified: Secondary | ICD-10-CM | POA: Diagnosis present

## 2018-07-23 DIAGNOSIS — E871 Hypo-osmolality and hyponatremia: Secondary | ICD-10-CM | POA: Diagnosis not present

## 2018-07-23 DIAGNOSIS — Z79899 Other long term (current) drug therapy: Secondary | ICD-10-CM

## 2018-07-23 DIAGNOSIS — I10 Essential (primary) hypertension: Secondary | ICD-10-CM | POA: Diagnosis present

## 2018-07-23 DIAGNOSIS — I119 Hypertensive heart disease without heart failure: Secondary | ICD-10-CM | POA: Diagnosis present

## 2018-07-23 DIAGNOSIS — R509 Fever, unspecified: Secondary | ICD-10-CM

## 2018-07-23 DIAGNOSIS — I2699 Other pulmonary embolism without acute cor pulmonale: Secondary | ICD-10-CM | POA: Diagnosis not present

## 2018-07-23 DIAGNOSIS — Z8042 Family history of malignant neoplasm of prostate: Secondary | ICD-10-CM

## 2018-07-23 DIAGNOSIS — Z87891 Personal history of nicotine dependence: Secondary | ICD-10-CM

## 2018-07-23 DIAGNOSIS — Z7982 Long term (current) use of aspirin: Secondary | ICD-10-CM

## 2018-07-23 DIAGNOSIS — Z8546 Personal history of malignant neoplasm of prostate: Secondary | ICD-10-CM

## 2018-07-23 DIAGNOSIS — E860 Dehydration: Secondary | ICD-10-CM | POA: Diagnosis present

## 2018-07-23 DIAGNOSIS — E785 Hyperlipidemia, unspecified: Secondary | ICD-10-CM | POA: Diagnosis present

## 2018-07-23 DIAGNOSIS — Z923 Personal history of irradiation: Secondary | ICD-10-CM

## 2018-07-23 DIAGNOSIS — C641 Malignant neoplasm of right kidney, except renal pelvis: Secondary | ICD-10-CM | POA: Diagnosis present

## 2018-07-23 DIAGNOSIS — Z833 Family history of diabetes mellitus: Secondary | ICD-10-CM

## 2018-07-23 DIAGNOSIS — Z7952 Long term (current) use of systemic steroids: Secondary | ICD-10-CM

## 2018-07-23 DIAGNOSIS — D6869 Other thrombophilia: Secondary | ICD-10-CM | POA: Diagnosis present

## 2018-07-23 DIAGNOSIS — E872 Acidosis: Secondary | ICD-10-CM | POA: Diagnosis not present

## 2018-07-23 DIAGNOSIS — K219 Gastro-esophageal reflux disease without esophagitis: Secondary | ICD-10-CM | POA: Diagnosis present

## 2018-07-23 DIAGNOSIS — C7951 Secondary malignant neoplasm of bone: Secondary | ICD-10-CM | POA: Diagnosis present

## 2018-07-23 DIAGNOSIS — C3492 Malignant neoplasm of unspecified part of left bronchus or lung: Secondary | ICD-10-CM | POA: Diagnosis present

## 2018-07-23 DIAGNOSIS — Z955 Presence of coronary angioplasty implant and graft: Secondary | ICD-10-CM

## 2018-07-23 DIAGNOSIS — E039 Hypothyroidism, unspecified: Secondary | ICD-10-CM | POA: Diagnosis present

## 2018-07-23 DIAGNOSIS — D509 Iron deficiency anemia, unspecified: Secondary | ICD-10-CM | POA: Diagnosis present

## 2018-07-23 DIAGNOSIS — J40 Bronchitis, not specified as acute or chronic: Secondary | ICD-10-CM | POA: Diagnosis not present

## 2018-07-23 DIAGNOSIS — E230 Hypopituitarism: Secondary | ICD-10-CM | POA: Diagnosis present

## 2018-07-23 DIAGNOSIS — Z8521 Personal history of malignant neoplasm of larynx: Secondary | ICD-10-CM

## 2018-07-23 DIAGNOSIS — D539 Nutritional anemia, unspecified: Secondary | ICD-10-CM | POA: Diagnosis present

## 2018-07-23 DIAGNOSIS — E7800 Pure hypercholesterolemia, unspecified: Secondary | ICD-10-CM | POA: Diagnosis present

## 2018-07-23 DIAGNOSIS — Z1159 Encounter for screening for other viral diseases: Secondary | ICD-10-CM

## 2018-07-23 DIAGNOSIS — J9601 Acute respiratory failure with hypoxia: Secondary | ICD-10-CM | POA: Diagnosis present

## 2018-07-23 LAB — CBC WITH DIFFERENTIAL/PLATELET
Abs Immature Granulocytes: 0.08 10*3/uL — ABNORMAL HIGH (ref 0.00–0.07)
Basophils Absolute: 0 10*3/uL (ref 0.0–0.1)
Basophils Relative: 0 %
Eosinophils Absolute: 0.2 10*3/uL (ref 0.0–0.5)
Eosinophils Relative: 1 %
HCT: 38.1 % — ABNORMAL LOW (ref 39.0–52.0)
Hemoglobin: 12.2 g/dL — ABNORMAL LOW (ref 13.0–17.0)
Immature Granulocytes: 1 %
Lymphocytes Relative: 8 %
Lymphs Abs: 1.1 10*3/uL (ref 0.7–4.0)
MCH: 32.4 pg (ref 26.0–34.0)
MCHC: 32 g/dL (ref 30.0–36.0)
MCV: 101.3 fL — ABNORMAL HIGH (ref 80.0–100.0)
Monocytes Absolute: 1.3 10*3/uL — ABNORMAL HIGH (ref 0.1–1.0)
Monocytes Relative: 9 %
Neutro Abs: 10.9 10*3/uL — ABNORMAL HIGH (ref 1.7–7.7)
Neutrophils Relative %: 81 %
Platelets: 155 10*3/uL (ref 150–400)
RBC: 3.76 MIL/uL — ABNORMAL LOW (ref 4.22–5.81)
RDW: 13.9 % (ref 11.5–15.5)
WBC: 13.5 10*3/uL — ABNORMAL HIGH (ref 4.0–10.5)
nRBC: 0 % (ref 0.0–0.2)

## 2018-07-23 LAB — COMPREHENSIVE METABOLIC PANEL
ALT: 13 U/L (ref 0–44)
AST: 15 U/L (ref 15–41)
Albumin: 4.2 g/dL (ref 3.5–5.0)
Alkaline Phosphatase: 71 U/L (ref 38–126)
Anion gap: 10 (ref 5–15)
BUN: 24 mg/dL — ABNORMAL HIGH (ref 8–23)
CO2: 25 mmol/L (ref 22–32)
Calcium: 9 mg/dL (ref 8.9–10.3)
Chloride: 102 mmol/L (ref 98–111)
Creatinine, Ser: 1.39 mg/dL — ABNORMAL HIGH (ref 0.61–1.24)
GFR calc Af Amer: 55 mL/min — ABNORMAL LOW (ref >60)
GFR calc non Af Amer: 48 mL/min — ABNORMAL LOW (ref >60)
Glucose, Bld: 91 mg/dL (ref 70–99)
Potassium: 3.8 mmol/L (ref 3.5–5.1)
Sodium: 137 mmol/L (ref 135–145)
Total Bilirubin: 0.7 mg/dL (ref 0.3–1.2)
Total Protein: 7.6 g/dL (ref 6.5–8.1)

## 2018-07-23 LAB — CREATININE, URINE, RANDOM: Creatinine, Urine: 75.81 mg/dL

## 2018-07-23 LAB — SODIUM, URINE, RANDOM: Sodium, Ur: 116 mmol/L

## 2018-07-23 LAB — TROPONIN I
Troponin I: <0.03 ng/mL (ref <0.03)
Troponin I: <0.03 ng/mL (ref <0.03)

## 2018-07-23 LAB — PROTIME-INR
INR: 1 (ref 0.8–1.2)
Prothrombin Time: 13.2 seconds (ref 11.4–15.2)

## 2018-07-23 LAB — APTT: aPTT: 29 seconds (ref 24–36)

## 2018-07-23 LAB — SARS CORONAVIRUS 2 BY RT PCR (HOSPITAL ORDER, PERFORMED IN ~~LOC~~ HOSPITAL LAB): SARS Coronavirus 2: NEGATIVE

## 2018-07-23 LAB — BRAIN NATRIURETIC PEPTIDE: B Natriuretic Peptide: 31.6 pg/mL (ref 0.0–100.0)

## 2018-07-23 MED ORDER — HEPARIN BOLUS VIA INFUSION
2400.0000 [IU] | Freq: Once | INTRAVENOUS | Status: AC
  Administered 2018-07-23: 19:00:00 2400 [IU] via INTRAVENOUS
  Filled 2018-07-23: qty 2400

## 2018-07-23 MED ORDER — ACETAMINOPHEN 325 MG PO TABS: 650.0000 mg | ORAL_TABLET | Freq: Four times a day (QID) | ORAL | Status: DC | PRN

## 2018-07-23 MED ORDER — SODIUM CHLORIDE (PF) 0.9 % IJ SOLN
INTRAMUSCULAR | Status: AC
  Filled 2018-07-23: qty 50

## 2018-07-23 MED ORDER — ACETAMINOPHEN 650 MG RE SUPP: 650.0000 mg | Freq: Four times a day (QID) | RECTAL | Status: DC | PRN

## 2018-07-23 MED ORDER — IOHEXOL 350 MG/ML SOLN
100.0000 mL | Freq: Once | INTRAVENOUS | Status: AC | PRN
  Administered 2018-07-23: 100 mL via INTRAVENOUS

## 2018-07-23 MED ORDER — HEPARIN (PORCINE) 25000 UT/250ML-% IV SOLN
1200.0000 [IU]/h | INTRAVENOUS | Status: DC
  Administered 2018-07-23 – 2018-07-24 (×2): 1350 [IU]/h via INTRAVENOUS
  Administered 2018-07-25: 1200 [IU]/h via INTRAVENOUS
  Filled 2018-07-23 (×3): qty 250

## 2018-07-23 MED ORDER — HYDROCODONE-ACETAMINOPHEN 5-325 MG PO TABS
1.0000 | ORAL_TABLET | ORAL | Status: DC | PRN
  Administered 2018-07-26 – 2018-07-31 (×8): 2 via ORAL
  Administered 2018-07-31 – 2018-08-01 (×3): 1 via ORAL
  Filled 2018-07-23: qty 1
  Filled 2018-07-23 (×3): qty 2
  Filled 2018-07-23: qty 1
  Filled 2018-07-23: qty 2
  Filled 2018-07-23: qty 1
  Filled 2018-07-23 (×4): qty 2

## 2018-07-23 MED ORDER — SODIUM CHLORIDE 0.9 % IV BOLUS
1000.0000 mL | Freq: Once | INTRAVENOUS | Status: AC
  Administered 2018-07-23: 14:00:00 1000 mL via INTRAVENOUS

## 2018-07-23 MED ORDER — PREDNISONE 5 MG PO TABS
5.0000 mg | ORAL_TABLET | Freq: Every day | ORAL | Status: DC
  Administered 2018-07-24 – 2018-08-01 (×9): 5 mg via ORAL
  Filled 2018-07-23 (×9): qty 1

## 2018-07-23 MED ORDER — ONDANSETRON HCL 4 MG PO TABS
4.0000 mg | ORAL_TABLET | Freq: Four times a day (QID) | ORAL | Status: DC | PRN
  Administered 2018-07-26 – 2018-07-30 (×2): 4 mg via ORAL
  Filled 2018-07-23 (×2): qty 1

## 2018-07-23 MED ORDER — LEVOTHYROXINE SODIUM 75 MCG PO TABS
75.0000 ug | ORAL_TABLET | Freq: Every day | ORAL | Status: DC
  Administered 2018-07-24 – 2018-08-01 (×9): 75 ug via ORAL
  Filled 2018-07-23 (×9): qty 1

## 2018-07-23 MED ORDER — ATORVASTATIN CALCIUM 40 MG PO TABS
40.0000 mg | ORAL_TABLET | Freq: Every day | ORAL | Status: DC
  Administered 2018-07-24 – 2018-08-01 (×9): 40 mg via ORAL
  Filled 2018-07-23 (×9): qty 1

## 2018-07-23 MED ORDER — SODIUM CHLORIDE 0.9 % IV SOLN
INTRAVENOUS | Status: AC
  Administered 2018-07-23: 22:00:00 via INTRAVENOUS

## 2018-07-23 MED ORDER — ONDANSETRON HCL 4 MG/2ML IJ SOLN
4.0000 mg | Freq: Four times a day (QID) | INTRAMUSCULAR | Status: DC | PRN
  Administered 2018-07-27 – 2018-08-01 (×3): 4 mg via INTRAVENOUS
  Filled 2018-07-23 (×3): qty 2

## 2018-07-23 NOTE — ED Notes (Signed)
Patient arrived via [facility] from home C/O shob this morning.   96% RA in ED.   Patient states he feels "a little bit better now".    Hx. Lung and kidney cancer.

## 2018-07-23 NOTE — ED Notes (Signed)
Son, [name], wants to be primary contact and will update other family. Number is in the chart.

## 2018-07-23 NOTE — H&P (Signed)
Jeremy Chan LOV:[id number] DOB: Apr 01, 1940 DOA: 07/23/2018     PCP: Haze Rushing, MD   Outpatient Specialists:       [facility]       Dr.Thomas, Oswaldo Conroy, MD    Patient arrived to ER on 07/23/18 at 1257  Patient coming from: home Lives  With family    Chief Complaint:  Chief Complaint  Patient presents with   Shortness of Breath    HPI: Jeremy Chan is a 78 y.o. 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, Pan-hypopitutarism    Presented with less of breath worse than his baseline and worse with exertion he got suddenly very short of breath today when he was trying to clean off the urine on the floor when his dog had an accident.  When EMS arrived his oxygen saturation was down to 92% he was placed on nonrebreather secondary to increased work of breathing although by that time his oxygen saturation was 95% room air He has had decreased appetite and fatigue which is been chronic since his diagnosis of cancer having had significant coughing no fevers no chest pain he denies any leg swelling   Recently seen by his oncologist CT 07/16/18 showed enlargement of a right renal neoplasm w/d interval extension into the R renal vein and IVC, enlargement of multiple necrotic mediastinal lymph node conglomerates, and increased nodularity chest and abdomen. Stable multiple sclerotic and lytic foci of osseous metastatic dise.  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 Ladonna Snide in endocrinology.ON  prednisone 5 mg daily.   testosterone every 2 weeks Also on synthroid replacement and therapy     HTN on amLODIPine-benazepril       CAD  - On Aspirin, statin                              PMH of cardiac stents in early 2000s.      Hypothyroidism:  Lab Results  Component Value Date   TSH 4.746 (H) 08/01/2016   on synthroid    While in ER:   Was found to have creatinine elevated at 1.39 WBC count of 13.5 CTA chest showed PE with possible right side strain at no time patient was hypotensive during ER stay  The following Work up has been ordered so far:  Orders Placed This Encounter  Procedures   SARS Coronavirus 2 (CEPHEID - Performed in [facility] hospital lab), [place]   DG Chest 2 View   CT Angio Chest PE W and/or Wo Contrast   Comprehensive metabolic panel  CBC with Differential   APTT   Protime-INR   Troponin I - Once   CBC   Brain natriuretic peptide   Creatinine, urine, random   Sodium, urine, random   Heparin level (unfractionated)   If O2 Sat <94% administer O2 at 2 liters/minute via nasal cannula   Measure blood pressure   Height and weight   Cardiac monitoring   Consult to hospitalist  ALL PATIENTS BEING ADMITTED/HAVING PROCEDURES NEED COVID-19 SCREENING   heparin per pharmacy consult   Consult to care management   Droplet precaution   Pulse oximetry, continuous   EKG 12-Lead   ED EKG   Place in observation (patient's expected length of stay will  be less than 2 midnights)    Following Medications were ordered in ER: Medications  sodium chloride (PF) 0.9 % injection (has no administration in time range)  heparin ADULT infusion 100 units/mL (25000 units/240mL sodium chloride 0.45%) (1,350 Units/hr Intravenous New Bag/Given 07/23/18 1859)  sodium chloride 0.9 % bolus 1,000 mL (0 mLs Intravenous Stopped 07/23/18 1544)  iohexol (OMNIPAQUE) 350 MG/ML injection 100 mL (100 mLs Intravenous Contrast Given 07/23/18 1714)  heparin bolus via infusion 2,400 Units (2,400 Units Intravenous Bolus from Bag 07/23/18 1859)        Consult Orders  (From admission, onward)         Start     Ordered   07/23/18 2004  Consult to care management  Once    Comments: DOAC  Provider:  (Not yet assigned)  Question:  Reason for consult:  Answer:  Medication needs   07/23/18 2004   07/23/18 1803  Consult to hospitalist  ALL PATIENTS BEING ADMITTED/HAVING PROCEDURES NEED COVID-19 SCREENING  Once    Comments: ALL PATIENTS BEING ADMITTED/HAVING PROCEDURES NEED COVID-19 SCREENING  Provider:  (Not yet assigned)  Question Answer Comment  Place call to: [facility]   Reason for Consult Admit      07/23/18 1802           Significant initial  Findings: Abnormal Labs Reviewed  COMPREHENSIVE METABOLIC PANEL - Abnormal; Notable for the following components:      Result Value   BUN 24 (*)    Creatinine, Ser 1.39 (*)    GFR calc non Af Amer 48 (*)    GFR calc Af Amer 55 (*)    All other components within normal limits  CBC WITH DIFFERENTIAL/PLATELET - Abnormal; Notable for the following components:   WBC 13.5 (*)    RBC 3.76 (*)    Hemoglobin 12.2 (*)    HCT 38.1 (*)    MCV 101.3 (*)    Neutro Abs 10.9 (*)    Monocytes Absolute 1.3 (*)    Abs Immature Granulocytes 0.08 (*)    All other components within normal limits     Otherwise labs showing:    Recent Labs  Lab 07/23/18 1257  NA 137  K 3.8  CO2 25  GLUCOSE 91  BUN 24*  CREATININE  1.39*  CALCIUM 9.0    Cr  Up from baseline see below Lab Results  Component Value Date   CREATININE 1.39 (H) 07/23/2018   CREATININE 0.68 08/10/2016   CREATININE 0.66 08/09/2016    Recent Labs  Lab 07/23/18 1257  AST 15  ALT 13  ALKPHOS 71  BILITOT 0.7  PROT 7.6  ALBUMIN 4.2   Lab Results  Component Value Date   CALCIUM 9.0 07/23/2018   PHOS 3.1 08/01/2016  WBC      Component Value Date/Time   WBC 13.5 (H) 07/23/2018 1257   ANC    Component Value Date/Time   NEUTROABS 10.9 (H) 07/23/2018 1257   ALC 1.1  Plt: Lab Results  Component Value Date   PLT 155 07/23/2018   COVID-19 Labs    No results found for: SARSCOV2NAA     HG/HCT  Stable,     Component Value Date/Time   HGB 12.2 (L) 07/23/2018 1257   HCT 38.1 (L) 07/23/2018 1257    Troponin  Cardiac Panel (last 3 results) Recent Labs    07/23/18 1857  TROPONINI <0.03       UA not ordered        CXR -chest x-ray worrisome for recurrence of metastatic spread   CTA chest - acute PE within the main right pulmonary artery with extension into the right middle lobe and right lower lobe pulmonary arteries.CT evidence of right heart strain And some metastatic disease adrenal mass  ECG:  Personally reviewed by me showing: HR : 84  Rhythm:  NSR,  QTC 465      ED Triage Vitals  Enc Vitals Group     BP 07/23/18 1330 139/70     Pulse Rate 07/23/18 1308 87     Resp 07/23/18 1308 18     Temp 07/23/18 1308 97.6 F (36.4 C)     Temp Source 07/23/18 1308 Oral     SpO2 07/23/18 1308 95 %     Weight 07/23/18 1824 195 lb (88.5 kg)     Height 07/23/18 1824 5' 9.5" (1.765 m)     Head Circumference --      Peak Flow --      Pain Score 07/23/18 1313 0     Pain Loc --      Pain Edu? --      Excl. in [place]? --   TMAX(24)@       Latest  Blood pressure 136/83, pulse 76, temperature 97.6 F (36.4 C), temperature source Oral, resp. rate 19, height 5' 9.5" (1.765 m), weight 88.5 kg, SpO2 95 %.      Hospitalist was called for admission for pulmonary embolism   Review of Systems:    Pertinent positives include: shortness of breath at rest.   dyspnea on exertion  Constitutional:  No weight loss, night sweats, Fevers, chills, fatigue, weight loss  HEENT:  No headaches, Difficulty swallowing,Tooth/dental problems,Sore throat,  No sneezing, itching, ear ache, nasal congestion, post nasal drip,  Cardio-vascular:  No chest pain, Orthopnea, PND, anasarca, dizziness, palpitations.no Bilateral lower extremity swelling  GI:  No heartburn, indigestion, abdominal pain, nausea, vomiting, diarrhea, change in bowel habits, loss of appetite, melena, blood in stool, hematemesis Resp:  no , No excess mucus, no productive cough, No non-productive cough, No coughing up of blood.No change in color of mucus.No wheezing. Skin:  no rash or lesions. No jaundice GU:  no dysuria, change in color of urine, no urgency or frequency. No straining to urinate.  No flank pain.  Musculoskeletal:  No joint pain or no joint swelling. No decreased range of motion. No back pain.  Psych:  No change in mood or affect. No depression or anxiety. No memory loss.  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   Age-related bone loss    Erectile dysfunction    GERD (gastroesophageal reflux disease)    Hyperkalemia    Hyperlipidemia    Hypertension    Other dietary vitamin B12 deficiency anemia    Prostate cancer ([place])    Urinary tract infection    Vitamin D deficiency       Past Surgical History:  Procedure Laterality Date   CHEST TUBE INSERTION Left 08/06/2016   Procedure: INSERTION PLEURAL DRAINAGE CATHETER, left;   Surgeon: [name], MD;  Location: [place];  Service: Thoracic;  Laterality: Left;   PLEURAL BIOPSY Left 08/06/2016   Procedure: PLEURAL BIOPSY;  Surgeon: [name], MD;  Location: [place];  Service: Thoracic;  Laterality: Left;   REMOVAL OF PLEURAL DRAINAGE CATHETER Left 08/28/2016   Procedure: REMOVAL OF PLEURAL DRAINAGE CATHETER;  Surgeon: [name], MD;  Location: [place];  Service: Thoracic;  Laterality: Left;   TALC PLEURODESIS Left 08/06/2016   Procedure: Pietro Cassis;  Surgeon: [name], MD;  Location: [place];  Service: Thoracic;  Laterality: Left;   VIDEO ASSISTED THORACOSCOPY Left 08/06/2016   Procedure: VIDEO ASSISTED THORACOSCOPY, left;  Surgeon: [name], MD;  Location: [place];  Service: Thoracic;  Laterality: Left;   VIDEO BRONCHOSCOPY N/A 08/06/2016   Procedure: VIDEO BRONCHOSCOPY;  Surgeon: [name], MD;  Location: [place];  Service: Thoracic;  Laterality: N/A;    Social History:  Ambulatory  Independently      reports that he quit smoking about 34 years ago. His smoking use included cigarettes. He has never used smokeless tobacco. He reports current alcohol use. He reports current drug use. Drug: Marijuana.     Family History:   Family History  Problem Relation Age of Onset   Prostate cancer Father    Prostate cancer Brother    Diabetes Other    CAD Neg Hx    Stroke Neg Hx     Allergies: Allergies  Allergen Reactions   No Known Allergies      Prior to Admission medications   Medication Sig Start Date End Date Taking? Authorizing Provider  amLODipine-benazepril (LOTREL) 5-20 MG capsule Take 1 capsule by mouth daily.   Yes [provider]  aspirin EC 81 MG tablet Take 81 mg by mouth daily.   Yes [provider]  atorvastatin (LIPITOR) 40 MG tablet Take 40 mg by mouth daily.   Yes [provider]  EUTHYROX 75 MCG tablet Take 75 mcg by mouth daily before breakfast.  06/29/18  Yes  [provider]  lactose free nutrition (BOOST) LIQD Take 237 mLs by mouth 2 (two) times daily as needed (nutrition).    Yes [provider]  predniSONE (DELTASONE) 5 MG tablet Take 5 mg by mouth daily with breakfast.  07/15/18  Yes [provider]  bisacodyl (DULCOLAX) 10 MG suppository Place 1 suppository (10 mg total) rectally daily as needed for moderate constipation. Patient not taking: Reported on 07/23/2018 08/10/16   Aline August, MD  ondansetron (ZOFRAN) 4 MG tablet Take 1 tablet (4 mg total) by mouth every 6 (six) hours as needed for nausea. Patient not taking: Reported on 07/23/2018 08/10/16   Aline August, MD  polyethylene glycol (MIRALAX / GLYCOLAX) packet Take 17 g by mouth daily. Patient taking differently: Take 17 g by mouth daily as needed (for constipation).  08/10/16   Aline August, MD   Physical Exam:  Blood pressure 136/83, pulse 76, temperature 97.6 F (36.4 C), temperature source Oral, resp. rate 19, height 5' 9.5" (1.765 m), weight 88.5 kg, SpO2 95 %. 1. General:  in No  Acute distress   Chronically ill -appearing 2. Psychological: Alert and  Oriented 3. Head/ENT:    Dry Mucous Membranes                          Head Non traumatic, neck supple                            Poor Dentition 4. SKIN:  decreased Skin turgor,  Skin clean Dry and intact no rash 5. Heart: Regular rate and rhythm no Murmur, no Rub or gallop 6. Lungs: no wheezes or crackles   7. Abdomen: Soft,  non-tender, Non distended  bowel sounds present 8. Lower extremities: no clubbing, cyanosis, no  edema 9. Neurologically Grossly intact, moving all 4 extremities equally   10. MSK: Normal range of motion   All other LABS:     Recent Labs  Lab 07/23/18 1257  WBC 13.5*  NEUTROABS 10.9*  HGB 12.2*  HCT 38.1*  MCV 101.3*  PLT 155     Recent Labs  Lab 07/23/18 1257  NA 137  K 3.8  CL 102  CO2 25  GLUCOSE 91  BUN 24*  CREATININE 1.39*  CALCIUM 9.0     Recent  Labs  Lab 07/23/18 1257  AST 15  ALT 13  ALKPHOS 71  BILITOT 0.7  PROT 7.6  ALBUMIN 4.2       Cultures:    Component Value Date/Time   SDES PLEURAL LEFT 08/06/2016 1633   SPECREQUEST PT ON ZINACEF 08/06/2016 1633   CULT No growth aerobically or anaerobically. 08/06/2016 1633   REPTSTATUS 08/11/2016 FINAL 08/06/2016 1633     Radiological Exams on Admission: Dg Chest 2 View  Result Date: 07/23/2018 CLINICAL DATA:  History of lung cancer.  Shortness of breath. EXAM: CHEST - 2 VIEW COMPARISON:  December 18, 2016 FINDINGS: There is increased nodularity in the lateral left chest compared to the previous study. Left pleural fluid and underlying opacity seen in the base. The right lung is clear. The left hilum is more prominent the interval. Nodularity on the lateral view. No other changes. IMPRESSION: Nodularity on the lateral view, laterally in the left chest on the frontal view, and increased fullness in the left hilum. The findings are concerning for recurrence. Recommend CT imaging for better evaluation. Effusion and underlying opacity, likely atelectasis or scar, in the left base. Electronically Signed   By: Dorise Bullion III M.D   On: 07/23/2018 15:51   Ct Angio Chest Pe W And/or Wo Contrast  Result Date: 07/23/2018 CLINICAL DATA:  Shortness of breath. EXAM: CT ANGIOGRAPHY CHEST WITH CONTRAST TECHNIQUE: Multidetector CT imaging of the chest was performed using the standard protocol during bolus administration of intravenous contrast. Multiplanar CT image reconstructions and MIPs were obtained to evaluate the vascular anatomy. CONTRAST:  1104mL OMNIPAQUE IOHEXOL 350 MG/ML SOLN COMPARISON:  CT chest dated August 02, 2016 FINDINGS: Cardiovascular: There is an acute pulmonary embolus involving the main right pulmonary artery. Emboli are seen extending into the right lower, right middle and right upper pulmonary arteries. Evaluation is somewhat limited by motion artifact. There is CT evidence of  right heart strain with an RV/LV ratio measuring approximately 1.3. There are coronary artery calcifications.  The heart size is normal. There is no significant pericardial effusion. Mediastinum/Nodes: There are multiple pathologically enlarged mediastinal lymph nodes measuring up to approximately 3.7 cm (axial series 4, image 45). There are no pathologically enlarged axillary or supraclavicular lymph nodes. The thyroid gland is unremarkable. Lungs/Pleura: There is a trace left-sided pleural effusion. There is a left upper lobe subpleural pulmonary nodule measuring approximately 2 cm (axial series 6, image 73). There are additional scattered areas of pleural thickening involving the left upper lobe. There is atelectasis at the left lung base. There is significant elevation of the left hemidiaphragm. 2 Upper Abdomen: There is a very abnormal appearance of the partially visualized right kidney. There is a partially visualized presumed right renal mass measuring approximately 7.3 cm. There is a 3.7 cm mass centered within the left anterior hemidiaphragm (axial series 4, image 83). Musculoskeletal: No chest wall abnormality. No acute or significant osseous findings. Review of the MIP images confirms the above findings. IMPRESSION: 1. Positive for acute pulmonary embolus essentially within the main right pulmonary artery with extension into the right middle lobe and right lower lobe pulmonary arteries. Positive for acute PE with CT evidence of right heart strain (RV/LV Ratio = 1.3) consistent with at least submassive (intermediate risk) PE. The presence of right heart strain has been associated with an increased risk of morbidity and mortality. 2. Worsening metastatic disease as evidenced by multiple pathologically enlarged mediastinal lymph nodes as detailed above in addition to multiple enlarging subpleural pulmonary nodules on the left and a metastatic deposit that appears to be centered within or adjacent to the left  hemidiaphragm. 3. Large partially visualized right renal mass. 4. Elevation of the left hemidiaphragm. There is a trace left-sided pleural effusion with adjacent atelectasis. These results were called by telephone at the time of interpretation on 07/23/2018 at 5:46 pm to Dr. Davonna Belling , who verbally acknowledged these results. Electronically Signed   By: Constance Holster M.D.   On: 07/23/2018 17:48    Chart has been reviewed    Assessment/Plan  78 y.o. 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, Pan-hypopitutarism     Admitted for  Pulmonary embolism  Present on Admission:  Pulmonary embolism ([place]) -  Admit to   telemetry   Initiate heparin drip  Would likely benefit from case manager consult for long term anticoagulation Hold home blood pressure medications avoid hypotension Cycle cardiac enzymes Order echogram and lower extremity Dopplers  Most likely risk factors for hypercoagulable state being   malignancy       Hyperlipidemia -   stable continue home medications    Essential hypertension - hold home medications given PE and AKI   Renal cell cancer, right (HCC)-[facility] at [facility] currently have change in immunotherapy given failure of a previous treatment and progression have not started new therapy yet   CAD (coronary artery disease)-stable continue home medications  Dehydration we will gently rehydrate and follow kidney status  AKI (acute kidney injury) ([place]) most likely secondary to dehydration we will rehydrate and follow  Hypopituitarism we 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  DVT prophylaxis:  Heparin  Code Status:  FULL CODE   as per patient  I had personally discussed CODE STATUS with patient    Family Communication:   Family not at  Bedside    Disposition Plan:       To home once workup is complete and patient is stable                      Consults called: none  Admission status:  ED Disposition    ED Disposition Condition [place]: [place] [[id number]]  Level of Care: Telemetry [5]  Admit to tele based on following criteria: Other see comments  Comments: pulmonary embolus  Covid Evaluation: N/A  Diagnosis: Pulmonary embolism ([place]) [067703]  Admitting Physician: Toy Baker [[id number]]  Attending Physician: Toy Baker [[id number]]  PT Class (Do Not Modify): Observation [104]  PT Acc Code (Do Not Modify): Observation [10022]      Obs    Level of care    tele  For 24H     Precautions:  Droplet,   Droplet precaution  PPE: Used by the provider:   P100  eye Goggles,  Gloves    Damarion Mendizabal [place] 07/23/2018, 8:04 PM    [facility]     after 2 AM please page floor coverage PA If 7AM-7PM, please contact the day team taking care of the patient using [email or web address]

## 2018-07-23 NOTE — ED Triage Notes (Signed)
Patient is from home and transported via [facility]. Patient has a history of lung cancer. He is short of breath on excertion while attempting to get paper towels to clean up from his dog urinating on the floor. EMS reports when the fire department arrived, his oxygen saturation was 92%, he was placed on a non-rebreather. However, EMS reports his oxygen level was 95% on room air.

## 2018-07-23 NOTE — ED Notes (Signed)
Bed: WA02 Expected date:  Expected time:  Means of arrival:  Comments: EMS: [facility], [facility]

## 2018-07-23 NOTE — ED Notes (Signed)
Rainbow tubes sent to lab.

## 2018-07-23 NOTE — ED Notes (Signed)
Spoke to CT. CT is aware that patient is ready.

## 2018-07-23 NOTE — ED Provider Notes (Signed)
[facility] DEPT Provider Note   CSN: [id number] Arrival date & time: 07/23/18  1257    History   Chief Complaint Chief Complaint  Patient presents with   Shortness of Breath    HPI Jeremy Chan is a 78 y.o. male.     HPI Patient presents with shortness of breath.  Began earlier today.  History of metastatic renal cancer.  Had been doing well on immunotherapy but recently it had not been working anymore and he is going to be switching to a different therapy, however he has not started this therapy yet.  States he was doing well yesterday but has had somewhat decreased appetite.  States that no cough.  No fevers.  No chest pain.  Recent CT scan did show worsening chest findings.  No swelling in his legs.  States his daughter is a Arts development officer and thinks he could be dehydrated. 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   Pneumothorax on left 08/07/2016   Postoperative anemia due to acute blood loss 08/07/2016   Chest tube in place    CAP (community acquired pneumonia) 08/03/2016   S/P thoracentesis    Chest pain 08/01/2016   Pleural effusion 07/31/2016   Hyperlipidemia 07/31/2016   Essential hypertension 07/31/2016    Past Surgical History:  Procedure Laterality Date   CHEST TUBE INSERTION Left 08/06/2016   Procedure: INSERTION PLEURAL DRAINAGE CATHETER, left;  Surgeon: [name], MD;  Location: [facility];  Service: Thoracic;  Laterality: Left;   PLEURAL BIOPSY Left  08/06/2016   Procedure: PLEURAL BIOPSY;  Surgeon: [name], MD;  Location: [facility];  Service: Thoracic;  Laterality: Left;   REMOVAL OF PLEURAL DRAINAGE CATHETER Left 08/28/2016   Procedure: REMOVAL OF PLEURAL DRAINAGE CATHETER;  Surgeon: [name], MD;  Location: [facility];  Service: Thoracic;  Laterality: Left;   TALC PLEURODESIS Left 08/06/2016   Procedure: Pietro Cassis;  Surgeon: [name], MD;  Location: [facility];  Service: Thoracic;  Laterality: Left;   VIDEO ASSISTED THORACOSCOPY Left 08/06/2016   Procedure: VIDEO ASSISTED THORACOSCOPY, left;  Surgeon: [name], MD;  Location: [facility];  Service: Thoracic;  Laterality: Left;   VIDEO BRONCHOSCOPY N/A 08/06/2016   Procedure: VIDEO BRONCHOSCOPY;  Surgeon: [name], MD;  Location[facility];  Service: Thoracic;  Laterality: N/A;        Home Medications    Prior to Admission medications   Medication Sig Start Date End Date Taking? Authorizing Provider  amLODipine-benazepril (LOTREL) 5-20 MG capsule Take 1 capsule by mouth daily.   Yes [provider]  aspirin EC 81 MG tablet Take 81 mg by mouth daily.   Yes [provider]  atorvastatin (LIPITOR) 40 MG tablet Take 40 mg by mouth daily.   Yes [provider]  EUTHYROX 75 MCG tablet Take 75 mcg by mouth daily before breakfast.  06/29/18  Yes [provider]  lactose free nutrition (  BOOST) LIQD Take 237 mLs by mouth 2 (two) times daily as needed (nutrition).    Yes [provider]  predniSONE (DELTASONE) 5 MG tablet Take 5 mg by mouth daily with breakfast.  07/15/18  Yes [provider]  bisacodyl (DULCOLAX) 10 MG suppository Place 1 suppository (10 mg total) rectally daily as needed for moderate constipation. Patient not taking: Reported on 07/23/2018 08/10/16   Aline August, MD  ondansetron (ZOFRAN) 4 MG tablet Take 1 tablet (4 mg total) by mouth every 6 (six) hours as needed for nausea. Patient not taking:  Reported on 07/23/2018 08/10/16   Aline August, MD  polyethylene glycol (MIRALAX / GLYCOLAX) packet Take 17 g by mouth daily. Patient taking differently: Take 17 g by mouth daily as needed (for constipation).  08/10/16   Aline August, MD    Family History Family History  Problem Relation Age of Onset   Prostate cancer Father    Prostate cancer Brother    Diabetes Other    CAD Neg Hx    Stroke Neg Hx     Social History Social History   Tobacco Use   Smoking status: Former Smoker    Types: Cigarettes    Quit date: 1986    Years since quitting: 34.4   Smokeless tobacco: Never Used  Substance Use Topics   Alcohol use: Yes   Drug use: Yes    Types: Marijuana     Allergies   No known allergies   Review of Systems Review of Systems  Constitutional: Positive for appetite change and fatigue. Negative for fever.  HENT: Negative for congestion.   Respiratory: Positive for shortness of breath.   Cardiovascular: Negative for chest pain and leg swelling.  Gastrointestinal: Negative for abdominal pain.  Genitourinary: Negative for flank pain.  Musculoskeletal: Negative for back pain.  Neurological: Negative for weakness.  Psychiatric/Behavioral: Negative for confusion.     Physical Exam Updated Vital Signs BP 138/77    Pulse 77    Temp 97.6 F (36.4 C) (Oral)    Resp 20    SpO2 95%   Physical Exam Vitals signs and nursing note reviewed.  Neck:     Musculoskeletal: Neck supple.  Cardiovascular:     Rate and Rhythm: Regular rhythm.  Pulmonary:     Effort: No respiratory distress.     Breath sounds: No wheezing, rhonchi or rales.  Chest:     Chest wall: No tenderness.  Abdominal:     Tenderness: There is no guarding or rebound.  Musculoskeletal:     Right lower leg: He exhibits no tenderness. No edema.     Left lower leg: He exhibits no tenderness. No edema.  Skin:    General: Skin is warm.     Capillary Refill: Capillary refill takes less than 2 seconds.   Neurological:     General: No focal deficit present.     Mental Status: He is alert.      ED Treatments / Results  Labs (all labs ordered are listed, but only abnormal results are displayed) Labs Reviewed  COMPREHENSIVE METABOLIC PANEL - Abnormal; Notable for the following components:      Result Value   BUN 24 (*)    Creatinine, Ser 1.39 (*)    GFR calc non Af Amer 48 (*)    GFR calc Af Amer 55 (*)    All other components within normal limits  CBC WITH DIFFERENTIAL/PLATELET - Abnormal; Notable for the following components:   WBC 13.5 (*)  RBC 3.76 (*)    Hemoglobin 12.2 (*)    HCT 38.1 (*)    MCV 101.3 (*)    Neutro Abs 10.9 (*)    Monocytes Absolute 1.3 (*)    Abs Immature Granulocytes 0.08 (*)    All other components within normal limits  SARS CORONAVIRUS 2 (HOSPITAL ORDER, [facility])    EKG EKG Interpretation  Date/Time:  Wednesday July 23 2018 13:10:51 EDT Ventricular Rate:  84 PR Interval:    QRS Duration: 97 QT Interval:  393 QTC Calculation: 465 R Axis:   59 Text Interpretation:  Sinus rhythm Confirmed by Francine Graven [phone]), editor Philomena Doheny [phone]) on 07/23/2018 1:37:44 PM   Radiology Dg Chest 2 View  Result Date: 07/23/2018 CLINICAL DATA:  History of lung cancer.  Shortness of breath. EXAM: CHEST - 2 VIEW COMPARISON:  December 18, 2016 FINDINGS: There is increased nodularity in the lateral left chest compared to the previous study. Left pleural fluid and underlying opacity seen in the base. The right lung is clear. The left hilum is more prominent the interval. Nodularity on the lateral view. No other changes. IMPRESSION: Nodularity on the lateral view, laterally in the left chest on the frontal view, and increased fullness in the left hilum. The findings are concerning for recurrence. Recommend CT imaging for better evaluation. Effusion and underlying opacity, likely atelectasis or scar, in the left base.  Electronically Signed   By: Dorise Bullion III M.D   On: 07/23/2018 15:51   Ct Angio Chest Pe W And/or Wo Contrast  Result Date: 07/23/2018 CLINICAL DATA:  Shortness of breath. EXAM: CT ANGIOGRAPHY CHEST WITH CONTRAST TECHNIQUE: Multidetector CT imaging of the chest was performed using the standard protocol during bolus administration of intravenous contrast. Multiplanar CT image reconstructions and MIPs were obtained to evaluate the vascular anatomy. CONTRAST:  160mL OMNIPAQUE IOHEXOL 350 MG/ML SOLN COMPARISON:  CT chest dated August 02, 2016 FINDINGS: Cardiovascular: There is an acute pulmonary embolus involving the main right pulmonary artery. Emboli are seen extending into the right lower, right middle and right upper pulmonary arteries. Evaluation is somewhat limited by motion artifact. There is CT evidence of right heart strain with an RV/LV ratio measuring approximately 1.3. There are coronary artery calcifications. The heart size is normal. There is no significant pericardial effusion. Mediastinum/Nodes: There are multiple pathologically enlarged mediastinal lymph nodes measuring up to approximately 3.7 cm (axial series 4, image 45). There are no pathologically enlarged axillary or supraclavicular lymph nodes. The thyroid gland is unremarkable. Lungs/Pleura: There is a trace left-sided pleural effusion. There is a left upper lobe subpleural pulmonary nodule measuring approximately 2 cm (axial series 6, image 73). There are additional scattered areas of pleural thickening involving the left upper lobe. There is atelectasis at the left lung base. There is significant elevation of the left hemidiaphragm. 2 Upper Abdomen: There is a very abnormal appearance of the partially visualized right kidney. There is a partially visualized presumed right renal mass measuring approximately 7.3 cm. There is a 3.7 cm mass centered within the left anterior hemidiaphragm (axial series 4, image 83). Musculoskeletal: No chest  wall abnormality. No acute or significant osseous findings. Review of the MIP images confirms the above findings. IMPRESSION: 1. Positive for acute pulmonary embolus essentially within the main right pulmonary artery with extension into the right middle lobe and right lower lobe pulmonary arteries. Positive for acute PE with CT evidence of right heart strain (RV/LV Ratio = 1.3)  consistent with at least submassive (intermediate risk) PE. The presence of right heart strain has been associated with an increased risk of morbidity and mortality. 2. Worsening metastatic disease as evidenced by multiple pathologically enlarged mediastinal lymph nodes as detailed above in addition to multiple enlarging subpleural pulmonary nodules on the left and a metastatic deposit that appears to be centered within or adjacent to the left hemidiaphragm. 3. Large partially visualized right renal mass. 4. Elevation of the left hemidiaphragm. There is a trace left-sided pleural effusion with adjacent atelectasis. These results were called by telephone at the time of interpretation on 07/23/2018 at 5:46 pm to Dr. Davonna Belling , who verbally acknowledged these results. Electronically Signed   By: Constance Holster M.D.   On: 07/23/2018 17:48    Procedures Procedures (including critical care time)  Medications Ordered in ED Medications  sodium chloride (PF) 0.9 % injection (has no administration in time range)  sodium chloride 0.9 % bolus 1,000 mL (0 mLs Intravenous Stopped 07/23/18 1544)  iohexol (OMNIPAQUE) 350 MG/ML injection 100 mL (100 mLs Intravenous Contrast Given 07/23/18 1714)     Initial Impression / Assessment and Plan / ED Course  I have reviewed the triage vital signs and the nursing notes.  Pertinent labs & imaging results that were available during my care of the patient were reviewed by me and considered in my medical decision making (see chart for details).        Patient with shortness of breath.   Acute onset earlier today.  Has pulmonary embolism on CT scan.  Does have known metastatic cancer.  No bleeding history.  Not hypoxic but is dyspneic.  Will admit to hospitalist for further treatment  Final Clinical Impressions(s) / ED Diagnoses   Final diagnoses:  Acute pulmonary embolism without acute cor pulmonale, unspecified pulmonary embolism type [facility])    ED Discharge Orders    None       Davonna Belling, MD 07/23/18 [phone]

## 2018-07-23 NOTE — Progress Notes (Signed)
ANTICOAGULATION CONSULT NOTE - Initial Consult  Pharmacy Consult for heparin drip Indication: pulmonary embolus  Allergies  Allergen Reactions  . No Known Allergies     Patient Measurements:   Height: 176.5 cm Weight: 88.5 kg Heparin Dosing Weight = TBW = 88.5 kg  Vital Signs: Temp: 97.6 F (36.4 C) (06/17 1308) Temp Source: Oral (06/17 1308) BP: 138/77 (06/17 1700) Pulse Rate: 78 (06/17 1804)  Labs: Recent Labs    07/23/18 1257  HGB 12.2*  HCT 38.1*  PLT 155  CREATININE 1.39*    CrCl = 48 mL/min  Medical History: Past Medical History:  Diagnosis Date  . 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 Age-related bone loss   . Erectile dysfunction   . GERD (gastroesophageal reflux disease)   . Hyperkalemia   . Hyperlipidemia   . Hypertension   . Other dietary vitamin B12 deficiency anemia   . Prostate cancer ([place])   . Urinary tract infection   . Vitamin D deficiency     Assessment: Pt has PMH significant for metastatic cancer; presenting with SOB. Pt found to have PE and pharmacy consulted to dose/monitor heparin infusion. Pt was not on anticoagulants PTA. Baseline labs ordered STAT.  Today, 07/23/18  Hgb 12.2 - slightly low  Plt 155 - WNL  APTT and protime/INR ordered STAT  Goal of Therapy:  Heparin level 0.3-0.7 units/ml Monitor platelets by anticoagulation protocol: Yes   Plan:   Give 2400 units bolus x 1  Start heparin infusion at 1350 units/hr  Check anti-Xa level in 8 hours and daily while on heparin  Continue to monitor H&H and platelets  Monitor for signs/symptoms of bleeding  Follow for plan to change to oral anticoagulation  Lenis Noon, PharmD 07/23/2018,6:10 PM

## 2018-07-24 ENCOUNTER — Observation Stay (HOSPITAL_COMMUNITY): Payer: Medicare Other

## 2018-07-24 ENCOUNTER — Other Ambulatory Visit: Payer: Self-pay

## 2018-07-24 DIAGNOSIS — I2699 Other pulmonary embolism without acute cor pulmonale: Secondary | ICD-10-CM | POA: Diagnosis present

## 2018-07-24 DIAGNOSIS — E872 Acidosis: Secondary | ICD-10-CM | POA: Diagnosis not present

## 2018-07-24 DIAGNOSIS — D509 Iron deficiency anemia, unspecified: Secondary | ICD-10-CM | POA: Diagnosis present

## 2018-07-24 DIAGNOSIS — D6869 Other thrombophilia: Secondary | ICD-10-CM | POA: Diagnosis present

## 2018-07-24 DIAGNOSIS — C641 Malignant neoplasm of right kidney, except renal pelvis: Secondary | ICD-10-CM | POA: Diagnosis present

## 2018-07-24 DIAGNOSIS — C3492 Malignant neoplasm of unspecified part of left bronchus or lung: Secondary | ICD-10-CM | POA: Diagnosis present

## 2018-07-24 DIAGNOSIS — R0602 Shortness of breath: Secondary | ICD-10-CM | POA: Diagnosis present

## 2018-07-24 DIAGNOSIS — E875 Hyperkalemia: Secondary | ICD-10-CM | POA: Diagnosis present

## 2018-07-24 DIAGNOSIS — K219 Gastro-esophageal reflux disease without esophagitis: Secondary | ICD-10-CM | POA: Diagnosis present

## 2018-07-24 DIAGNOSIS — Z1159 Encounter for screening for other viral diseases: Secondary | ICD-10-CM | POA: Diagnosis not present

## 2018-07-24 DIAGNOSIS — J40 Bronchitis, not specified as acute or chronic: Secondary | ICD-10-CM | POA: Diagnosis not present

## 2018-07-24 DIAGNOSIS — E860 Dehydration: Secondary | ICD-10-CM | POA: Diagnosis not present

## 2018-07-24 DIAGNOSIS — I119 Hypertensive heart disease without heart failure: Secondary | ICD-10-CM | POA: Diagnosis present

## 2018-07-24 DIAGNOSIS — C7951 Secondary malignant neoplasm of bone: Secondary | ICD-10-CM | POA: Diagnosis present

## 2018-07-24 DIAGNOSIS — J9601 Acute respiratory failure with hypoxia: Secondary | ICD-10-CM | POA: Diagnosis present

## 2018-07-24 DIAGNOSIS — I2510 Atherosclerotic heart disease of native coronary artery without angina pectoris: Secondary | ICD-10-CM | POA: Diagnosis not present

## 2018-07-24 DIAGNOSIS — E230 Hypopituitarism: Secondary | ICD-10-CM | POA: Diagnosis present

## 2018-07-24 DIAGNOSIS — D539 Nutritional anemia, unspecified: Secondary | ICD-10-CM | POA: Diagnosis present

## 2018-07-24 DIAGNOSIS — N179 Acute kidney failure, unspecified: Secondary | ICD-10-CM | POA: Diagnosis present

## 2018-07-24 DIAGNOSIS — E785 Hyperlipidemia, unspecified: Secondary | ICD-10-CM | POA: Diagnosis present

## 2018-07-24 DIAGNOSIS — E039 Hypothyroidism, unspecified: Secondary | ICD-10-CM | POA: Diagnosis present

## 2018-07-24 DIAGNOSIS — E7800 Pure hypercholesterolemia, unspecified: Secondary | ICD-10-CM | POA: Diagnosis present

## 2018-07-24 DIAGNOSIS — E871 Hypo-osmolality and hyponatremia: Secondary | ICD-10-CM | POA: Diagnosis not present

## 2018-07-24 DIAGNOSIS — C779 Secondary and unspecified malignant neoplasm of lymph node, unspecified: Secondary | ICD-10-CM | POA: Diagnosis present

## 2018-07-24 LAB — COMPREHENSIVE METABOLIC PANEL
ALT: 11 U/L (ref 0–44)
AST: 13 U/L — ABNORMAL LOW (ref 15–41)
Albumin: 3.6 g/dL (ref 3.5–5.0)
Alkaline Phosphatase: 64 U/L (ref 38–126)
Anion gap: 11 (ref 5–15)
BUN: 20 mg/dL (ref 8–23)
CO2: 22 mmol/L (ref 22–32)
Calcium: 8.5 mg/dL — ABNORMAL LOW (ref 8.9–10.3)
Chloride: 103 mmol/L (ref 98–111)
Creatinine, Ser: 1.2 mg/dL (ref 0.61–1.24)
GFR calc Af Amer: >60 mL/min (ref >60)
GFR calc non Af Amer: 57 mL/min — ABNORMAL LOW (ref >60)
Glucose, Bld: 94 mg/dL (ref 70–99)
Potassium: 4 mmol/L (ref 3.5–5.1)
Sodium: 136 mmol/L (ref 135–145)
Total Bilirubin: 0.6 mg/dL (ref 0.3–1.2)
Total Protein: 6.4 g/dL — ABNORMAL LOW (ref 6.5–8.1)

## 2018-07-24 LAB — CBC
HCT: 37.7 % — ABNORMAL LOW (ref 39.0–52.0)
Hemoglobin: 12.3 g/dL — ABNORMAL LOW (ref 13.0–17.0)
MCH: 33.1 pg (ref 26.0–34.0)
MCHC: 32.6 g/dL (ref 30.0–36.0)
MCV: 101.3 fL — ABNORMAL HIGH (ref 80.0–100.0)
Platelets: 145 10*3/uL — ABNORMAL LOW (ref 150–400)
RBC: 3.72 MIL/uL — ABNORMAL LOW (ref 4.22–5.81)
RDW: 13.8 % (ref 11.5–15.5)
WBC: 10.6 10*3/uL — ABNORMAL HIGH (ref 4.0–10.5)
nRBC: 0 % (ref 0.0–0.2)

## 2018-07-24 LAB — TROPONIN I
Troponin I: <0.03 ng/mL (ref <0.03)
Troponin I: <0.03 ng/mL (ref <0.03)

## 2018-07-24 LAB — HEPARIN LEVEL (UNFRACTIONATED)
Heparin Unfractionated: 0.73 IU/mL — ABNORMAL HIGH (ref 0.30–0.70)
Heparin Unfractionated: 0.81 IU/mL — ABNORMAL HIGH (ref 0.30–0.70)
Heparin Unfractionated: 0.46 IU/mL (ref 0.30–0.70)

## 2018-07-24 LAB — ECHOCARDIOGRAM COMPLETE
Height: 69.5 in
Weight: 3120 oz

## 2018-07-24 LAB — TSH: TSH: 1.484 u[IU]/mL (ref 0.350–4.500)

## 2018-07-24 LAB — PHOSPHORUS: Phosphorus: 2.6 mg/dL (ref 2.5–4.6)

## 2018-07-24 LAB — MAGNESIUM: Magnesium: 2.3 mg/dL (ref 1.7–2.4)

## 2018-07-24 MED ORDER — ASPIRIN EC 81 MG PO TBEC
81.0000 mg | DELAYED_RELEASE_TABLET | Freq: Every day | ORAL | Status: DC
  Administered 2018-07-24 – 2018-08-01 (×9): 81 mg via ORAL
  Filled 2018-07-24 (×9): qty 1

## 2018-07-24 MED ORDER — SODIUM CHLORIDE 0.9 % IV SOLN
INTRAVENOUS | Status: AC
  Administered 2018-07-24: 17:00:00 via INTRAVENOUS

## 2018-07-24 NOTE — Progress Notes (Signed)
[place] for heparin drip Indication: pulmonary embolus  Allergies  Allergen Reactions  . No Known Allergies     Patient Measurements: Height: 5' 9.5" (176.5 cm) Weight: 195 lb (88.5 kg) IBW/kg (Calculated) : 71.85 Height: 176.5 cm Weight: 88.5 kg Heparin Dosing Weight = TBW = 88.5 kg  Vital Signs: Temp: 99.5 F (37.5 C) (06/18 2015) Temp Source: Oral (06/18 2015) BP: 130/73 (06/18 2015) Pulse Rate: 74 (06/18 2015)  Labs: Recent Labs    07/23/18 1257  07/23/18 1857 07/23/18 2151 07/24/18 0358 07/24/18 0949 07/24/18 1223 07/24/18 2158  HGB 12.2*  --   --   --  12.3*  --   --   --   HCT 38.1*  --   --   --  37.7*  --   --   --   PLT 155  --   --   --  145*  --   --   --   APTT  --   --  29  --   --   --   --   --   LABPROT  --   --  13.2  --   --   --   --   --   INR  --   --  1.0  --   --   --   --   --   HEPARINUNFRC  --   --   --   --  0.73*  --  0.81* 0.46  CREATININE 1.39*  --   --   --  1.20  --   --   --   TROPONINI  --    < > <0.03 <0.03 <0.03 <0.03  --   --    < > = values in this interval not displayed.    CrCl = 48 mL/min  Assessment: Pt has PMH significant for metastatic cancer; presenting with SOB. Pt found to have PE and pharmacy consulted to dose/monitor heparin infusion. Pt was not on anticoagulants PTA. Baseline labs WNL.  Today, 07/24/18  Heparin level is therapeutic at 0.46 after heparin infusion decreased to 1200 units/hr.    No bleeding or infusion related issues reported by RN.   Goal of Therapy:  Heparin level 0.3-0.7 units/ml Monitor platelets by anticoagulation protocol: Yes   Plan:   Continue heparin infusion at 1200 units/hr  Check anti-Xa level daily while on heparin  Continue to monitor H&H and platelets  Monitor for signs/symptoms of bleeding  Follow for plan to change to [place], Pharm.D 07/24/2018 11:03 PM

## 2018-07-24 NOTE — Progress Notes (Signed)
VASCULAR LAB PRELIMINARY  PRELIMINARY  PRELIMINARY  PRELIMINARY  Bilateral lower extremity venous dulex completed.    Preliminary report:  See CV proc for preliminary results.   Ashvik Grundman, RVT 07/24/2018, 9:50 AM

## 2018-07-24 NOTE — Progress Notes (Signed)
PROGRESS NOTE    Jeremy Chan  ZYS:[id number] DOB: Mar 17, 1940 DOA: 07/23/2018 PCP: Haze Rushing, MD   Brief Narrative:  HPI per [name] on 07/23/2018 Jeremy Chan is a 78 y.o. 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, Pan-hypopitutarism    Presented with less of breath worse than his baseline and worse with exertion he got suddenly very short of breath today when he was trying to clean off the urine on the floor when his dog had an accident.  When EMS arrived his oxygen saturation was down to 92% he was placed on nonrebreather secondary to increased work of breathing although by that time his oxygen saturation was 95% room air He has had decreased appetite and fatigue which is been chronic since his diagnosis of cancer having had significant coughing no fevers no chest pain he denies any leg swelling   Recently seen by his oncologist CT 07/16/18 showed enlargement of a right renal neoplasm w/d interval extension into the R renal vein and IVC, enlargement of multiple necrotic mediastinal lymph node conglomerates, and increased nodularity chest and abdomen. Stable multiple sclerotic and lytic foci of osseous metastatic dise.  Plan is to hold immunotherapy w/ switch to cabozantinib   recent CT chest/abd/pelvis from 07/16/2018  Showed  progression of disease including enlargement of a right renal neoplasm with interval extension into the right renal vein and inferior vena cava, enlargement of multiple necrotic mediastinal lymph node conglomerates, and enlarging nodularity involving  the chest and abdomen   **Interim History Patient remains on a heparin drip and case was discussed with oncology who recommends full dose anticoagulation with Lovenox at discharge.  Echocardiogram was done and not show any right heart strain and no evidence of any right systolic dysfunction and lower extremity duplex was done and was negative for VTE.  Patient remained on a heparin drip today and will likely transition from heparin drip after 48 hours to full dose Lovenox  Assessment & Plan:   Principal Problem:   Pulmonary embolism (HCC) Active Problems:   Hyperlipidemia   Essential hypertension   Renal cell cancer, right (HCC)   CAD (coronary artery disease)   Dehydration   AKI (acute kidney injury) ([place])   Hypothyroidism   Hypopituitarism ([place])  Acute Pulmonary Embolism  -Setting of hypercoagulable state secondary to his malignancies -Admitted to inpatient telemetry and will continue telemetry monitoring -Cycle cardiac troponins were less than 0.034 -CTA of the chest showed positive acute PE essentially within the main right pulmonary artery with extension to the right middle lobe and right lower lobe pulmonary arteries.  There is CT evidence of right heart strain with RV/LV ratio of 1.3 consistent with a recent submassive PE -Started on heparin drip and will continue for now -Discussed with medical oncology Dr. who recommends transitioning to Lovenox at discharge 1 mg/kg twice daily at discharge but will continue heparin drip for at least 48 hours currently -Continue to hold home blood pressure medications and avoid hypotension -Cardiogram done and showed a normal systolic function with a EF of 55 to 60% with cavity size that was normal.  There is no increase in left ventricular wall thickness  and left ventricular diastolic parameters were consistent with impaired relaxation the right ventricle has a normal systolic function -No evidence of right heart strain or systolic dysfunction  on echocardiogram -Lower extremity Dopplers were negative for VTE -Have consulted case management for cost of full dose Lovenox 1 meg per cake -We will need home ambulatory screen prior to discharge  Acute Respiratory Failure with hypoxia secondary to above Patient was placed on 2 L of supplemental oxygen via nasal cannula -Continue pulse oximetry and maintain O2 saturation greater than 92% -Continue with supplemental oxygen via nasal cannula and wean O2 as tolerated -Repeat chest x-ray in a.m. -We will need home amatory screen prior to discharge  Hyperlipidemia  -Stable continue home medications with atorvastatin 40 g p.o. nightly  Essential Hypertension -Continue hold home medications with Amlodipine-Benazepril Combination given PE and AKI -Continued IV fluid hydration with normal saline at 75 mils per hour and will change to 50 mL/hr x10 hours and then stop   Metastatic Renal cell cancer, Right Hx of Prostate Cancer -Followed at [facility] -Renal cancer has metastasized to the lung, the bone, the pelvis as well as soft tissue; patient is currently on nivolumab -Also had a history of laryngeal cancer that involved bilateral vocal cord status post radiation and history of prostate cancers treated with brachytherapy implantations over 2000 -Currently have change in immunotherapy given failure of a previous treatment and progression have not started new therapy yet -CTA showed worsening metastatic disease as evidenced by multiple pathologically enlarged mediastinal lymph nodes in addition to enlarging sub-pleural pulmonary nodules on the left and metastatic deposit that appears to be centered within or adjacent to the left hemidiaphragm  CAD  with history of stents in the early 2000 -Stable continue home medications with ASA 81 mg po Daily and Atorvastatin 40 mg po Daily   Dehydration  -we will gently rehydrate and follow kidney status -BUN/Cr is improving slightly and went  from 24/1.39 -> 20/1.20  AKI (acute kidney injury) ([place]) most likely secondary to dehydration -Will gently Rehydrate with NS at 75 mL/hr x 10 hours and then 50 mL/hr x10 hours  -BUN/Cr went from 24/1.39 -> 20/1.20  -Continue to Monitor and follow Renal Fxn -Avoid Nephrotoxic Medications if possible   Pan-hypopituitarism Hypothyroidism   -Continue Home Prednisone and Synthroid -Patient sees Dr. Herschel [place] in endocrinology and also receives testosterone every 2 weeks  Macrocytic Anemia -Patient's Hb/Hct went from 12.2/38.1 -> 12.3/37.7 -Check Anemia Panel in the AM -Continue to Monitor for S/Sx of Bleeding as patient is anticoagulated with Heparin gtt; Currently no overt signs of bleeding -Repeat CBC in AM   Thrombocytopenia Patient's platelet count went from 155,000 and is now 145,000 -Continue to monitor for signs and symptoms of bleeding; currently no overt bleeding noted -Continue monitor carefully as patient is on a heparin drip -Repeat CBC in a.m.  Leukocytosis -Likely reactive in the setting of PE -Trending downward as patient is WBC went from 13.5 is now 10.6 -Continue monitor for signs and symptoms of infection -Repeat CBC in a.m.  DVT prophylaxis: Anticoagulated with Heparin gtt Code Status: FULL CODE  Family Communication: No family present at bedside  Disposition Plan: Remain Inpatient for continued Workup and treatment and Anticipate D/C in the next 24-48 hours if medically stable   Consultants:   None but case was discussed with Medical Oncology [name]   Procedures:  ECHOCARDIOGRAM 07/23/2018 IMPRESSIONS    1. The left ventricle has normal systolic function, with an ejection  fraction of 55-60%. The cavity size was normal. Left ventricular diastolic Doppler parameters are consistent with impaired relaxation.  2. The right ventricle has normal systolic function. The cavity was normal. There is no increase in right ventricular wall thickness.  FINDINGS   Left Ventricle: The left ventricle has normal systolic function, with an ejection fraction of 55-60%. The cavity size was normal. There is no increase in left ventricular wall thickness. Left ventricular diastolic Doppler parameters are consistent with  impaired relaxation.  Right Ventricle: The right ventricle has normal systolic function. The cavity was normal. There is no increase in right ventricular wall thickness.  Left Atrium: Left atrial size was normal in size.  Right Atrium: Right atrial size was normal in size. Right atrial pressure is estimated at 10 mmHg.  Interatrial Septum: No atrial level shunt detected by color flow Doppler.  Pericardium: There is no evidence of pericardial effusion.  Mitral Valve: The mitral valve is normal in structure. Mitral valve regurgitation is not visualized by color flow Doppler.  Tricuspid Valve: The tricuspid valve is normal in structure. Tricuspid valve regurgitation was not visualized by color flow Doppler.  Aortic Valve: The aortic valve is normal in structure. Aortic valve regurgitation was not visualized by color flow Doppler.  Pulmonic Valve: The pulmonic valve was normal in structure. Pulmonic valve regurgitation is not visualized by color flow Doppler.  Venous: The inferior vena cava is normal in size with greater than 50% respiratory variability.    +--------------+--------++  LEFT VENTRICLE            +----------------+---------++ +--------------+--------++  Diastology                    PLAX 2D                   +----------------+---------++ +--------------+--------++  LV e' lateral:   9.63 cm/s    LVIDd:         5.15 cm    +----------------+---------++ +--------------+--------++  LV E/e' lateral: 6.4          LVIDs:         3.04 cm    +----------------+---------++ +--------------+--------++  LV e' medial:    7.94 cm/s    LV PW:         1.04 cm    +----------------+---------++ +--------------+--------++  LV E/e'  medial:  7.8          LV IVS:        0.75 cm    +----------------+---------++ +--------------+--------++  LVOT diam:     2.00 cm    +--------------+--------++  LV SV:         90 ml      +--------------+--------++  LV SV Index:   43.06      +--------------+--------++  LVOT Area:     3.14 cm   +--------------+--------++                            +--------------+--------++  +---------------+----------++  RIGHT VENTRICLE              +---------------+----------++  RV S prime:     11.20 cm/s   +---------------+----------++  +---------------+-------++-----------++  LEFT ATRIUM              Index         +---------------+-------++-----------++  LA diam:        3.50 cm  1.70 cm/m    +---------------+-------++-----------++  LA Vol (  A2C):   29.3 ml  14.26 ml/m   +---------------+-------++-----------++  LA Vol (A4C):   42.4 ml  20.64 ml/m   +---------------+-------++-----------++  LA Biplane Vol: 36.4 ml  17.72 ml/m   +---------------+-------++-----------++  +------------+-----------++  AORTIC VALVE               +------------+-----------++  LVOT Vmax:   70.90 cm/s    +------------+-----------++  LVOT Vmean:  46.900 cm/s   +------------+-----------++  LVOT VTI:    0.157 m       +------------+-----------++   +-------------+-------++  AORTA                   +-------------+-------++  Ao Root diam: 3.10 cm   +-------------+-------++  +--------------+----------++  MITRAL VALVE                +--------------+-------+ +--------------+----------++  SHUNTS                   MV Area (PHT): 2.73 cm     +--------------+-------+ +--------------+----------++  Systemic VTI:  0.16 m    MV PHT:        80.62 msec   +--------------+-------+ +--------------+----------++  Systemic Diam: 2.00 cm   MV Decel Time: 278 msec     +--------------+-------+ +--------------+----------++ +--------------+----------++  MV E velocity: 61.60 cm/s   +--------------+----------++  MV A  velocity: 75.70 cm/s   +--------------+----------++  MV E/A ratio:  0.81         +--------------+----------++   Antimicrobials:  Anti-infectives (From admission, onward)   None     Subjective: Seen and examined at bedside was feeling a little dyspneic.  No chest pain, lightheadedness or dizziness.  Still feels somewhat short of breath.  Denies any chest pain currently.  No other complaints or concerns at this time.  Objective: Vitals:   07/24/18 0532 07/24/18 1100 07/24/18 1110 07/24/18 1435  BP: 109/65 (!) 102/55  101/63  Pulse: 72 60  70  Resp: 18 18  18   Temp: 99.7 F (37.6 C) 98.1 F (36.7 C)  98.5 F (36.9 C)  TempSrc: Oral Oral  Oral  SpO2: 94% 98% 97% 98%  Weight:      Height:        Intake/Output Summary (Last 24 hours) at 07/24/2018 1507 Last data filed at 07/24/2018 1000 Gross per 24 hour  Intake 2487.1 ml  Output 250 ml  Net 2237.1 ml   Filed Weights   07/23/18 1824  Weight: 88.5 kg   Examination: Physical Exam:  Constitutional: WN/WD elderly overweight Caucasian male who is chronically ill-appearing currently in mild respiratory distress appears calm but does appear a little uncomfortable Eyes: Lids and conjunctivae normal, sclerae anicteric  ENMT: External Ears, Nose appear normal. Grossly normal hearing.  Neck: Appears normal, supple, no cervical masses, normal ROM, no appreciable thyromegaly; no JVD Respiratory: Diminished to auscultation bilaterally, no wheezing, rales, rhonchi or crackles.  Had mildly labored breathing and mild tachypnea. Cardiovascular: RRR, has a 2 out of 6 systolic murmur.  Mild lower extremity edema Abdomen: Soft, non-tender, Distended 2/2 body habitus. No masses palpated. No appreciable hepatosplenomegaly. Bowel sounds positive x4.  GU: Deferred. Musculoskeletal: No clubbing / cyanosis of digits/nails. No joint deformity upper and lower extremities.  Skin: No rashes, lesions, ulcers on a limited skin evaluation. No induration;  Warm and dry.  Neurologic: CN 2-12 grossly intact with no focal deficits.  Romberg sign and cerebellar reflexes not assessed.  Psychiatric: Normal judgment and insight. Alert and oriented x  3. Mildly anxious mood and appropriate affect.   Data Reviewed: I have personally reviewed following labs and imaging studies  CBC: Recent Labs  Lab 07/23/18 1257 07/24/18 0358  WBC 13.5* 10.6*  NEUTROABS 10.9*  --   HGB 12.2* 12.3*  HCT 38.1* 37.7*  MCV 101.3* 101.3*  PLT 155 993*   Basic Metabolic Panel: Recent Labs  Lab 07/23/18 1257 07/24/18 0358  NA 137 136  K 3.8 4.0  CL 102 103  CO2 25 22  GLUCOSE 91 94  BUN 24* 20  CREATININE 1.39* 1.20  CALCIUM 9.0 8.5*  MG  --  2.3  PHOS  --  2.6   GFR: Estimated Creatinine Clearance: 55.4 mL/min (by C-G formula based on SCr of 1.2 mg/dL). Liver Function Tests: Recent Labs  Lab 07/23/18 1257 07/24/18 0358  AST 15 13*  ALT 13 11  ALKPHOS 71 64  BILITOT 0.7 0.6  PROT 7.6 6.4*  ALBUMIN 4.2 3.6   No results for input(s): LIPASE, AMYLASE in the last 168 hours. No results for input(s): AMMONIA in the last 168 hours. Coagulation Profile: Recent Labs  Lab 07/23/18 1857  INR 1.0   Cardiac Enzymes: Recent Labs  Lab 07/23/18 1857 07/23/18 2151 07/24/18 0358 07/24/18 0949  TROPONINI <0.03 <0.03 <0.03 <0.03   BNP (last 3 results) No results for input(s): PROBNP in the last 8760 hours. HbA1C: No results for input(s): HGBA1C in the last 72 hours. CBG: No results for input(s): GLUCAP in the last 168 hours. Lipid Profile: No results for input(s): CHOL, HDL, LDLCALC, TRIG, CHOLHDL, LDLDIRECT in the last 72 hours. Thyroid Function Tests: Recent Labs    07/24/18 0358  TSH 1.484   Anemia Panel: No results for input(s): VITAMINB12, FOLATE, FERRITIN, TIBC, IRON, RETICCTPCT in the last 72 hours. Sepsis Labs: No results for input(s): PROCALCITON, LATICACIDVEN in the last 168 hours.  Recent Results (from the past 240 hour(s))    SARS Coronavirus 2 (CEPHEID - Performed in [place] hospital lab), Hosp Order     Status: None   Collection Time: 07/23/18  6:57 PM   Specimen: Nasopharyngeal Swab  Result Value Ref Range Status   SARS Coronavirus 2 NEGATIVE NEGATIVE Final    Comment: (NOTE) If result is NEGATIVE SARS-CoV-2 target nucleic acids are NOT DETECTED. The SARS-CoV-2 RNA is generally detectable in upper and lower  respiratory specimens during the acute phase of infection. The lowest  concentration of SARS-CoV-2 viral copies this assay can detect is 250  copies / mL. A negative result does not preclude SARS-CoV-2 infection  and should not be used as the sole basis for treatment or other  patient management decisions.  A negative result may occur with  improper specimen collection / handling, submission of specimen other  than nasopharyngeal swab, presence of viral mutation(s) within the  areas targeted by this assay, and inadequate number of viral copies  (<250 copies / mL). A negative result must be combined with clinical  observations, patient history, and epidemiological information. If result is POSITIVE SARS-CoV-2 target nucleic acids are DETECTED. The SARS-CoV-2 RNA is generally detectable in upper and lower  respiratory specimens dur ing the acute phase of infection.  Positive  results are indicative of active infection with SARS-CoV-2.  Clinical  correlation with patient history and other diagnostic information is  necessary to determine patient infection status.  Positive results do  not rule out bacterial infection or co-infection with other viruses. If result is PRESUMPTIVE POSTIVE SARS-CoV-2 nucleic acids MAY  BE PRESENT.   A presumptive positive result was obtained on the submitted specimen  and confirmed on repeat testing.  While 2019 novel coronavirus  (SARS-CoV-2) nucleic acids may be present in the submitted sample  additional confirmatory testing may be necessary for epidemiological   and / or clinical management purposes  to differentiate between  SARS-CoV-2 and other Sarbecovirus currently known to infect humans.  If clinically indicated additional testing with an alternate test  methodology [phone]) is advised. The SARS-CoV-2 RNA is generally  detectable in upper and lower respiratory sp ecimens during the acute  phase of infection. The expected result is Negative. Fact Sheet for Patients:  StrictlyIdeas.no Fact Sheet for Healthcare Providers: BankingDealers.co.za This test is not yet approved or cleared by the [place] FDA and has been authorized for detection and/or diagnosis of SARS-CoV-2 by FDA under an Emergency Use Authorization (EUA).  This EUA will remain in effect (meaning this test can be used) for the duration of the COVID-19 declaration under Section 564(b)(1) of the Act, 21 U.S.C. section 360bbb-3(b)(1), unless the authorization is terminated or revoked sooner. Performed at [facility], [place] [address] [place], [place] [address]       Radiology Studies: Dg Chest 2 View  Result Date: 07/23/2018 CLINICAL DATA:  History of lung cancer.  Shortness of breath. EXAM: CHEST - 2 VIEW COMPARISON:  December 18, 2016 FINDINGS: There is increased nodularity in the lateral left chest compared to the previous study. Left pleural fluid and underlying opacity seen in the base. The right lung is clear. The left hilum is more prominent the interval. Nodularity on the lateral view. No other changes. IMPRESSION: Nodularity on the lateral view, laterally in the left chest on the frontal view, and increased fullness in the left hilum. The findings are concerning for recurrence. Recommend CT imaging for better evaluation. Effusion and underlying opacity, likely atelectasis or scar, in the left base. Electronically Signed   By: Dorise Bullion III M.D   On: 07/23/2018 15:51   Ct Angio Chest Pe W And/or Wo  Contrast  Result Date: 07/23/2018 CLINICAL DATA:  Shortness of breath. EXAM: CT ANGIOGRAPHY CHEST WITH CONTRAST TECHNIQUE: Multidetector CT imaging of the chest was performed using the standard protocol during bolus administration of intravenous contrast. Multiplanar CT image reconstructions and MIPs were obtained to evaluate the vascular anatomy. CONTRAST:  141mL OMNIPAQUE IOHEXOL 350 MG/ML SOLN COMPARISON:  CT chest dated August 02, 2016 FINDINGS: Cardiovascular: There is an acute pulmonary embolus involving the main right pulmonary artery. Emboli are seen extending into the right lower, right middle and right upper pulmonary arteries. Evaluation is somewhat limited by motion artifact. There is CT evidence of right heart strain with an RV/LV ratio measuring approximately 1.3. There are coronary artery calcifications. The heart size is normal. There is no significant pericardial effusion. Mediastinum/Nodes: There are multiple pathologically enlarged mediastinal lymph nodes measuring up to approximately 3.7 cm (axial series 4, image 45). There are no pathologically enlarged axillary or supraclavicular lymph nodes. The thyroid gland is unremarkable. Lungs/Pleura: There is a trace left-sided pleural effusion. There is a left upper lobe subpleural pulmonary nodule measuring approximately 2 cm (axial series 6, image 73). There are additional scattered areas of pleural thickening involving the left upper lobe. There is atelectasis at the left lung base. There is significant elevation of the left hemidiaphragm. 2 Upper Abdomen: There is a very abnormal appearance of the partially visualized right kidney. There is a partially visualized presumed right  renal mass measuring approximately 7.3 cm. There is a 3.7 cm mass centered within the left anterior hemidiaphragm (axial series 4, image 83). Musculoskeletal: No chest wall abnormality. No acute or significant osseous findings. Review of the MIP images confirms the above  findings. IMPRESSION: 1. Positive for acute pulmonary embolus essentially within the main right pulmonary artery with extension into the right middle lobe and right lower lobe pulmonary arteries. Positive for acute PE with CT evidence of right heart strain (RV/LV Ratio = 1.3) consistent with at least submassive (intermediate risk) PE. The presence of right heart strain has been associated with an increased risk of morbidity and mortality. 2. Worsening metastatic disease as evidenced by multiple pathologically enlarged mediastinal lymph nodes as detailed above in addition to multiple enlarging subpleural pulmonary nodules on the left and a metastatic deposit that appears to be centered within or adjacent to the left hemidiaphragm. 3. Large partially visualized right renal mass. 4. Elevation of the left hemidiaphragm. There is a trace left-sided pleural effusion with adjacent atelectasis. These results were called by telephone at the time of interpretation on 07/23/2018 at 5:46 pm to Dr. Davonna Belling , who verbally acknowledged these results. Electronically Signed   By: Constance Holster M.D.   On: 07/23/2018 17:48   Vas [place] Lower Extremity Venous (dvt)  Result Date: 07/24/2018  Lower Venous Study Indications: Pulmonary embolism.  Risk Factors: Confirmed PE Cancer metastatic cancer-renal, lung, bone, pelvis, soft tissue. Comparison Study: No prior study on file for comparison Performing Technologist: Sharion Dove RVS  Examination Guidelines: A complete evaluation includes B-mode imaging, spectral Doppler, color Doppler, and power Doppler as needed of all accessible portions of each vessel. Bilateral testing is considered an integral part of a complete examination. Limited examinations for reoccurring indications may be performed as noted.  +---------+---------------+---------+-----------+----------+-------+  RIGHT     Compressibility Phasicity Spontaneity Properties Summary   +---------+---------------+---------+-----------+----------+-------+  CFV       Full            Yes       Yes                             +---------+---------------+---------+-----------+----------+-------+  SFJ       Full                                                      +---------+---------------+---------+-----------+----------+-------+  FV Prox   Full                                                      +---------+---------------+---------+-----------+----------+-------+  FV Mid    Full                                                      +---------+---------------+---------+-----------+----------+-------+  FV Distal Full                                                      +---------+---------------+---------+-----------+----------+-------+  PFV       Full                                                      +---------+---------------+---------+-----------+----------+-------+  POP       Full            Yes       Yes                             +---------+---------------+---------+-----------+----------+-------+  PTV       Full                                                      +---------+---------------+---------+-----------+----------+-------+  PERO      Full                                                      +---------+---------------+---------+-----------+----------+-------+   +---------+---------------+---------+-----------+----------+-------------------+  LEFT      Compressibility Phasicity Spontaneity Properties Summary              +---------+---------------+---------+-----------+----------+-------------------+  CFV       Full            Yes       Yes                                         +---------+---------------+---------+-----------+----------+-------------------+  SFJ       Full                                                                  +---------+---------------+---------+-----------+----------+-------------------+  FV Prox   Full                                                                   +---------+---------------+---------+-----------+----------+-------------------+  FV Mid    Full                                                                  +---------+---------------+---------+-----------+----------+-------------------+  FV Distal Full                                                                  +---------+---------------+---------+-----------+----------+-------------------+  PFV       Full                                                                  +---------+---------------+---------+-----------+----------+-------------------+  POP       Full            Yes       Yes                    sluggish flow noted  +---------+---------------+---------+-----------+----------+-------------------+  PTV       Full                                                                  +---------+---------------+---------+-----------+----------+-------------------+  PERO      Full                                                                  +---------+---------------+---------+-----------+----------+-------------------+     Summary: Right: There is no evidence of deep vein thrombosis in the lower extremity. Left: There is no evidence of deep vein thrombosis in the lower extremity.  *See table(s) above for measurements and observations.    Preliminary    Scheduled Meds:  atorvastatin  40 mg Oral Daily   levothyroxine  75 mcg Oral QAC breakfast   predniSONE  5 mg Oral Q breakfast   Continuous Infusions:  heparin 1,200 Units/hr (07/24/18 1345)    LOS: 0 days   Kerney Elbe, DO [facility] PAGER is on [place]  If 7PM-7AM, please contact night-coverage [email or web address] Password [facility] 07/24/2018, 3:07 PM

## 2018-07-24 NOTE — Progress Notes (Signed)
  Echocardiogram 2D Echocardiogram has been performed.  Jeremy Chan 07/24/2018, 12:30 PM

## 2018-07-24 NOTE — Progress Notes (Signed)
Paged Baltazar Najjar regarding removal of droplet prec. order.

## 2018-07-24 NOTE — Progress Notes (Signed)
Spoke with pt concerning being able to purchase his medications. Pt states that he has part D and [employer]. Pt continues to state that he is able to purchase his medications. He will benefit with getting coupon from Pharm D for 30 day supply anticoagulant.

## 2018-07-24 NOTE — Progress Notes (Signed)
[place] for heparin drip Indication: pulmonary embolus  Allergies  Allergen Reactions  . No Known Allergies     Patient Measurements: Height: 5' 9.5" (176.5 cm) Weight: 195 lb (88.5 kg) IBW/kg (Calculated) : 71.85 Height: 176.5 cm Weight: 88.5 kg Heparin Dosing Weight = TBW = 88.5 kg  Vital Signs: Temp: 98.1 F (36.7 C) (06/18 1100) Temp Source: Oral (06/18 1100) BP: 102/55 (06/18 1100) Pulse Rate: 60 (06/18 1100)  Labs: Recent Labs    07/23/18 1257  07/23/18 1857 07/23/18 2151 07/24/18 0358 07/24/18 0949 07/24/18 1223  HGB 12.2*  --   --   --  12.3*  --   --   HCT 38.1*  --   --   --  37.7*  --   --   PLT 155  --   --   --  145*  --   --   APTT  --   --  29  --   --   --   --   LABPROT  --   --  13.2  --   --   --   --   INR  --   --  1.0  --   --   --   --   HEPARINUNFRC  --   --   --   --  0.73*  --  0.81*  CREATININE 1.39*  --   --   --  1.20  --   --   TROPONINI  --    < > <0.03 <0.03 <0.03 <0.03  --    < > = values in this interval not displayed.    CrCl = 48 mL/min  Assessment: Pt has PMH significant for metastatic cancer; presenting with SOB. Pt found to have PE and pharmacy consulted to dose/monitor heparin infusion. Pt was not on anticoagulants PTA. Baseline labs WNL.  Today, 07/24/18  Repeat Heparin level is supra-therapeutic at 0.81 on heparin infusion at 1350 units/hr.    CBC: Hg stable at 12.3, Plt slightly low at 145 but stable.    No bleeding or infusion related issues reported by RN.   Goal of Therapy:  Heparin level 0.3-0.7 units/ml Monitor platelets by anticoagulation protocol: Yes   Plan:   Derease heparin infusion to 1200 units/hr  Check anti-Xa level in 8 hours and daily while on heparin  Continue to monitor H&H and platelets  Monitor for signs/symptoms of bleeding  Follow for plan to change to oral anticoagulation  Netta Cedars, PharmD, BCPS 07/24/2018 12:54 PM

## 2018-07-24 NOTE — Evaluation (Signed)
Physical Therapy Evaluation Patient Details Name: Jeremy Chan MRN: [id number] DOB: [birth year] Today's Date: 07/24/2018   History of Present Illness  Jeremy Chan is a 78 y.o. male found to have PE and initiated therapeutic heparin. 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, Pan-hypopitutarism. Presented with less of breath worse than his baseline and worse with exertion he got suddenly very short of breath today when he was trying to clean off the urine on the floor when his dog had an accident.    Clinical Impression  Jeremy Chan is being evaluated for mobility deficits for current admission due to SOB and positive PE findings. He has initiated heparin treatment and is on  2 L/min O2 via nasal cannula. Jeremy Chan was agreeable to participate in evaluation and denied SOB throughout session. SpO2 remained at or above 94% entire session. He was able to ambulate with 100 feet while pushing his IV pole and performed backward ambulation with no difficulty. He has some limited balance in SLS but demonstrated safe stepping reactions to prevent LOB. Jeremy Chan is independent with transfers but requires supervision due to lines/tube management. He was educated at [place] on benefits of outpatient Jeremy Chan to initiate exercise program and transition to regular exercises/walking routine independently. He was agreeable to this plan and is eager to improve his overall health and wellness. Patient will be discharged to nursing staff to walk daily and does not require skilled Acute Jeremy Chan services.    Follow Up Recommendations  [name]    [name]    Recommendations for Other Services       Precautions / Restrictions Precautions Precaution Comments: on supplemental O2 Restrictions Weight Bearing Restrictions: No      Mobility  Bed Mobility Overal bed mobility: Independent       Transfers Overall transfer level: Independent      General transfer comment: Jeremy Chan independent with sit<>stand transfers at EOB and in bedside recliner.  Ambulation/Gait Ambulation/Gait assistance: Supervision;Modified independent (Device/Increase time) Gait Distance (Feet): 100 Feet Assistive device: IV Pole Gait Pattern/deviations: WFL(Within Functional Limits) Gait velocity: slightly slow but steady gait Gait velocity interpretation: 1.31 - 2.62 ft/sec, indicative of limited community ambulator        Balance Overall balance assessment: Modified Independent   Sitting balance-Leahy Scale: Normal     Standing balance support: No upper extremity supported;During functional activity;Single extremity supported Standing balance-Leahy Scale: Good Standing balance comment: Jeremy Chan able to maintain balance without UE support to march in place, he ambulates with IV pole and on 2 L/min O2 via nasal cannula Single Leg Stance - Right Leg: 9 Single Leg Stance - Left Leg: 4         High level balance activites: Backward walking High Level Balance Comments: Jeremy Chan performed backaward walking with no overt LOB or unsteadiness             Pertinent Vitals/Pain Pain Assessment: No/denies pain    Home Living Family/patient expects to be discharged to:: Private residence Living Arrangements: Spouse/significant other Available Help at Discharge: Family Type of Home: House Home Access: Stairs to enter  Entrance Stairs-Number of Steps: 2 (6" step) Home Layout: One level Home Equipment: None Additional Comments: Jeremy Chan  spouse has MS and uses a wheelchair and RW at home. He is the primary caregiver of his wife but reports he does not have to help her with mobility. He has a strong support system with his daughter and son. He also has a dog at home.    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     Hand Dominance        Extremity/Trunk Assessment   Upper Extremity Assessment Upper Extremity Assessment: Overall WFL for tasks assessed    Lower Extremity Assessment Lower Extremity Assessment: Overall WFL for tasks assessed    Cervical / Trunk Assessment Cervical / Trunk Assessment: Normal  Communication   Communication: No difficulties  Cognition Arousal/Alertness: Awake/alert Behavior During Therapy: WFL for tasks assessed/performed Overall Cognitive Status: Within Functional Limits for tasks assessed                Assessment/Plan    Jeremy Chan Assessment Patent does not need any further Jeremy Chan services;All further Jeremy Chan needs can be met in the next venue of care  Jeremy Chan Problem List Decreased strength;Decreased activity tolerance       Jeremy Chan Treatment Interventions      Jeremy Chan Goals (Current goals can be found in the Care Plan section)  [name] Patient Stated Goal: expressed interest in beginning an exercise program for regular health benefits once he returns home Jeremy Chan Goal Formulation: With patient Time For Goal Achievement: 07/31/18 Potential to Achieve Goals: Good     AM-PAC Jeremy Chan "6 Clicks" Mobility  Outcome Measure Help needed turning from your back to your side while in a flat bed without using bedrails?: None Help needed moving from lying on your back to sitting on the side of a flat bed without using bedrails?: None Help needed moving to and from a bed to a chair (including a wheelchair)?: None Help needed standing up from a chair using your  arms (e.g., wheelchair or bedside chair)?: None Help needed to walk in hospital room?: A Little Help needed climbing 3-5 steps with a railing? : A Little 6 Click Score: 22    End of Session Equipment Utilized During Treatment: Gait belt Activity Tolerance: Patient tolerated treatment well Patient left: in chair;with call bell/phone within reach;with chair alarm set Nurse Communication: Mobility status Jeremy Chan Visit Diagnosis: Difficulty in walking, not elsewhere classified (R26.2)    Time: 1916-6060 Jeremy Chan Time Calculation (min) (ACUTE ONLY): 29 min   Charges:   Jeremy Chan Evaluation $Jeremy Chan Eval Low Complexity: 1 Low Jeremy Chan Treatments $Gait Training: 8-22 mins        Kipp Brood, Jeremy Chan, DPT, [facility] Physical Therapist with [facility]  07/24/2018 4:00 PM

## 2018-07-24 NOTE — Progress Notes (Signed)
[place] for heparin drip Indication: pulmonary embolus  Allergies  Allergen Reactions  . No Known Allergies     Patient Measurements: Height: 5' 9.5" (176.5 cm) Weight: 195 lb (88.5 kg) IBW/kg (Calculated) : 71.85 Height: 176.5 cm Weight: 88.5 kg Heparin Dosing Weight = TBW = 88.5 kg  Vital Signs: Temp: 98.5 F (36.9 C) (06/17 2122) Temp Source: Oral (06/17 2122) BP: 137/75 (06/17 2122) Pulse Rate: 85 (06/17 2122)  Labs: Recent Labs    07/23/18 1257 07/23/18 1857 07/23/18 2151 07/24/18 0358  HGB 12.2*  --   --  12.3*  HCT 38.1*  --   --  37.7*  PLT 155  --   --  145*  APTT  --  29  --   --   LABPROT  --  13.2  --   --   INR  --  1.0  --   --   HEPARINUNFRC  --   --   --  0.73*  CREATININE 1.39*  --   --  1.20  TROPONINI  --  <0.03 <0.03  --     CrCl = 48 mL/min  Assessment: Pt has PMH significant for metastatic cancer; presenting with SOB. Pt found to have PE and pharmacy consulted to dose/monitor heparin infusion. Pt was not on anticoagulants PTA. Baseline labs ordered STAT.  Today, 07/24/18 First heparin level is therapeutic at 0.73 after 2400 unit bolus and drip at 1350 units/hr.  Hg stable at 12.3, Plt slightly low at 145 but stable.  No bleeding reported.   Goal of Therapy:  Heparin level 0.3-0.7 units/ml Monitor platelets by anticoagulation protocol: Yes   Plan:   continue heparin infusion at 1350 units/hr  Check confirmatory anti-Xa level in 8 hours and daily while on heparin  Continue to monitor H&H and platelets  Monitor for signs/symptoms of bleeding  Follow for plan to change to oral anticoagulation  Eudelia Bunch, Pharm.D 07/24/2018 4:26 AM

## 2018-07-25 ENCOUNTER — Inpatient Hospital Stay (HOSPITAL_COMMUNITY): Payer: Medicare Other

## 2018-07-25 DIAGNOSIS — R042 Hemoptysis: Secondary | ICD-10-CM

## 2018-07-25 LAB — CBC WITH DIFFERENTIAL/PLATELET
Abs Immature Granulocytes: 0.09 10*3/uL — ABNORMAL HIGH (ref 0.00–0.07)
Basophils Absolute: 0 10*3/uL (ref 0.0–0.1)
Basophils Relative: 0 %
Eosinophils Absolute: 0.2 10*3/uL (ref 0.0–0.5)
Eosinophils Relative: 1 %
HCT: 38 % — ABNORMAL LOW (ref 39.0–52.0)
Hemoglobin: 11.8 g/dL — ABNORMAL LOW (ref 13.0–17.0)
Immature Granulocytes: 1 %
Lymphocytes Relative: 11 %
Lymphs Abs: 1.4 10*3/uL (ref 0.7–4.0)
MCH: 31.8 pg (ref 26.0–34.0)
MCHC: 31.1 g/dL (ref 30.0–36.0)
MCV: 102.4 fL — ABNORMAL HIGH (ref 80.0–100.0)
Monocytes Absolute: 1.7 10*3/uL — ABNORMAL HIGH (ref 0.1–1.0)
Monocytes Relative: 13 %
Neutro Abs: 10.2 10*3/uL — ABNORMAL HIGH (ref 1.7–7.7)
Neutrophils Relative %: 74 %
Platelets: 135 10*3/uL — ABNORMAL LOW (ref 150–400)
RBC: 3.71 MIL/uL — ABNORMAL LOW (ref 4.22–5.81)
RDW: 14 % (ref 11.5–15.5)
WBC: 13.6 10*3/uL — ABNORMAL HIGH (ref 4.0–10.5)
nRBC: 0 % (ref 0.0–0.2)

## 2018-07-25 LAB — COMPREHENSIVE METABOLIC PANEL
ALT: 9 U/L (ref 0–44)
AST: 15 U/L (ref 15–41)
Albumin: 3.6 g/dL (ref 3.5–5.0)
Alkaline Phosphatase: 60 U/L (ref 38–126)
Anion gap: 11 (ref 5–15)
BUN: 20 mg/dL (ref 8–23)
CO2: 22 mmol/L (ref 22–32)
Calcium: 8.5 mg/dL — ABNORMAL LOW (ref 8.9–10.3)
Chloride: 102 mmol/L (ref 98–111)
Creatinine, Ser: 1.11 mg/dL (ref 0.61–1.24)
GFR calc Af Amer: >60 mL/min (ref >60)
GFR calc non Af Amer: >60 mL/min (ref >60)
Glucose, Bld: 93 mg/dL (ref 70–99)
Potassium: 4.5 mmol/L (ref 3.5–5.1)
Sodium: 135 mmol/L (ref 135–145)
Total Bilirubin: 0.8 mg/dL (ref 0.3–1.2)
Total Protein: 6.6 g/dL (ref 6.5–8.1)

## 2018-07-25 LAB — MAGNESIUM: Magnesium: 2.2 mg/dL (ref 1.7–2.4)

## 2018-07-25 LAB — PHOSPHORUS: Phosphorus: 2.5 mg/dL (ref 2.5–4.6)

## 2018-07-25 LAB — HEPARIN LEVEL (UNFRACTIONATED): Heparin Unfractionated: 0.47 IU/mL (ref 0.30–0.70)

## 2018-07-25 MED ORDER — ENOXAPARIN (LOVENOX) PATIENT EDUCATION KIT
PACK | Freq: Once | Status: DC
  Filled 2018-07-25: qty 1

## 2018-07-25 MED ORDER — HEPARIN (PORCINE) 25000 UT/250ML-% IV SOLN
1400.0000 [IU]/h | INTRAVENOUS | Status: DC
  Administered 2018-07-26: 1200 [IU]/h via INTRAVENOUS
  Filled 2018-07-25: qty 250

## 2018-07-25 MED ORDER — SODIUM CHLORIDE 0.9 % IV SOLN
INTRAVENOUS | Status: AC
  Administered 2018-07-25 (×2): via INTRAVENOUS

## 2018-07-25 MED ORDER — ENOXAPARIN SODIUM 100 MG/ML ~~LOC~~ SOLN: 90.0000 mg | Freq: Two times a day (BID) | SUBCUTANEOUS | Status: DC

## 2018-07-25 NOTE — Care Management (Signed)
CMA spoke with Ghel A. With [place].  Patient only has coverage for medical not medications.   CMA has notified Case IT consultant, Juliann Pulse.     Meiko Ives Care Management Assistant  Email:[email or web address] Office: [phone]

## 2018-07-25 NOTE — Progress Notes (Signed)
Returned patient's son call and updated him on plan of care, also notify MD that son has some question for him.

## 2018-07-25 NOTE — Progress Notes (Signed)
Patient coughed up small bloody sputum X2, on heparin drip, Dr. Alfredia Ferguson and pharmacist-Terri notified. Will continue to monitor patient.

## 2018-07-25 NOTE — Progress Notes (Signed)
[place] for heparin drip Indication: pulmonary embolus  Allergies  Allergen Reactions  . No Known Allergies     Patient Measurements: Height: 5' 9.5" (176.5 cm) Weight: 195 lb (88.5 kg) IBW/kg (Calculated) : 71.85 Height: 176.5 cm Weight: 88.5 kg Heparin Dosing Weight = TBW = 88.5 kg  Vital Signs: Temp: 98.4 F (36.9 C) (06/19 1319) Temp Source: Oral (06/19 1319) BP: 123/64 (06/19 1319) Pulse Rate: 85 (06/19 1319)  Labs: Recent Labs    07/23/18 1257  07/23/18 1857 07/23/18 2151  07/24/18 0358 07/24/18 0949 07/24/18 1223 07/24/18 2158 07/25/18 0446  HGB 12.2*  --   --   --   --  12.3*  --   --   --  11.8*  HCT 38.1*  --   --   --   --  37.7*  --   --   --  38.0*  PLT 155  --   --   --   --  145*  --   --   --  135*  APTT  --   --  29  --   --   --   --   --   --   --   LABPROT  --   --  13.2  --   --   --   --   --   --   --   INR  --   --  1.0  --   --   --   --   --   --   --   HEPARINUNFRC  --   --   --   --    < > 0.73*  --  0.81* 0.46 0.47  CREATININE 1.39*  --   --   --   --  1.20  --   --   --  1.11  TROPONINI  --    < > <0.03 <0.03  --  <0.03 <0.03  --   --   --    < > = values in this interval not displayed.    CrCl = 60 mL/min  Assessment: Pt has PMH significant for metastatic cancer; presenting with SOB. Pt found to have PE and pharmacy consulted to dose/monitor heparin infusion. Pt was not on anticoagulants PTA. Baseline labs WNL.  Today, 07/25/18  Heparin level is therapeutic at 0.47 on heparin infusion at 1200 units/hr.    CBC: Hg/pltc slightly low trending down (pltc 155>135)    No bleeding or infusion related issues reported by RN.   Goal of Therapy:  Heparin level 0.3-0.7 units/ml Monitor platelets by anticoagulation protocol: Yes   Plan:   Continue heparin infusion at 1200 units/hr   Monitor for signs/symptoms of bleeding  At 8pm, D/C heparin and transition to Lovenox 68m sq Q12h  Lovenox  discharge kit.  RN to provide administration education.  MNetta Cedars PharmD, BCPS 07/25/2018 4:33 PM   Addendum 1630 Noted bloody sputum, MRI ordered Do not transition to Lovenox, continue Heparin infusion  Consider transition to [place](possible Apixaban) based on MRI results Usual dose Apixaban for PE would be 1727mbid x  7 days, then [id number]  GreMinda DittoarmD 07/25/2018, 4:41 PM

## 2018-07-25 NOTE — Progress Notes (Signed)
PROGRESS NOTE    Jeremy Chan  YPP:[id number] DOB: 06-15-1940 DOA: 07/23/2018 PCP: Jeremy Rushing, MD   Brief Narrative:  HPI per Jeremy Chan on 07/23/2018 Jeremy Chan is a 78 y.o. 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  When EMS arrived his oxygen saturation was down to 92% he was placed on nonrebreather secondary to increased work of breathing although by that time his oxygen saturation was 95% room air He has had decreased appetite and fatigue which is been chronic since his diagnosis of cancer having had significant coughing no fevers no chest pain he denies any leg swelling   Recently seen by his oncologist CT 07/16/18 showed enlargement of a right renal neoplasm w/d interval extension into the R renal vein and IVC, enlargement of multiple necrotic mediastinal lymph node conglomerates, and increased nodularity chest and abdomen. Stable multiple sclerotic and lytic foci of osseous metastatic dise.  Plan is to hold immunotherapy w/ switch to cabozantinib   recent CT chest/abd/pelvis from 07/16/2018  Showed  progression of disease including enlargement of a right renal neoplasm with interval extension into the right renal vein and inferior vena cava, enlargement of multiple necrotic mediastinal lymph node conglomerates, and enlarging nodularity involving  the chest and abdomen   **Interim History Patient remains on a heparin drip and case was discussed with In house oncology who recommends full dose anticoagulation with Lovenox at discharge I spoke with the patient's primary oncologist who recommends either way starting him on a anticoagulant and is okay with doing a Jeremy Chan..  Echocardiogram was done and not show any right heart strain and no evidence of any right systolic dysfunction and lower extremity duplex was done and was negative for VTE.  After discussion with his primary oncologist Jeremy Chan, he recommends obtaining an MRI and continue heparin drip for now before transitioning to rule out any metastatic lesions with hemorrhagic transformation in the brain.  Assessment & Plan:   Principal Problem:   Pulmonary embolism (HCC) Active Problems:   Hyperlipidemia   Essential hypertension   Renal cell cancer, right (HCC)   CAD (coronary artery disease)   Dehydration   AKI (acute kidney injury) ([place])   Hypothyroidism   Hypopituitarism ([place])  Acute Pulmonary Embolism  -Setting of hypercoagulable state secondary to his malignancies -Admitted to inpatient telemetry and will continue telemetry monitoring -Cycle cardiac troponins were less than 0.034 -CTA of the chest showed positive acute PE essentially within the main right pulmonary artery with extension to the right middle lobe and right lower lobe pulmonary arteries.  There is CT evidence of right heart strain with RV/LV ratio of 1.3 consistent with a recent submassive PE -Started on heparin drip and will continue for now -Discussed with medical oncology Jeremy Chan who recommends transitioning to Lovenox at discharge 1 mg/kg twice daily at discharge but will continue heparin drip for at least 48 hours currently; after further discussion  and discussion with his primary oncologist Jeremy Chan at [facility], Jeremy Chan recommends starting the patient on a NOAC at D/C as he does not have a  Preference but recommends obtaining an MRI of the brain given patient's worsened metastatic disease -Continue to hold home blood pressure medications and avoid hypotension -Echocardiogram done and showed a normal systolic function with a EF of 55 to 60% with cavity size that was normal.  There is no increase in left ventricular wall thickness and left ventricular diastolic parameters were consistent with impaired relaxation the right ventricle has a normal systolic function -No evidence of right heart strain or systolic dysfunction on echocardiogram -Lower extremity Dopplers were negative for VTE -Have consulted case management for cost of full dose Lovenox 1 milligram per kilogram and this is on patient's formulary and after discussion with patient's primary oncologist he recommends starting a Jeremy Chan likely apixaban at discharge but prior to this will obtain MRI to evaluate the brain for any metastatic lesions given concern for any hemorrhagic transformation -Patient had 2 small sputum's that were bloody today and will continue to monitor carefully -We will need home ambulatory screen prior to discharge  Acute Respiratory Failure with hypoxia secondary to above -Patient was placed on 2 L of supplemental oxygen via nasal cannula -Continue pulse oximetry and maintain O2 saturation greater than 92% -Continue with supplemental oxygen via nasal cannula and wean O2 as tolerated -Repeat chest x-ray this morning showed persistent mediastinal fullness in the left hilar fullness consistent with known adenopathy.  There is persistent left midlung pleural-based density again and there is also left base atelectasis and infiltrate as well as left-sided pleural effusion again noted -in the setting of PE -We will need home amatory screen prior to discharge  Hyperlipidemia  -Stable continue home medications with atorvastatin 40 g p.o. nightly  Essential Hypertension -Continue hold home medications with  Amlodipine-Benazepril Combination given PE and AKI -Continued IV fluid hydration with normal saline at 50 mL's per hour today but then will stop  Metastatic Renal cell cancer, Right Hx of Prostate Cancer -Followed at [facility] -Renal cancer has metastasized to the lung, the bone, the pelvis as well as soft tissue; patient is currently on nivolumab -Also had a history of laryngeal cancer that involved bilateral vocal cord status post radiation and history of prostate cancers treated with brachytherapy implantations over 2000 -Currently have change in immunotherapy given failure of a previous treatment and progression have not started new therapy yet -CTA showed worsening metastatic disease as evidenced by multiple pathologically enlarged mediastinal lymph nodes in addition to enlarging sub-pleural pulmonary nodules on the left and metastatic deposit that appears to be centered within or adjacent to the left hemidiaphragm -I spoke with Jeremy Chan his primary oncologist who is planning on starting a new immunotherapy at discharge but Jeremy Chan was concerned about possible metastatic lesions to the brain given his worsening metastatic disease acutely so he recommends obtaining an MRI of the brain without contrast for now to make sure the patient does not have any metastatic lesions to the brain with possible bleeding as he is on anticoagulation -If MRI is negative we will proceed with transitioning to a novel oral anticoagulant agent is Apixaban; currently MRI still pending  CAD  with history of stents in the early 2000 -Stable continue home medications with ASA 81 mg po Daily and Atorvastatin 40 mg po Daily   Dehydration  -we will gently rehydrate and follow kidney status -BUN/Cr  is improving slightly and went from 24/1.39 -> 20/1.20 -> 20/1.11  AKI (acute kidney injury) ([place]) most likely secondary to dehydration -IV fluid hydration with normal saline at rate 50 mL's per hour is now  been discontinued -BUN/Cr went from 24/1.39 -> 20/1.20 -> 20/1.11 -Continue to Monitor and follow Renal Fxn -Avoid Nephrotoxic Medications if possible   Pan-hypopituitarism Hypothyroidism   -Continue Home Prednisone and Synthroid -Patient sees Dr. Herschel [place] in endocrinology and also receives testosterone every 2 weeks  Macrocytic Anemia -Patient's Hb/Hct went from 12.2/38.1 -> 12.3/37.7 -> 11.8/38.0 -Check Anemia Panel in the AM -Continue to Monitor for S/Sx of Bleeding as patient is anticoagulated with Heparin gtt; Currently no overt signs of bleeding -Repeat CBC in AM   Thrombocytopenia Patient's platelet count went from 155,000 and is now 145,000 -> 135,000 -Continue to monitor for signs and symptoms of bleeding; currently no overt bleeding noted -Continue monitor carefully as patient is on a heparin drip -Repeat CBC in a.m.  Leukocytosis -Likely reactive in the setting of PE -Trending downward as patient is WBC went from 13.5 is now 10.6 but now is trending back up again and is 13.6 -Continue monitor for signs and symptoms of infection -Repeat CBC in a.m.   DVT prophylaxis: Anticoagulated with Heparin gtt Code Status: FULL CODE  Family Communication: No family present at bedside but I spoke with the son over the phone Disposition Plan: Remain Inpatient for continued Workup and treatment and Anticipate D/C in the next 24-48 hours if medically stable and off of heparin drip  Consultants:   None but case was discussed with Medical Oncology Jeremy Chan  Case was also discussed with [facility] oncology Jeremy Chan   Procedures:  ECHOCARDIOGRAM 07/23/2018 IMPRESSIONS    1. The left ventricle has normal systolic function, with an ejection fraction of 55-60%. The cavity size was normal. Left ventricular diastolic Doppler parameters are consistent with impaired relaxation.  2. The right ventricle has normal systolic function. The cavity was normal. There is no increase  in right ventricular wall thickness.  FINDINGS  Left Ventricle: The left ventricle has normal systolic function, with an ejection fraction of 55-60%. The cavity size was normal. There is no increase in left ventricular wall thickness. Left ventricular diastolic Doppler parameters are consistent with  impaired relaxation.  Right Ventricle: The right ventricle has normal systolic function. The cavity was normal. There is no increase in right ventricular wall thickness.  Left Atrium: Left atrial size was normal in size.  Right Atrium: Right atrial size was normal in size. Right atrial pressure is estimated at 10 mmHg.  Interatrial Septum: No atrial level shunt detected by color flow Doppler.  Pericardium: There is no evidence of pericardial effusion.  Mitral Valve: The mitral valve is normal in structure. Mitral valve regurgitation is not visualized by color flow Doppler.  Tricuspid Valve: The tricuspid valve is normal in structure. Tricuspid valve regurgitation was not visualized by color flow Doppler.  Aortic Valve: The aortic valve is normal in structure. Aortic valve regurgitation was not visualized by color flow Doppler.  Pulmonic Valve: The pulmonic valve was normal in structure. Pulmonic valve regurgitation is not visualized by color flow Doppler.  Venous: The inferior vena cava is normal in size with greater than 50% respiratory variability.    +--------------+--------++  LEFT VENTRICLE            +----------------+---------++ +--------------+--------++  Diastology  PLAX 2D                   +----------------+---------++ +--------------+--------++  LV e' lateral:   9.63 cm/s    LVIDd:         5.15 cm    +----------------+---------++ +--------------+--------++  LV E/e' lateral: 6.4          LVIDs:         3.04 cm    +----------------+---------++ +--------------+--------++  LV e' medial:    7.94 cm/s    LV PW:         1.04 cm     +----------------+---------++ +--------------+--------++  LV E/e' medial:  7.8          LV IVS:        0.75 cm    +----------------+---------++ +--------------+--------++  LVOT diam:     2.00 cm    +--------------+--------++  LV SV:         90 ml      +--------------+--------++  LV SV Index:   43.06      +--------------+--------++  LVOT Area:     3.14 cm   +--------------+--------++                            +--------------+--------++  +---------------+----------++  RIGHT VENTRICLE              +---------------+----------++  RV S prime:     11.20 cm/s   +---------------+----------++  +---------------+-------++-----------++  LEFT ATRIUM              Index         +---------------+-------++-----------++  LA diam:        3.50 cm  1.70 cm/m    +---------------+-------++-----------++  LA Vol (A2C):   29.3 ml  14.26 ml/m   +---------------+-------++-----------++  LA Vol (A4C):   42.4 ml  20.64 ml/m   +---------------+-------++-----------++  LA Biplane Vol: 36.4 ml  17.72 ml/m   +---------------+-------++-----------++  +------------+-----------++  AORTIC VALVE               +------------+-----------++  LVOT Vmax:   70.90 cm/s    +------------+-----------++  LVOT Vmean:  46.900 cm/s   +------------+-----------++  LVOT VTI:    0.157 m       +------------+-----------++   +-------------+-------++  AORTA                   +-------------+-------++  Ao Root diam: 3.10 cm   +-------------+-------++  +--------------+----------++  MITRAL VALVE                +--------------+-------+ +--------------+----------++  SHUNTS                   MV Area (PHT): 2.73 cm     +--------------+-------+ +--------------+----------++  Systemic VTI:  0.16 m    MV PHT:        80.62 msec   +--------------+-------+ +--------------+----------++  Systemic Diam: 2.00 cm   MV Decel Time: 278 msec     +--------------+-------+ +--------------+----------++ +--------------+----------++  MV E  velocity: 61.60 cm/s   +--------------+----------++  MV A velocity: 75.70 cm/s   +--------------+----------++  MV E/A ratio:  0.81         +--------------+----------++   Antimicrobials:  Anti-infectives (From admission, onward)   None     Subjective: Seen and examined at bedside and was feeling significantly weak and tired.  No nausea or vomiting.  States shortness of breath  is stable.  Denies any lightheadedness or dizziness but feels that oxygen helps.  Did cough up some bloody tinged sputum x2 earlier today.  No other concerns or complaints at this time.  Objective: Vitals:   07/24/18 2015 07/25/18 0502 07/25/18 0644 07/25/18 1319  BP: 130/73 (!) 97/53 127/64 123/64  Pulse: 74 80 76 85  Resp: 18 18  20   Temp: 99.5 F (37.5 C) 100 F (37.8 C) 99 F (37.2 C) 98.4 F (36.9 C)  TempSrc: Oral Oral Oral Oral  SpO2: 100% 100%  95%  Weight:      Height:        Intake/Output Summary (Last 24 hours) at 07/25/2018 1840 Last data filed at 07/25/2018 1746 Gross per 24 hour  Intake 1779.9 ml  Output 1700 ml  Net 79.9 ml   Filed Weights   07/23/18 1824  Weight: 88.5 kg   Examination: Physical Exam:  Constitutional: Well-nourished, well-developed elderly overweight Caucasian male who is chronically ill-appearing and a slight respiratory distress and appears mildly uncomfortable Eyes: Lids and conjunctive are normal.  Sclera anicteric ENMT: External ears and nose appear normal.  Grossly normal hearing Neck: Appears supple no JVD Respiratory: Diminished auscultation bilaterally with no appreciable wheezing: Appears slightly labored breathing with mild tachypnea.  Wearing 2 L supplemental oxygen via nasal cannula Cardiovascular: Regular rate and rhythm.  Has a 2/6 systolic murmur.  Has trace extremity edema Abdomen: Soft, nontender, distended second body habitus.  Bowel sounds present. GU: Deferred Musculoskeletal: No contractures or cyanosis.  No joint fomites in upper  extremities noted Skin: Appears warm and dry no appreciable rashes or lesions on her skin evaluation Neurologic: Cranial nerves II through XII gross intact no appreciable focal deficits.  Romberg sign cerebellar reflexes were not assessed Psychiatric: Normal judgment insight.  Patient is awake, alert, oriented.  Slightly anxious mood appropriate affect  Data Reviewed: I have personally reviewed following labs and imaging studies  CBC: Recent Labs  Lab 07/23/18 1257 07/24/18 0358 07/25/18 0446  WBC 13.5* 10.6* 13.6*  NEUTROABS 10.9*  --  10.2*  HGB 12.2* 12.3* 11.8*  HCT 38.1* 37.7* 38.0*  MCV 101.3* 101.3* 102.4*  PLT 155 145* 539*   Basic Metabolic Panel: Recent Labs  Lab 07/23/18 1257 07/24/18 0358 07/25/18 0446  NA 137 136 135  K 3.8 4.0 4.5  CL 102 103 102  CO2 25 22 22   GLUCOSE 91 94 93  BUN 24* 20 20  CREATININE 1.39* 1.20 1.11  CALCIUM 9.0 8.5* 8.5*  MG  --  2.3 2.2  PHOS  --  2.6 2.5   GFR: Estimated Creatinine Clearance: 59.9 mL/min (by C-G formula based on SCr of 1.11 mg/dL). Liver Function Tests: Recent Labs  Lab 07/23/18 1257 07/24/18 0358 07/25/18 0446  AST 15 13* 15  ALT 13 11 9   ALKPHOS 71 64 60  BILITOT 0.7 0.6 0.8  PROT 7.6 6.4* 6.6  ALBUMIN 4.2 3.6 3.6   No results for input(s): LIPASE, AMYLASE in the last 168 hours. No results for input(s): AMMONIA in the last 168 hours. Coagulation Profile: Recent Labs  Lab 07/23/18 1857  INR 1.0   Cardiac Enzymes: Recent Labs  Lab 07/23/18 1857 07/23/18 2151 07/24/18 0358 07/24/18 0949  TROPONINI <0.03 <0.03 <0.03 <0.03   BNP (last 3 results) No results for input(s): PROBNP in the last 8760 hours. HbA1C: No results for input(s): HGBA1C in the last 72 hours. CBG: No results for input(s): GLUCAP in the last 168 hours. Lipid  Profile: No results for input(s): CHOL, HDL, LDLCALC, TRIG, CHOLHDL, LDLDIRECT in the last 72 hours. Thyroid Function Tests: Recent Labs    07/24/18 0358  TSH  1.484   Anemia Panel: No results for input(s): VITAMINB12, FOLATE, FERRITIN, TIBC, IRON, RETICCTPCT in the last 72 hours. Sepsis Labs: No results for input(s): PROCALCITON, LATICACIDVEN in the last 168 hours.  Recent Results (from the past 240 hour(s))  SARS Coronavirus 2 (CEPHEID - Performed in [place] hospital lab), Hosp Order     Status: None   Collection Time: 07/23/18  6:57 PM   Specimen: Nasopharyngeal Swab  Result Value Ref Range Status   SARS Coronavirus 2 NEGATIVE NEGATIVE Final    Comment: (NOTE) If result is NEGATIVE SARS-CoV-2 target nucleic acids are NOT DETECTED. The SARS-CoV-2 RNA is generally detectable in upper and lower  respiratory specimens during the acute phase of infection. The lowest  concentration of SARS-CoV-2 viral copies this assay can detect is 250  copies / mL. A negative result does not preclude SARS-CoV-2 infection  and should not be used as the sole basis for treatment or other  patient management decisions.  A negative result may occur with  improper specimen collection / handling, submission of specimen other  than nasopharyngeal swab, presence of viral mutation(s) within the  areas targeted by this assay, and inadequate number of viral copies  (<250 copies / mL). A negative result must be combined with clinical  observations, patient history, and epidemiological information. If result is POSITIVE SARS-CoV-2 target nucleic acids are DETECTED. The SARS-CoV-2 RNA is generally detectable in upper and lower  respiratory specimens dur ing the acute phase of infection.  Positive  results are indicative of active infection with SARS-CoV-2.  Clinical  correlation with patient history and other diagnostic information is  necessary to determine patient infection status.  Positive results do  not rule out bacterial infection or co-infection with other viruses. If result is PRESUMPTIVE POSTIVE SARS-CoV-2 nucleic acids MAY BE PRESENT.   A presumptive  positive result was obtained on the submitted specimen  and confirmed on repeat testing.  While 2019 novel coronavirus  (SARS-CoV-2) nucleic acids may be present in the submitted sample  additional confirmatory testing may be necessary for epidemiological  and / or clinical management purposes  to differentiate between  SARS-CoV-2 and other Sarbecovirus currently known to infect humans.  If clinically indicated additional testing with an alternate test  methodology [phone]) is advised. The SARS-CoV-2 RNA is generally  detectable in upper and lower respiratory sp ecimens during the acute  phase of infection. The expected result is Negative. Fact Sheet for Patients:  StrictlyIdeas.no Fact Sheet for Healthcare Providers: BankingDealers.co.za This test is not yet approved or cleared by the [place] FDA and has been authorized for detection and/or diagnosis of SARS-CoV-2 by FDA under an Emergency Use Authorization (EUA).  This EUA will remain in effect (meaning this test can be used) for the duration of the COVID-19 declaration under Section 564(b)(1) of the Act, 21 U.S.C. section 360bbb-3(b)(1), unless the authorization is terminated or revoked sooner. Performed at [facility], [place] [address], [place], [place] [address]       Radiology Studies: Dg Chest Port 1 View  Result Date: 07/25/2018 CLINICAL DATA:  Shortness of breath. EXAM: PORTABLE CHEST 1 VIEW COMPARISON:  CT 07/23/2018.  Chest x-ray 07/23/2018. FINDINGS: Persistent mediastinal and left hilar fullness consistent with known adenopathy. Persistent left mid lung field pleural-based density again noted. Left base atelectasis/infiltrate  and left-sided pleural effusion again noted. Degenerative change thoracic spine. IMPRESSION: 1. Persistent mediastinal fullness and left hilar fullness consistent known adenopathy. Persistent left mid lung pleural-based density  again noted. 2. Left base atelectasis/infiltrate and left-sided pleural effusion again noted. Similar findings noted on prior exam. Electronically Signed   By: Jeremy Chan  Register   On: 07/25/2018 07:14   Vas [place] Lower Extremity Venous (dvt)  Result Date: 07/24/2018  Lower Venous Study Indications: Pulmonary embolism.  Risk Factors: Confirmed PE Cancer metastatic cancer-renal, lung, bone, pelvis, soft tissue. Comparison Study: No prior study on file for comparison Performing Technologist: Sharion Dove RVS  Examination Guidelines: A complete evaluation includes B-mode imaging, spectral Doppler, color Doppler, and power Doppler as needed of all accessible portions of each vessel. Bilateral testing is considered an integral part of a complete examination. Limited examinations for reoccurring indications may be performed as noted.  +---------+---------------+---------+-----------+----------+-------+  RIGHT     Compressibility Phasicity Spontaneity Properties Summary  +---------+---------------+---------+-----------+----------+-------+  CFV       Full            Yes       Yes                             +---------+---------------+---------+-----------+----------+-------+  SFJ       Full                                                      +---------+---------------+---------+-----------+----------+-------+  FV Prox   Full                                                      +---------+---------------+---------+-----------+----------+-------+  FV Mid    Full                                                      +---------+---------------+---------+-----------+----------+-------+  FV Distal Full                                                      +---------+---------------+---------+-----------+----------+-------+  PFV       Full                                                      +---------+---------------+---------+-----------+----------+-------+  POP       Full            Yes       Yes                              +---------+---------------+---------+-----------+----------+-------+  PTV       Full                                                      +---------+---------------+---------+-----------+----------+-------+  PERO      Full                                                      +---------+---------------+---------+-----------+----------+-------+   +---------+---------------+---------+-----------+----------+-------------------+  LEFT      Compressibility Phasicity Spontaneity Properties Summary              +---------+---------------+---------+-----------+----------+-------------------+  CFV       Full            Yes       Yes                                         +---------+---------------+---------+-----------+----------+-------------------+  SFJ       Full                                                                  +---------+---------------+---------+-----------+----------+-------------------+  FV Prox   Full                                                                  +---------+---------------+---------+-----------+----------+-------------------+  FV Mid    Full                                                                  +---------+---------------+---------+-----------+----------+-------------------+  FV Distal Full                                                                  +---------+---------------+---------+-----------+----------+-------------------+  PFV       Full                                                                  +---------+---------------+---------+-----------+----------+-------------------+  POP       Full            Yes       Yes                    sluggish flow noted  +---------+---------------+---------+-----------+----------+-------------------+  PTV       Full                                                                  +---------+---------------+---------+-----------+----------+-------------------+  PERO      Full                                                                   +---------+---------------+---------+-----------+----------+-------------------+     Summary: Right: There is no evidence of deep vein thrombosis in the lower extremity. Left: There is no evidence of deep vein thrombosis in the lower extremity.  *See table(s) above for measurements and observations. Electronically signed by Jeremy Snare MD on 07/24/2018 at 4:11:08 PM.    Final    Scheduled Meds:  aspirin EC  81 mg Oral Daily   atorvastatin  40 mg Oral Daily   levothyroxine  75 mcg Oral QAC breakfast   predniSONE  5 mg Oral Q breakfast   Continuous Infusions:  heparin Stopped (07/25/18 1800)    LOS: 1 day   Kerney Elbe, DO [facility] PAGER is on [place]  If 7PM-7AM, please contact night-coverage [email or web address] Password [facility] 07/25/2018, 6:40 PM

## 2018-07-25 NOTE — Plan of Care (Signed)
  Problem: Education: Goal: Knowledge of General Education information will improve Description: Including pain rating scale, medication(s)/side effects and non-pharmacologic comfort measures Outcome: Progressing   Problem: Health Behavior/Discharge Planning: Goal: Ability to manage health-related needs will improve Outcome: Progressing   Problem: Clinical Measurements: Goal: Diagnostic test results will improve Outcome: Progressing Goal: Respiratory complications will improve Outcome: Progressing Goal: Cardiovascular complication will be avoided Outcome: Progressing   Problem: Coping: Goal: Level of anxiety will decrease Outcome: Progressing   Problem: Elimination: Goal: Will not experience complications related to urinary retention Outcome: Progressing   Problem: Safety: Goal: Ability to remain free from injury will improve Outcome: Progressing

## 2018-07-25 NOTE — TOC Progression Note (Signed)
Transition of Care [facility]) - Progression Note    Patient Details  Name: [name] MRN: [id number] Date of Birth: 1940-10-13  Transition of Care [facility] CM/SW Contact  Thai Hemrick, Juliann Pulse, RN Phone Number: 07/25/2018, 12:40 PM  Clinical Narrative:  Patient does not have script coverage. CM can provide the [facility] program with over ride for lovenox/generic enoxaparin @ d/c-MD updated.          Expected Discharge Plan and Services                                                 Social Determinants of Health (SDOH) Interventions    Readmission Risk Interventions No flowsheet data found.

## 2018-07-25 NOTE — Progress Notes (Signed)
[place] for heparin drip Indication: pulmonary embolus  Allergies  Allergen Reactions  . No Known Allergies     Patient Measurements: Height: 5' 9.5" (176.5 cm) Weight: 195 lb (88.5 kg) IBW/kg (Calculated) : 71.85 Height: 176.5 cm Weight: 88.5 kg Heparin Dosing Weight = TBW = 88.5 kg  Vital Signs: Temp: 99 F (37.2 C) (06/19 0644) Temp Source: Oral (06/19 0644) BP: 127/64 (06/19 0644) Pulse Rate: 76 (06/19 0644)  Labs: Recent Labs    07/23/18 1257  07/23/18 1857 07/23/18 2151  07/24/18 0358 07/24/18 0949 07/24/18 1223 07/24/18 2158 07/25/18 0446  HGB 12.2*  --   --   --   --  12.3*  --   --   --  11.8*  HCT 38.1*  --   --   --   --  37.7*  --   --   --  38.0*  PLT 155  --   --   --   --  145*  --   --   --  135*  APTT  --   --  29  --   --   --   --   --   --   --   LABPROT  --   --  13.2  --   --   --   --   --   --   --   INR  --   --  1.0  --   --   --   --   --   --   --   HEPARINUNFRC  --   --   --   --    < > 0.73*  --  0.81* 0.46 0.47  CREATININE 1.39*  --   --   --   --  1.20  --   --   --  1.11  TROPONINI  --    < > <0.03 <0.03  --  <0.03 <0.03  --   --   --    < > = values in this interval not displayed.    CrCl = 60 mL/min  Assessment: Pt has PMH significant for metastatic cancer; presenting with SOB. Pt found to have PE and pharmacy consulted to dose/monitor heparin infusion. Pt was not on anticoagulants PTA. Baseline labs WNL.  Today, 07/25/18  Heparin level is therapeutic at 0.47 on heparin infusion at 1200 units/hr.    CBC: Hg/pltc slightly low trending down (pltc 155>135)    No bleeding or infusion related issues reported by RN.   Goal of Therapy:  Heparin level 0.3-0.7 units/ml Monitor platelets by anticoagulation protocol: Yes   Plan:   Continue heparin infusion at 1200 units/hr   Monitor for signs/symptoms of bleeding  At 6pm, D/C heparin and transition to Lovenox 63m sq Q12h  Lovenox  discharge kit.  RN to provide administration education.  MNetta Cedars PharmD, BCPS 07/25/2018 7:09 AM

## 2018-07-26 ENCOUNTER — Inpatient Hospital Stay (HOSPITAL_COMMUNITY): Payer: Medicare Other

## 2018-07-26 DIAGNOSIS — E230 Hypopituitarism: Secondary | ICD-10-CM

## 2018-07-26 DIAGNOSIS — C641 Malignant neoplasm of right kidney, except renal pelvis: Secondary | ICD-10-CM

## 2018-07-26 DIAGNOSIS — E860 Dehydration: Secondary | ICD-10-CM

## 2018-07-26 DIAGNOSIS — I2699 Other pulmonary embolism without acute cor pulmonale: Principal | ICD-10-CM

## 2018-07-26 LAB — CBC WITH DIFFERENTIAL/PLATELET
Abs Immature Granulocytes: 0.14 10*3/uL — ABNORMAL HIGH (ref 0.00–0.07)
Basophils Absolute: 0 10*3/uL (ref 0.0–0.1)
Basophils Relative: 0 %
Eosinophils Absolute: 0.1 10*3/uL (ref 0.0–0.5)
Eosinophils Relative: 1 %
HCT: 34.8 % — ABNORMAL LOW (ref 39.0–52.0)
Hemoglobin: 11.1 g/dL — ABNORMAL LOW (ref 13.0–17.0)
Immature Granulocytes: 1 %
Lymphocytes Relative: 9 %
Lymphs Abs: 1.4 10*3/uL (ref 0.7–4.0)
MCH: 32.3 pg (ref 26.0–34.0)
MCHC: 31.9 g/dL (ref 30.0–36.0)
MCV: 101.2 fL — ABNORMAL HIGH (ref 80.0–100.0)
Monocytes Absolute: 1.9 10*3/uL — ABNORMAL HIGH (ref 0.1–1.0)
Monocytes Relative: 12 %
Neutro Abs: 12.8 10*3/uL — ABNORMAL HIGH (ref 1.7–7.7)
Neutrophils Relative %: 77 %
Platelets: 145 10*3/uL — ABNORMAL LOW (ref 150–400)
RBC: 3.44 MIL/uL — ABNORMAL LOW (ref 4.22–5.81)
RDW: 14.1 % (ref 11.5–15.5)
WBC: 16.4 10*3/uL — ABNORMAL HIGH (ref 4.0–10.5)
nRBC: 0 % (ref 0.0–0.2)

## 2018-07-26 LAB — IRON AND TIBC
Iron: 24 ug/dL — ABNORMAL LOW (ref 45–182)
Saturation Ratios: 11 % — ABNORMAL LOW (ref 17.9–39.5)
TIBC: 212 ug/dL — ABNORMAL LOW (ref 250–450)
UIBC: 188 ug/dL

## 2018-07-26 LAB — FERRITIN: Ferritin: 1026 ng/mL — ABNORMAL HIGH (ref 24–336)

## 2018-07-26 LAB — VITAMIN B12: Vitamin B-12: 222 pg/mL (ref 180–914)

## 2018-07-26 LAB — COMPREHENSIVE METABOLIC PANEL
ALT: 11 U/L (ref 0–44)
AST: 12 U/L — ABNORMAL LOW (ref 15–41)
Albumin: 3.3 g/dL — ABNORMAL LOW (ref 3.5–5.0)
Alkaline Phosphatase: 58 U/L (ref 38–126)
Anion gap: 12 (ref 5–15)
BUN: 19 mg/dL (ref 8–23)
CO2: 20 mmol/L — ABNORMAL LOW (ref 22–32)
Calcium: 8.5 mg/dL — ABNORMAL LOW (ref 8.9–10.3)
Chloride: 102 mmol/L (ref 98–111)
Creatinine, Ser: 1.27 mg/dL — ABNORMAL HIGH (ref 0.61–1.24)
GFR calc Af Amer: >60 mL/min (ref >60)
GFR calc non Af Amer: 53 mL/min — ABNORMAL LOW (ref >60)
Glucose, Bld: 107 mg/dL — ABNORMAL HIGH (ref 70–99)
Potassium: 4 mmol/L (ref 3.5–5.1)
Sodium: 134 mmol/L — ABNORMAL LOW (ref 135–145)
Total Bilirubin: 1.1 mg/dL (ref 0.3–1.2)
Total Protein: 6.7 g/dL (ref 6.5–8.1)

## 2018-07-26 LAB — RETICULOCYTES
Immature Retic Fract: 16.2 % — ABNORMAL HIGH (ref 2.3–15.9)
RBC.: 3.44 MIL/uL — ABNORMAL LOW (ref 4.22–5.81)
Retic Count, Absolute: 37.8 10*3/uL (ref 19.0–186.0)
Retic Ct Pct: 1.1 % (ref 0.4–3.1)

## 2018-07-26 LAB — HEPARIN LEVEL (UNFRACTIONATED)
Heparin Unfractionated: 0.21 IU/mL — ABNORMAL LOW (ref 0.30–0.70)
Heparin Unfractionated: 0.34 IU/mL (ref 0.30–0.70)
Heparin Unfractionated: 0.3 IU/mL (ref 0.30–0.70)

## 2018-07-26 LAB — MAGNESIUM: Magnesium: 2 mg/dL (ref 1.7–2.4)

## 2018-07-26 LAB — PROCALCITONIN: Procalcitonin: 0.5 ng/mL

## 2018-07-26 LAB — FOLATE: Folate: 4.6 ng/mL — ABNORMAL LOW (ref >5.9)

## 2018-07-26 LAB — PHOSPHORUS: Phosphorus: 2.5 mg/dL (ref 2.5–4.6)

## 2018-07-26 MED ORDER — SODIUM CHLORIDE 0.9 % IV SOLN
INTRAVENOUS | Status: DC
  Administered 2018-07-26 – 2018-07-29 (×5): via INTRAVENOUS

## 2018-07-26 MED ORDER — SODIUM CHLORIDE 0.9 % IV BOLUS
500.0000 mL | Freq: Once | INTRAVENOUS | Status: AC
  Administered 2018-07-26: 07:00:00 500 mL via INTRAVENOUS

## 2018-07-26 NOTE — Progress Notes (Signed)
[place] for heparin drip Indication: pulmonary embolus  Allergies  Allergen Reactions  . No Known Allergies     Patient Measurements: Height: 5' 9.5" (176.5 cm) Weight: 195 lb (88.5 kg) IBW/kg (Calculated) : 71.85 Height: 176.5 cm Weight: 88.5 kg Heparin Dosing Weight = TBW = 88.5 kg  Vital Signs: Temp: 98.7 F (37.1 C) (06/20 2156) Temp Source: Oral (06/20 2156) BP: 125/71 (06/20 2156) Pulse Rate: 93 (06/20 2156)  Labs: Recent Labs    07/24/18 0358 07/24/18 0949  07/25/18 0446 07/26/18 0525 07/26/18 1243 07/26/18 2158  HGB 12.3*  --   --  11.8* 11.1*  --   --   HCT 37.7*  --   --  38.0* 34.8*  --   --   PLT 145*  --   --  135* 145*  --   --   HEPARINUNFRC 0.73*  --    < > 0.47 0.21* 0.34 0.30  CREATININE 1.20  --   --  1.11 1.27*  --   --   TROPONINI <0.03 <0.03  --   --   --   --   --    < > = values in this interval not displayed.    CrCl = 52 mL/min  Assessment: Pt has PMH significant for metastatic cancer; presenting with SOB. Pt found to have PE and pharmacy consulted to dose/monitor heparin infusion. Pt was not on anticoagulants PTA. Baseline labs WNL. Significant events:  MRI head 6/19 showed no intracranial metastatic disease or bleeding.  Today, 07/26/18   Confirmatory Heparin level therapeutic at 0.3 on rate of 1400 units/hr    RN reports patient continues to describe coughing up small amounts of bloody sputum.  Unlikely this is related to anticoagulation per pulmonologist.  No infusion related issues reported.   Goal of Therapy:  Heparin level 0.3-0.7 units/ml Monitor platelets by anticoagulation protocol: Yes   Plan:  Increase heparin infusion to 1450 units/hr.  Daily heparin level & CBC while on heparin  F/U plan re: transition to apixaban.  Eudelia Bunch, Pharm.D [phone] 07/27/2018 12:07 AM

## 2018-07-26 NOTE — Progress Notes (Signed)
Pt has been encouraged several times to ambulate today or get out of bed to chair, but is very limited in his movement d/t pain to right chest/back. Vicodin is effective but is still too severe to get out of bed. Pt requests to wait until tomorrow. MD [facility] aware. Pt sitting on edge of bed at this time, eating dinner. Will continue to monitor.

## 2018-07-26 NOTE — Progress Notes (Signed)
PROGRESS NOTE    Jeremy Chan  TMH:[id number] DOB: [birth year] DOA: 07/23/2018 PCP: Haze Rushing, MD   Brief Narrative:  HPI per [name] on 07/23/2018 Jeremy Chan is a 78 y.o. 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, Pan-hypopitutarism    Presented with less of breath worse than his baseline and worse with exertion he got suddenly very short of breath today when he was trying to clean off the urine on the floor when his dog had an accident.  When EMS arrived his oxygen saturation was down to 92% he was placed on nonrebreather secondary to increased work of breathing although by that time his oxygen saturation was 95% room air He has had decreased appetite and fatigue which is been chronic since his diagnosis of cancer having had significant coughing no fevers no chest pain he denies any leg swelling   Recently seen by his oncologist CT 07/16/18 showed enlargement of a right renal neoplasm w/d interval extension into the R renal vein and IVC, enlargement of multiple necrotic mediastinal lymph node conglomerates, and increased nodularity chest and abdomen. Stable multiple sclerotic and lytic foci of osseous metastatic dise.  Plan is to hold immunotherapy w/ switch to cabozantinib   recent CT chest/abd/pelvis from 07/16/2018  Showed  progression of disease including enlargement of a right renal neoplasm with interval extension into the right renal vein and inferior vena cava, enlargement of multiple necrotic mediastinal lymph node conglomerates, and enlarging nodularity involving  the chest and abdomen   **Interim History Patient remains on a heparin drip and case was discussed with In house oncology who recommends full dose anticoagulation with Lovenox at discharge I spoke with the patient's primary oncologist who recommends either way starting him on a anticoagulant and is okay with doing a NOAC.Marland Kitchen  Echocardiogram was done and not show any right heart strain and no evidence of any right systolic dysfunction and lower extremity duplex was done and was negative for VTE.  After discussion with his primary oncologist [name], he recommends obtaining an MRI and continue heparin drip for now before transitioning to rule out any metastatic lesions with hemorrhagic transformation in the brain.  MRI done and was within limits for detection on a noncontrast examination but showed no intracranial metastatic disease.  Atrophy with chronic microvascular ischemic change was seen and slight tonsillar ectopia but there is no intracranial mass lesion or acute stroke noted as well.  Overnight the patient became hypotensive was given a fluid bolus and was then started back on IV fluids.  Pulmonary was consulted for his hemoptysis and pulmonary embolus and because of the submassive size [name] recommending continuing heparin drip and transitioning to NOAC.  [name] evaluated for his hemoptysis and likely thought it may be secondary to PE for anticoagulation and was minimal and recommends continuing the anticoagulation.  Currently the patient is not a candidate for thrombolytics.  Assessment & Plan:   Principal Problem:   Pulmonary embolism (HCC) Active Problems:   Hyperlipidemia   Essential hypertension   Renal cell cancer, right (HCC)   CAD (coronary artery disease)   Dehydration   AKI (acute kidney injury) ([place])  Hypothyroidism   Hypopituitarism ([place])  Acute Pulmonary Embolism and likely submassive -Setting of hypercoagulable state secondary to his malignancies -Admitted to  inpatient telemetry and will continue telemetry monitoring -Cycle cardiac troponins were less than 0.034 -CTA of the chest showed positive acute PE essentially within the main right pulmonary artery with extension to the right middle lobe and right lower lobe pulmonary arteries.  There is CT evidence of right heart strain with RV/LV ratio of 1.3 consistent with a recent submassive PE -Started on heparin drip and will continue for now -Discussed with medical oncology [name] who recommends transitioning to Lovenox at discharge 1 mg/kg twice daily at discharge but will continue heparin drip for at least 48 hours currently; after further discussion and discussion with his primary oncologist [name] at [facility], [name] recommends starting the patient on a NOAC at D/C as he does not have a Preference but recommends obtaining an MRI of the brain given patient's worsened metastatic disease -Continue to hold home blood pressure medications and avoid hypotension -Echocardiogram done and showed a normal systolic function with a EF of 55 to 60% with cavity size that was normal.  There is no increase in left ventricular wall thickness and left ventricular diastolic parameters were consistent with impaired relaxation the right ventricle has a normal systolic function -No evidence of right heart strain or systolic dysfunction on echocardiogram -Lower extremity Dopplers were negative for VTE -Have consulted case management for cost of full dose Lovenox 1 milligram per kilogram and this is on patient's formulary and after discussion with patient's primary oncologist he recommends starting a NOAC likely apixaban at discharge but prior to this will obtain MRI to evaluate the brain for any metastatic lesions given concern for any hemorrhagic transformation -MRI was negative as above and pulmonary evaluated today and recommends continuing anticoagulation at this time and transition to [place] -Patient had 2  small sputum's that were bloody and this is likely partial clot and may be related to anticoagulation but pulmonary evaluated recommending continue anticoagulation at this time -We will need home ambulatory screen prior to discharge  Acute Respiratory Failure with hypoxia secondary to above -Patient was placed on 2 L of supplemental oxygen via nasal cannula -Continue pulse oximetry and maintain O2 saturation greater than 92% -Continue with supplemental oxygen via nasal cannula and wean O2 as tolerated -Repeat chest x-ray this morning showed persistent mediastinal fullness in the left hilar fullness consistent with known adenopathy.  There is persistent left midlung pleural-based density again and there is also left base atelectasis and infiltrate as well as left-sided pleural effusion again noted -in the setting of PE -We will need home Ambulatory screen prior to discharge and unable to be done today due to patient's  Hyperlipidemia  -Stable continue home medications with Atorvastatin 40 g p.o. nightly  Essential Hypertension blood currently now hypotensive -Continue hold home medications with Amlodipine-Benazepril Combination given PE and AKI -Given a bolus of IV fluids overnight and started back on maintenance fluids at 75 mL's per hour  Metastatic Renal cell cancer, Right Hx of Prostate Cancer -Followed at [facility] -Renal cancer has metastasized to the lung, the bone, the pelvis as well as soft tissue; patient is currently on nivolumab -Also had a history of laryngeal cancer that involved bilateral vocal cord status post radiation and history of prostate cancers treated with brachytherapy implantations over 2000 -Currently have change in immunotherapy given failure of a previous treatment and progression have not started  new therapy yet -CTA showed worsening metastatic disease as evidenced by multiple pathologically enlarged mediastinal lymph nodes in addition to enlarging  sub-pleural pulmonary nodules on the left and metastatic deposit that appears to be centered within or adjacent to the left hemidiaphragm -I spoke with [name] his primary oncologist who is planning on starting a new immunotherapy at discharge but [name] was concerned about possible metastatic lesions to the brain given his worsening metastatic disease acutely so he recommends obtaining an MRI of the brain without contrast for now to make sure the patient does not have any metastatic lesions to the brain with possible bleeding as he is on anticoagulation -MRI was done and within limits for detection on a noncontrast examination but showed no intracranial metastatic disease.  Atrophy with chronic microvascular ischemic change was seen and slight tonsillar ectopia but there is no intracranial mass lesion or acute stroke noted as well.   CAD  with history of stents in the early 2000 -Stable continue home medications with ASA 81 mg po Daily and Atorvastatin 40 mg po Daily   Dehydration  -IV fluid hydration is now stopped but now will resume as the patient received a bolus overnight due to his hypotension -BUN/Cr was improving slightly and went from 24/1.39 -> 20/1.20 -> 20/1.11 -> 19/1.27  AKI (acute kidney injury) ([place]) most likely secondary to dehydration -We will bolus overnight and IV fluid hydration is now resumed at 75 mL's per hour -BUN/Cr went from 24/1.39 -> 20/1.20 -> 20/1.11 -> 19/1.27 -Continue to Monitor and follow Renal Fxn -Avoid Nephrotoxic Medications if possible   Pan-Hypopituitarism Hypothyroidism   -Continue Home Prednisone and Synthroid -Patient sees Dr. Herschel Senegal in endocrinology and also receives testosterone every 2 weeks  Macrocytic Anemia -Patient's Hb/Hct went from 12.2/38.1 -> 12.3/37.7 -> 11.8/38.0 -> 11.1/34.8 -Checked Anemia Panel and showed an iron level of 24, U IBC of 188, TIBC 212, saturation ratios of 11%, ferritin level 1026, folate level 4.6, and  vitamin B12 level 222 -Continue to Monitor for S/Sx of Bleeding as patient is anticoagulated with Heparin gtt; Currently no overt signs of bleeding -Repeat CBC in AM   Thrombocytopenia Patient's platelet count went from 155,000 and is now 145,000 -> 135,000 -> 145,000 -Continue to monitor for signs and symptoms of bleeding; currently no overt bleeding noted -Continue monitor carefully as patient is on a heparin drip -Repeat CBC in a.m.  Leukocytosis -Likely reactive in the setting of PE -Was Trending downward as patient's WBC went from 13.5 is now 10.6 but now is trending back up again and is 13.6 -> 16.4 -Continue monitor for signs and symptoms of infection -Repeat CBC in a.m.  Hyponatremia -Patient's sodium was 134 -Resumed IV fluid hydration at 75 mils per hour and then will repeat CMP in a.m.  Metabolic Acidosis -Patient CO2 was 20 and anion gap was 12 -Continue with IV fluid hydration as above -Continue monitor and trend and repeat CMP in a.m.  DVT prophylaxis: Anticoagulated with Heparin gtt and will transition to NOAC in the next few days  Code Status: FULL CODE  Family Communication: No family present at bedside but I spoke with the son over the phone Disposition Plan: Remain Inpatient for continued Workup and treatment and Anticipate D/C in the next 24-48 hours if medically stable and off of heparin drip and transition to [place]  Consultants:   None but case was discussed with Medical Oncology [name]  Case was also discussed with [facility] oncology  [name]  Pulmonary [name]   Procedures:  ECHOCARDIOGRAM 07/23/2018 IMPRESSIONS    1. The left ventricle has normal systolic function, with an ejection fraction of 55-60%. The cavity size was normal. Left ventricular diastolic Doppler parameters are consistent with impaired relaxation.  2. The right ventricle has normal systolic function. The cavity was normal. There is no increase in right  ventricular wall thickness.  FINDINGS  Left Ventricle: The left ventricle has normal systolic function, with an ejection fraction of 55-60%. The cavity size was normal. There is no increase in left ventricular wall thickness. Left ventricular diastolic Doppler parameters are consistent with  impaired relaxation.  Right Ventricle: The right ventricle has normal systolic function. The cavity was normal. There is no increase in right ventricular wall thickness.  Left Atrium: Left atrial size was normal in size.  Right Atrium: Right atrial size was normal in size. Right atrial pressure is estimated at 10 mmHg.  Interatrial Septum: No atrial level shunt detected by color flow Doppler.  Pericardium: There is no evidence of pericardial effusion.  Mitral Valve: The mitral valve is normal in structure. Mitral valve regurgitation is not visualized by color flow Doppler.  Tricuspid Valve: The tricuspid valve is normal in structure. Tricuspid valve regurgitation was not visualized by color flow Doppler.  Aortic Valve: The aortic valve is normal in structure. Aortic valve regurgitation was not visualized by color flow Doppler.  Pulmonic Valve: The pulmonic valve was normal in structure. Pulmonic valve regurgitation is not visualized by color flow Doppler.  Venous: The inferior vena cava is normal in size with greater than 50% respiratory variability.    +--------------+--------++  LEFT VENTRICLE            +----------------+---------++ +--------------+--------++  Diastology                    PLAX 2D                   +----------------+---------++ +--------------+--------++  LV e' lateral:   9.63 cm/s    LVIDd:         5.15 cm    +----------------+---------++ +--------------+--------++  LV E/e' lateral: 6.4          LVIDs:         3.04 cm    +----------------+---------++ +--------------+--------++  LV e' medial:    7.94 cm/s    LV PW:         1.04 cm     +----------------+---------++ +--------------+--------++  LV E/e' medial:  7.8          LV IVS:        0.75 cm    +----------------+---------++ +--------------+--------++  LVOT diam:     2.00 cm    +--------------+--------++  LV SV:         90 ml      +--------------+--------++  LV SV Index:   43.06      +--------------+--------++  LVOT Area:     3.14 cm   +--------------+--------++                            +--------------+--------++  +---------------+----------++  RIGHT VENTRICLE              +---------------+----------++  RV S prime:     11.20 cm/s   +---------------+----------++  +---------------+-------++-----------++  LEFT ATRIUM              Index         +---------------+-------++-----------++  LA diam:        3.50 cm  1.70 cm/m    +---------------+-------++-----------++  LA Vol (A2C):   29.3 ml  14.26 ml/m   +---------------+-------++-----------++  LA Vol (A4C):   42.4 ml  20.64 ml/m   +---------------+-------++-----------++  LA Biplane Vol: 36.4 ml  17.72 ml/m   +---------------+-------++-----------++  +------------+-----------++  AORTIC VALVE               +------------+-----------++  LVOT Vmax:   70.90 cm/s    +------------+-----------++  LVOT Vmean:  46.900 cm/s   +------------+-----------++  LVOT VTI:    0.157 m       +------------+-----------++   +-------------+-------++  AORTA                   +-------------+-------++  Ao Root diam: 3.10 cm   +-------------+-------++  +--------------+----------++  MITRAL VALVE                +--------------+-------+ +--------------+----------++  SHUNTS                   MV Area (PHT): 2.73 cm     +--------------+-------+ +--------------+----------++  Systemic VTI:  0.16 m    MV PHT:        80.62 msec   +--------------+-------+ +--------------+----------++  Systemic Diam: 2.00 cm   MV Decel Time: 278 msec     +--------------+-------+ +--------------+----------++ +--------------+----------++  MV E  velocity: 61.60 cm/s   +--------------+----------++  MV A velocity: 75.70 cm/s   +--------------+----------++  MV E/A ratio:  0.81         +--------------+----------++   Antimicrobials:  Anti-infectives (From admission, onward)   None     Subjective: Seen and examined at bedside again he was being stated severely weak and tired but felt better today than he did yesterday after talking with the pulmonologist.  He was encouraged to ambulate but was limited due to pain in the right chest and back.  Denied any nausea or vomiting but feels that he needs his oxygen as it helps him breathe better.  No other concerns or complaints at this time and understands the plan of care currently.  Objective: Vitals:   07/26/18 0813 07/26/18 1214 07/26/18 1245 07/26/18 1318  BP: (!) 101/59 (!) 104/57  (!) 108/57  Pulse: 76 80  84  Resp: 16 18  16   Temp: 98 F (36.7 C) 98.1 F (36.7 C)  98.2 F (36.8 C)  TempSrc: Oral Oral  Oral  SpO2: 99% 100% 98%   Weight:      Height:        Intake/Output Summary (Last 24 hours) at 07/26/2018 1754 Last data filed at 07/26/2018 1744 Gross per 24 hour  Intake 1276.41 ml  Output 550 ml  Net 726.41 ml   Filed Weights   07/23/18 1824  Weight: 88.5 kg   Examination: Physical Exam:  Constitutional: Chronically ill-appearing Caucasian male who is overweight and in mild respiratory distress and appears uncomfortable and anxious Eyes: Lids and conjunctive are normal.  Sclera anicteric ENMT: External ears nose appear normal.  Grossly normal hearing. Neck: Appears supple no JVD Respiratory: Diminished to auscultation bilaterally with no appreciable wheezing, rales, rhonchi but worsening breath sounds on the right compared to left.  Patient has unlabored breathing but is wearing supplemental oxygen via nasal cannula Cardiovascular: Regular rate and rhythm.  Has a 2/6 systolic murmur.  Has trace extremity edema Abdomen: Soft, nontender, distended second body  habitus.  Bowel sounds present  GU: Deferred Musculoskeletal: No contractures or cyanosis.  No joint deformities in upper lower extremities noted Skin: Appears warm and dry no appreciable rashes or lesions on to skin evaluation Neurologic: Cranial nerves II through XII gross intact no appreciable focal deficits.  Romberg sign cerebellar reflexes were not assessed Psychiatric: Is awake, alert, oriented.  Patient is anxious.  Has a normal judgment and insight  Data Reviewed: I have personally reviewed following labs and imaging studies  CBC: Recent Labs  Lab 07/23/18 1257 07/24/18 0358 07/25/18 0446 07/26/18 0525  WBC 13.5* 10.6* 13.6* 16.4*  NEUTROABS 10.9*  --  10.2* 12.8*  HGB 12.2* 12.3* 11.8* 11.1*  HCT 38.1* 37.7* 38.0* 34.8*  MCV 101.3* 101.3* 102.4* 101.2*  PLT 155 145* 135* 417*   Basic Metabolic Panel: Recent Labs  Lab 07/23/18 1257 07/24/18 0358 07/25/18 0446 07/26/18 0525  NA 137 136 135 134*  K 3.8 4.0 4.5 4.0  CL 102 103 102 102  CO2 25 22 22  20*  GLUCOSE 91 94 93 107*  BUN 24* 20 20 19   CREATININE 1.39* 1.20 1.11 1.27*  CALCIUM 9.0 8.5* 8.5* 8.5*  MG  --  2.3 2.2 2.0  PHOS  --  2.6 2.5 2.5   GFR: Estimated Creatinine Clearance: 52.4 mL/min (A) (by C-G formula based on SCr of 1.27 mg/dL (H)). Liver Function Tests: Recent Labs  Lab 07/23/18 1257 07/24/18 0358 07/25/18 0446 07/26/18 0525  AST 15 13* 15 12*  ALT 13 11 9 11   ALKPHOS 71 64 60 58  BILITOT 0.7 0.6 0.8 1.1  PROT 7.6 6.4* 6.6 6.7  ALBUMIN 4.2 3.6 3.6 3.3*   No results for input(s): LIPASE, AMYLASE in the last 168 hours. No results for input(s): AMMONIA in the last 168 hours. Coagulation Profile: Recent Labs  Lab 07/23/18 1857  INR 1.0   Cardiac Enzymes: Recent Labs  Lab 07/23/18 1857 07/23/18 2151 07/24/18 0358 07/24/18 0949  TROPONINI <0.03 <0.03 <0.03 <0.03   BNP (last 3 results) No results for input(s): PROBNP in the last 8760 hours. HbA1C: No results for input(s):  HGBA1C in the last 72 hours. CBG: No results for input(s): GLUCAP in the last 168 hours. Lipid Profile: No results for input(s): CHOL, HDL, LDLCALC, TRIG, CHOLHDL, LDLDIRECT in the last 72 hours. Thyroid Function Tests: Recent Labs    07/24/18 0358  TSH 1.484   Anemia Panel: Recent Labs    07/26/18 0525  VITAMINB12 222  FOLATE 4.6*  FERRITIN 1,026*  TIBC 212*  IRON 24*  RETICCTPCT 1.1   Sepsis Labs: Recent Labs  Lab 07/26/18 0926  PROCALCITON 0.50    Recent Results (from the past 240 hour(s))  SARS Coronavirus 2 (CEPHEID - Performed in [place] hospital lab), Hosp Order     Status: None   Collection Time: 07/23/18  6:57 PM   Specimen: Nasopharyngeal Swab  Result Value Ref Range Status   SARS Coronavirus 2 NEGATIVE NEGATIVE Final    Comment: (NOTE) If result is NEGATIVE SARS-CoV-2 target nucleic acids are NOT DETECTED. The SARS-CoV-2 RNA is generally detectable in upper and lower  respiratory specimens during the acute phase of infection. The lowest  concentration of SARS-CoV-2 viral copies this assay can detect is 250  copies / mL. A negative result does not preclude SARS-CoV-2 infection  and should not be used as the sole basis for treatment or other  patient management decisions.  A negative result may occur with  improper specimen collection / handling, submission of specimen  other  than nasopharyngeal swab, presence of viral mutation(s) within the  areas targeted by this assay, and inadequate number of viral copies  (<250 copies / mL). A negative result must be combined with clinical  observations, patient history, and epidemiological information. If result is POSITIVE SARS-CoV-2 target nucleic acids are DETECTED. The SARS-CoV-2 RNA is generally detectable in upper and lower  respiratory specimens dur ing the acute phase of infection.  Positive  results are indicative of active infection with SARS-CoV-2.  Clinical  correlation with patient history and  other diagnostic information is  necessary to determine patient infection status.  Positive results do  not rule out bacterial infection or co-infection with other viruses. If result is PRESUMPTIVE POSTIVE SARS-CoV-2 nucleic acids MAY BE PRESENT.   A presumptive positive result was obtained on the submitted specimen  and confirmed on repeat testing.  While 2019 novel coronavirus  (SARS-CoV-2) nucleic acids may be present in the submitted sample  additional confirmatory testing may be necessary for epidemiological  and / or clinical management purposes  to differentiate between  SARS-CoV-2 and other Sarbecovirus currently known to infect humans.  If clinically indicated additional testing with an alternate test  methodology [id number]) is advised. The SARS-CoV-2 RNA is generally  detectable in upper and lower respiratory sp ecimens during the acute  phase of infection. The expected result is Negative. Fact Sheet for Patients:  StrictlyIdeas.no Fact Sheet for Healthcare Providers: BankingDealers.co.za This test is not yet approved or cleared by the [place] FDA and has been authorized for detection and/or diagnosis of SARS-CoV-2 by FDA under an Emergency Use Authorization (EUA).  This EUA will remain in effect (meaning this test can be used) for the duration of the COVID-19 declaration under Section 564(b)(1) of the Act, 21 U.S.C. section 360bbb-3(b)(1), unless the authorization is terminated or revoked sooner. Performed at [facility], [place] [address] [place], [place] [address]      Radiology Studies: Mr Brain Wo Contrast  Result Date: 07/25/2018 CLINICAL DATA:  Chronic headache.  Cancer. EXAM: MRI HEAD WITHOUT CONTRAST TECHNIQUE: Multiplanar, multiecho pulse sequences of the brain and surrounding structures were obtained without intravenous contrast. COMPARISON:  08/29/2016. FINDINGS: Brain: No evidence for acute  infarction, hemorrhage, mass lesion, hydrocephalus, or extra-axial fluid. Mild atrophy. Mild subcortical and periventricular T2 and FLAIR hyperintensities, likely chronic microvascular ischemic change. Vascular: Flow voids are maintained throughout the carotid, basilar, and vertebral arteries. There are no areas of chronic hemorrhage. Skull and upper cervical spine: Mild tonsillar ectopia. No frank Chiari malformation. No cervical hydromyelia Sinuses/Orbits: No acute findings. Other: LEFT mastoid effusion. Compared with priors, similar appearance. IMPRESSION: Within limits for detection on noncontrast exam, no intracranial metastatic disease. Atrophy with chronic microvascular ischemic change. Slight tonsillar ectopia. No intracranial mass lesion or acute stroke. Electronically Signed   By: Staci Righter M.D.   On: 07/25/2018 20:47   Dg Chest Port 1 View  Result Date: 07/26/2018 CLINICAL DATA:  Dyspnea EXAM: PORTABLE CHEST 1 VIEW COMPARISON:  Chest radiograph from one day prior. FINDINGS: Stable cardiomediastinal silhouette with normal heart size and enlargement of the AP window contour. No pneumothorax. No pleural effusion. Stable elevation of the left hemidiaphragm. No pulmonary edema. Stable nodular peripheral left midlung opacity and left lung base atelectasis. IMPRESSION: 1. Stable AP window fullness compatible with known metastatic adenopathy. 2. Stable nodular peripheral left mid lung opacity compatible with pulmonary metastasis seen on recent chest CT. 3. Stable left lung base atelectasis with elevation of the  left hemidiaphragm. Electronically Signed   By: Ilona Sorrel M.D.   On: 07/26/2018 07:11   Dg Chest Port 1 View  Result Date: 07/25/2018 CLINICAL DATA:  Shortness of breath. EXAM: PORTABLE CHEST 1 VIEW COMPARISON:  CT 07/23/2018.  Chest x-ray 07/23/2018. FINDINGS: Persistent mediastinal and left hilar fullness consistent with known adenopathy. Persistent left mid lung field pleural-based  density again noted. Left base atelectasis/infiltrate and left-sided pleural effusion again noted. Degenerative change thoracic spine. IMPRESSION: 1. Persistent mediastinal fullness and left hilar fullness consistent known adenopathy. Persistent left mid lung pleural-based density again noted. 2. Left base atelectasis/infiltrate and left-sided pleural effusion again noted. Similar findings noted on prior exam. Electronically Signed   By: Craig   On: 07/25/2018 07:14   Scheduled Meds:  aspirin EC  81 mg Oral Daily   atorvastatin  40 mg Oral Daily   levothyroxine  75 mcg Oral QAC breakfast   predniSONE  5 mg Oral Q breakfast   Continuous Infusions:  sodium chloride 75 mL/hr at 07/26/18 1600   heparin 1,400 Units/hr (07/26/18 1600)    LOS: 2 days   Kerney Elbe, DO [facility] PAGER is on AMION  If 7PM-7AM, please contact night-coverage [email or web address] Password [facility] 07/26/2018, 5:54 PM

## 2018-07-26 NOTE — Progress Notes (Signed)
[place] for heparin drip Indication: pulmonary embolus  Allergies  Allergen Reactions  . No Known Allergies     Patient Measurements: Height: 5' 9.5" (176.5 cm) Weight: 195 lb (88.5 kg) IBW/kg (Calculated) : 71.85 Height: 176.5 cm Weight: 88.5 kg Heparin Dosing Weight = TBW = 88.5 kg  Vital Signs: Temp: 98 F (36.7 C) (06/20 0541) Temp Source: Oral (06/20 0541) BP: 83/54 (06/20 0626) Pulse Rate: 78 (06/20 0626)  Labs: Recent Labs    07/23/18 1857 07/23/18 2151 07/24/18 0358 07/24/18 0949  07/24/18 2158 07/25/18 0446 07/26/18 0525  HGB  --   --  12.3*  --   --   --  11.8* 11.1*  HCT  --   --  37.7*  --   --   --  38.0* 34.8*  PLT  --   --  145*  --   --   --  135* 145*  APTT 29  --   --   --   --   --   --   --   LABPROT 13.2  --   --   --   --   --   --   --   INR 1.0  --   --   --   --   --   --   --   HEPARINUNFRC  --   --  0.73*  --    < > 0.46 0.47 0.21*  CREATININE  --   --  1.20  --   --   --  1.11 1.27*  TROPONINI <0.03 <0.03 <0.03 <0.03  --   --   --   --    < > = values in this interval not displayed.    CrCl = 60 mL/min  Assessment: Pt has PMH significant for metastatic cancer; presenting with SOB. Pt found to have PE and pharmacy consulted to dose/monitor heparin infusion. Pt was not on anticoagulants PTA. Baseline labs WNL.  Today, 07/26/18   RN reports heparin infusing at 1200 units/hr, off last night from 1800 to 1915 for MRI but no interruptions since then.  RN reports patient continues to describe coughing up small amounts of bloody sputum.  MRI head 6/19 showed no intracranial metastatic disease or bleeding.  Heparin level this AM is subtherapeutic (0.21).   CBC: Hgb slightly low but >10.  Pltc slightly low but improving.  Goal of Therapy:  Heparin level 0.3-0.7 units/ml Monitor platelets by anticoagulation protocol: Yes   Plan:  Increase heparin to 1400 units/hr.  (No bolus given ongoing  small amounts of hemoptysis).  Recheck heparin level at 1 pm.  Await further word on potential transition to apixaban.  Clayburn Pert, PharmD, BCPS [phone] 07/26/2018  6:35 AM

## 2018-07-26 NOTE — Consult Note (Signed)
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 Pleural effusion requiring VATS procedure until pleurodesis in the past History of present illness   Presented to the hospital worsening shortness of breath Was placed on supplemental oxygen by EMS Noted to have progressive disease Past Medical History   Past Medical History:  Diagnosis Date   Adenocarcinoma of left lung, 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 Positive for acute pulmonary embolus essentially within the main right pulmonary artery with extension into the right middle lobe and right lower lobe pulmonary arteries. Positive for acute PE with CT evidence of right heart strain (RV/LV Ratio = 1.3) consistent with at least submassive (intermediate risk) PE. The presence of right heart strain has been associated with an increased risk of morbidity and mortality. 2. Worsening metastatic disease as evidenced by multiple pathologically enlarged mediastinal lymph nodes as detailed above in addition to multiple enlarging subpleural pulmonary nodules on the left and a metastatic deposit that appears to be centered within  or adjacent to the left hemidiaphragm. 3. Large partially visualized right renal mass. 4. Elevation of the left hemidiaphragm. There is a trace left-sided pleural effusion with adjacent atelectasis.  Echocardiogram: Normal ejection fraction, no right ventricular dysfunction   Interim history/subjective:  Feeling relatively well Denies feeling lightheaded No significant emesis  Objective   Blood pressure (!) 101/59, pulse 76, temperature 98 F (36.7 C), temperature source Oral, resp. rate 16, height 5' 9.5" (1.765 m), weight 88.5 kg, SpO2 99 %.        Intake/Output Summary (Last 24 hours) at 07/26/2018 0914 Last data filed at 07/26/2018 0700 Gross per 24 hour  Intake 1030.6 ml  Output 500 ml  Net 530.6 ml   Filed Weights   07/23/18 1824  Weight: 88.5 kg    Examination: General: Elderly gentleman, appears comfortable HENT: Moist oral mucosa Lungs: Decreased breath sounds bilaterally Cardiovascular: S1-S2 appreciated Abdomen: Soft, bowel sounds appreciated Extremities: No clubbing, no edema Neuro: Alert and oriented x3 GU:   Resolved Hospital Problem list     Assessment & Plan:   Pulmonary embolism  -Submassive -Right ventricular size ratio 1.3 on CT -Echo did not reveal right ventricular dysfunction -Troponin, BNP-within normal limits -We will continue anticoagulation at present -Transition to NOAC  Hemoptysis -Multifactorial -May be secondary to the PE, May be related to anticoagulation although seems to be very minimal -He has no acute bleeding at present -Continue anticoagulation   Hypotension -May be related to his known dehydration -Does not appear to be very symptomatic at present -Continue fluid resuscitation  Adenocarcinoma of the left lung-stage IV  -He is on immunotherapy -Goal will be to have him start on his treatment once is stable and discharged  History of talc pleurodesis -We will explain chest x-ray findings of chronically elevated  hemidiaphragm  Hypopituitarism -Continue steroids and Synthroid   Essential hypertension -Continue to hold antihypertensives  Not a candidate for thrombolytics Risk of bleeding is very high  Best practice:  Diet: Heart healthy DVT prophylaxis: On full anticoagulation Mobility: Ambulate as tolerated Code Status: Full code Disposition: Continue management on MedSurg floor  We will follow  Labs   CBC: Recent Labs  Lab 07/23/18 1257 07/24/18 0358 07/25/18 0446 07/26/18 0525  WBC 13.5* 10.6* 13.6* 16.4*  NEUTROABS 10.9*  --  10.2* 12.8*  HGB 12.2* 12.3* 11.8* 11.1*  HCT 38.1* 37.7* 38.0* 34.8*  MCV 101.3* 101.3* 102.4* 101.2*  PLT 155 145* 135* 145*    Basic Metabolic Panel: Recent Labs  Lab 07/23/18 1257 07/24/18 0358 07/25/18 0446 07/26/18 0525  NA 137 136 135 134*  K 3.8 4.0 4.5 4.0  CL 102 103 102 102  CO2 25 22 22  20*  GLUCOSE 91 94 93 107*  BUN 24* 20 20 19   CREATININE 1.39* 1.20 1.11 1.27*  CALCIUM 9.0 8.5* 8.5* 8.5*  MG  --  2.3 2.2 2.0  PHOS  --  2.6 2.5 2.5   GFR: Estimated Creatinine Clearance: 52.4 mL/min (A) (by C-G formula based on SCr of 1.27 mg/dL (H)). Recent Labs  Lab 07/23/18 1257 07/24/18 0358 07/25/18 0446 07/26/18 0525  WBC 13.5* 10.6* 13.6* 16.4*    Liver Function Tests: Recent Labs  Lab 07/23/18 1257 07/24/18 0358 07/25/18 0446 07/26/18 0525  AST 15 13* 15 12*  ALT 13 11 9 11   ALKPHOS 71 64 60 58  BILITOT 0.7 0.6 0.8 1.1  PROT 7.6 6.4* 6.6 6.7  ALBUMIN 4.2 3.6 3.6 3.3*   No results for input(s): LIPASE, AMYLASE in the last 168 hours. No results for input(s): AMMONIA in the last 168 hours.  ABG    Component Value Date/Time   PHART 7.455 (H) 08/07/2016 0335   PCO2ART 35.8 08/07/2016 0335   PO2ART 87.2 08/07/2016 0335   HCO3 24.9 08/07/2016 0335   O2SAT 96.6 08/07/2016 0335     Coagulation Profile: Recent Labs  Lab 07/23/18 1857  INR 1.0    Cardiac Enzymes: Recent Labs  Lab 07/23/18 1857  07/23/18 2151 07/24/18 0358 07/24/18 0949  TROPONINI <0.03 <0.03 <0.03 <0.03    HbA1C: No results found for: HGBA1C  CBG: No results for input(s): GLUCAP in the last 168 hours.  Review of Systems:   Review of Systems  Constitutional: Negative.   HENT: Negative.   Eyes: Negative.   Respiratory: Positive for cough, hemoptysis and shortness of breath.   Cardiovascular: Negative.   Gastrointestinal: Negative.   Musculoskeletal: Negative.   Skin: Negative.   Psychiatric/Behavioral: Negative.    Past Medical History  He,  has a past medical history of Adenocarcinoma of left lung, stage 4 ([place]) (08/13/2016), Age-related bone loss, Erectile dysfunction, GERD (gastroesophageal reflux disease), Hyperkalemia, Hyperlipidemia, Hypertension, Other dietary vitamin B12 deficiency anemia, Prostate cancer ([place]), Urinary tract infection, and Vitamin D deficiency.   Surgical History    Past Surgical History:  Procedure Laterality Date   CHEST TUBE INSERTION Left 08/06/2016   Procedure: INSERTION PLEURAL DRAINAGE CATHETER, left;  Surgeon: [name], MD;  Location: [facility];  Service: Thoracic;  Laterality: Left;   PLEURAL BIOPSY Left 08/06/2016   Procedure: PLEURAL BIOPSY;  Surgeon: [name], MD;  Location: [place];  Service: Thoracic;  Laterality: Left;   REMOVAL OF PLEURAL  DRAINAGE CATHETER Left 08/28/2016   Procedure: REMOVAL OF PLEURAL DRAINAGE CATHETER;  Surgeon: [name], MD;  Location: [place];  Service: Thoracic;  Laterality: Left;   TALC PLEURODESIS Left 08/06/2016   Procedure: Pietro Cassis;  Surgeon: [name], MD;  Location: [place];  Service: Thoracic;  Laterality: Left;   VIDEO ASSISTED THORACOSCOPY Left 08/06/2016   Procedure: VIDEO ASSISTED THORACOSCOPY, left;  Surgeon: [name], MD;  Location: [place];  Service: Thoracic;  Laterality: Left;   VIDEO BRONCHOSCOPY N/A 08/06/2016   Procedure: VIDEO BRONCHOSCOPY;  Surgeon: [name], MD;   Location: [facility];  Service: Thoracic;  Laterality: N/A;     Social History   reports that he quit smoking about 34 years ago. His smoking use included cigarettes. He has never used smokeless tobacco. He reports current alcohol use. He reports current drug use. Drug: Marijuana.   Family History   His family history includes Diabetes in an other family member; Prostate cancer in his brother and father. There is no history of CAD or Stroke.   Allergies Allergies  Allergen Reactions   No Known Allergies      Home Medications  Prior to Admission medications   Medication Sig Start Date End Date Taking? Authorizing Provider  amLODipine-benazepril (LOTREL) 5-20 MG capsule Take 1 capsule by mouth daily.   Yes [provider]  aspirin EC 81 MG tablet Take 81 mg by mouth daily.   Yes [provider]  atorvastatin (LIPITOR) 40 MG tablet Take 40 mg by mouth daily.   Yes [provider]  EUTHYROX 75 MCG tablet Take 75 mcg by mouth daily before breakfast.  06/29/18  Yes [provider]  lactose free nutrition (BOOST) LIQD Take 237 mLs by mouth 2 (two) times daily as needed (nutrition).    Yes [provider]  predniSONE (DELTASONE) 5 MG tablet Take 5 mg by mouth daily with breakfast.  07/15/18  Yes [provider]  bisacodyl (DULCOLAX) 10 MG suppository Place 1 suppository (10 mg total) rectally daily as needed for moderate constipation. Patient not taking: Reported on 07/23/2018 08/10/16   Aline August, MD  ondansetron (ZOFRAN) 4 MG tablet Take 1 tablet (4 mg total) by mouth every 6 (six) hours as needed for nausea. Patient not taking: Reported on 07/23/2018 08/10/16   Aline August, MD  polyethylene glycol (MIRALAX / GLYCOLAX) packet Take 17 g by mouth daily. Patient taking differently: Take 17 g by mouth daily as needed (for constipation).  08/10/16   Aline August, MD    Sherrilyn Rist, MD [place], PCCM Cell: [phone]

## 2018-07-26 NOTE — Progress Notes (Signed)
NP on call was notified of patient's low blood pressure this morning. New orders received for a 500cc NS bolus. Bolus infusing and day RN informed to recheck blood pressure once bolus is done. Will continue to closely monitor patient.

## 2018-07-26 NOTE — Progress Notes (Signed)
[place] for heparin drip Indication: pulmonary embolus  Allergies  Allergen Reactions  . No Known Allergies     Patient Measurements: Height: 5' 9.5" (176.5 cm) Weight: 195 lb (88.5 kg) IBW/kg (Calculated) : 71.85 Height: 176.5 cm Weight: 88.5 kg Heparin Dosing Weight = TBW = 88.5 kg  Vital Signs: Temp: 98.2 F (36.8 C) (06/20 1318) Temp Source: Oral (06/20 1318) BP: 108/57 (06/20 1318) Pulse Rate: 84 (06/20 1318)  Labs: Recent Labs    07/23/18 1857 07/23/18 2151  07/24/18 0358 07/24/18 0949  07/25/18 0446 07/26/18 0525 07/26/18 1243  HGB  --   --    < > 12.3*  --   --  11.8* 11.1*  --   HCT  --   --   --  37.7*  --   --  38.0* 34.8*  --   PLT  --   --   --  145*  --   --  135* 145*  --   APTT 29  --   --   --   --   --   --   --   --   LABPROT 13.2  --   --   --   --   --   --   --   --   INR 1.0  --   --   --   --   --   --   --   --   HEPARINUNFRC  --   --   --  0.73*  --    < > 0.47 0.21* 0.34  CREATININE  --   --   --  1.20  --   --  1.11 1.27*  --   TROPONINI <0.03 <0.03  --  <0.03 <0.03  --   --   --   --    < > = values in this interval not displayed.    CrCl = 52 mL/min  Assessment: Pt has PMH significant for metastatic cancer; presenting with SOB. Pt found to have PE and pharmacy consulted to dose/monitor heparin infusion. Pt was not on anticoagulants PTA. Baseline labs WNL. Significant events:  MRI head 6/19 showed no intracranial metastatic disease or bleeding.  Today, 07/26/18   Heparin level therapeutic after rate increase to 1400 units/hr  (0.34)   RN reports patient continues to describe coughing up small amounts of bloody sputum.  Unlikely this is related to anticoagulation per pulmonologist.  No infusion related issues reported.   CBC: Hgb slightly low but >10.  Pltc slightly low but improving.  Goal of Therapy:  Heparin level 0.3-0.7 units/ml Monitor platelets by anticoagulation protocol: Yes    Plan:  Continue heparin infusion at 1400 units/hr.  Recheck 6h heparin level to verify rate  Daily heparin level & CBC while on heparin  F/U plan re: transition to apixaban.  Netta Cedars, PharmD, BCPS 07/26/2018  2:58 PM

## 2018-07-27 DIAGNOSIS — D539 Nutritional anemia, unspecified: Secondary | ICD-10-CM

## 2018-07-27 DIAGNOSIS — J40 Bronchitis, not specified as acute or chronic: Secondary | ICD-10-CM

## 2018-07-27 LAB — COMPREHENSIVE METABOLIC PANEL
ALT: 8 U/L (ref 0–44)
AST: 10 U/L — ABNORMAL LOW (ref 15–41)
Albumin: 3 g/dL — ABNORMAL LOW (ref 3.5–5.0)
Alkaline Phosphatase: 53 U/L (ref 38–126)
Anion gap: 9 (ref 5–15)
BUN: 21 mg/dL (ref 8–23)
CO2: 22 mmol/L (ref 22–32)
Calcium: 7.8 mg/dL — ABNORMAL LOW (ref 8.9–10.3)
Chloride: 99 mmol/L (ref 98–111)
Creatinine, Ser: 1.22 mg/dL (ref 0.61–1.24)
GFR calc Af Amer: >60 mL/min (ref >60)
GFR calc non Af Amer: 56 mL/min — ABNORMAL LOW (ref >60)
Glucose, Bld: 102 mg/dL — ABNORMAL HIGH (ref 70–99)
Potassium: 4.6 mmol/L (ref 3.5–5.1)
Sodium: 130 mmol/L — ABNORMAL LOW (ref 135–145)
Total Bilirubin: 0.9 mg/dL (ref 0.3–1.2)
Total Protein: 6.1 g/dL — ABNORMAL LOW (ref 6.5–8.1)

## 2018-07-27 LAB — EXPECTORATED SPUTUM ASSESSMENT W GRAM STAIN, RFLX TO RESP C

## 2018-07-27 LAB — PHOSPHORUS: Phosphorus: 2.4 mg/dL — ABNORMAL LOW (ref 2.5–4.6)

## 2018-07-27 LAB — CBC WITH DIFFERENTIAL/PLATELET
Abs Immature Granulocytes: 0.25 10*3/uL — ABNORMAL HIGH (ref 0.00–0.07)
Basophils Absolute: 0 10*3/uL (ref 0.0–0.1)
Basophils Relative: 0 %
Eosinophils Absolute: 0.1 10*3/uL (ref 0.0–0.5)
Eosinophils Relative: 1 %
HCT: 32.4 % — ABNORMAL LOW (ref 39.0–52.0)
Hemoglobin: 10.2 g/dL — ABNORMAL LOW (ref 13.0–17.0)
Immature Granulocytes: 2 %
Lymphocytes Relative: 6 %
Lymphs Abs: 1.1 10*3/uL (ref 0.7–4.0)
MCH: 32.3 pg (ref 26.0–34.0)
MCHC: 31.5 g/dL (ref 30.0–36.0)
MCV: 102.5 fL — ABNORMAL HIGH (ref 80.0–100.0)
Monocytes Absolute: 2 10*3/uL — ABNORMAL HIGH (ref 0.1–1.0)
Monocytes Relative: 12 %
Neutro Abs: 13.7 10*3/uL — ABNORMAL HIGH (ref 1.7–7.7)
Neutrophils Relative %: 79 %
Platelets: 148 10*3/uL — ABNORMAL LOW (ref 150–400)
RBC: 3.16 MIL/uL — ABNORMAL LOW (ref 4.22–5.81)
RDW: 14.1 % (ref 11.5–15.5)
WBC: 17.1 10*3/uL — ABNORMAL HIGH (ref 4.0–10.5)
nRBC: 0 % (ref 0.0–0.2)

## 2018-07-27 LAB — MAGNESIUM: Magnesium: 2.1 mg/dL (ref 1.7–2.4)

## 2018-07-27 LAB — PROCALCITONIN: Procalcitonin: 1.09 ng/mL

## 2018-07-27 LAB — SARS CORONAVIRUS 2 BY RT PCR (HOSPITAL ORDER, PERFORMED IN ~~LOC~~ HOSPITAL LAB): SARS Coronavirus 2: NEGATIVE

## 2018-07-27 LAB — HEPARIN LEVEL (UNFRACTIONATED): Heparin Unfractionated: 0.31 IU/mL (ref 0.30–0.70)

## 2018-07-27 LAB — MRSA PCR SCREENING: MRSA by PCR: NEGATIVE

## 2018-07-27 MED ORDER — DOXYCYCLINE HYCLATE 100 MG PO TABS
100.0000 mg | ORAL_TABLET | Freq: Two times a day (BID) | ORAL | Status: DC
  Administered 2018-07-27 – 2018-08-01 (×11): 100 mg via ORAL
  Filled 2018-07-27 (×11): qty 1

## 2018-07-27 MED ORDER — SODIUM CHLORIDE 0.9 % IV SOLN
1.0000 g | Freq: Two times a day (BID) | INTRAVENOUS | Status: DC
  Filled 2018-07-27: qty 1

## 2018-07-27 MED ORDER — APIXABAN 5 MG PO TABS: 5.0000 mg | ORAL_TABLET | Freq: Two times a day (BID) | ORAL | Status: DC

## 2018-07-27 MED ORDER — APIXABAN 5 MG PO TABS
10.0000 mg | ORAL_TABLET | Freq: Two times a day (BID) | ORAL | Status: DC
  Administered 2018-07-27 – 2018-08-01 (×11): 10 mg via ORAL
  Filled 2018-07-27: qty 2
  Filled 2018-07-27: qty 4
  Filled 2018-07-27 (×7): qty 2
  Filled 2018-07-27 (×2): qty 4
  Filled 2018-07-27 (×2): qty 2

## 2018-07-27 MED ORDER — HEPARIN (PORCINE) 25000 UT/250ML-% IV SOLN
1450.0000 [IU]/h | INTRAVENOUS | Status: DC
  Administered 2018-07-27: 1450 [IU]/h via INTRAVENOUS
  Filled 2018-07-27: qty 250

## 2018-07-27 MED ORDER — K PHOS MONO-SOD PHOS DI & MONO 155-852-130 MG PO TABS
500.0000 mg | ORAL_TABLET | Freq: Once | ORAL | Status: AC
  Administered 2018-07-27: 500 mg via ORAL
  Filled 2018-07-27: qty 2

## 2018-07-27 NOTE — Progress Notes (Signed)
PROGRESS NOTE    Jeremy Chan  NAT:[id number] DOB: March 27, 1940 DOA: 07/23/2018 PCP: Jeremy Rushing, MD   Brief Narrative:  HPI per Jeremy Chan on 07/23/2018 Jeremy Chan is a 78 y.o. 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  When EMS arrived his oxygen saturation was down to 92% he was placed on nonrebreather secondary to increased work of breathing although by that time his oxygen saturation was 95% room air He has had decreased appetite and fatigue which is been chronic since his diagnosis of cancer having had significant coughing no fevers no chest pain he denies any leg swelling   Recently seen by his oncologist CT 07/16/18 showed enlargement of a right renal neoplasm w/d interval extension into the R renal vein and IVC, enlargement of multiple necrotic mediastinal lymph node conglomerates, and increased nodularity chest and abdomen. Stable multiple sclerotic and lytic foci of osseous metastatic dise.  Plan is to hold immunotherapy w/ switch to cabozantinib   recent CT chest/abd/pelvis from 07/16/2018  Showed  progression of disease including enlargement of a right renal neoplasm with interval extension into the right renal vein and inferior vena cava, enlargement of multiple necrotic mediastinal lymph node conglomerates, and enlarging nodularity involving  the chest and abdomen   **Interim History Patient remains on a heparin drip and case was discussed with In house oncology who recommends full dose anticoagulation with Lovenox at discharge I spoke with the patient's primary oncologist who recommends either way starting him on a anticoagulant and is okay with doing a NOAC.Marland Kitchen  Echocardiogram was done and not show any right heart strain and no evidence of any right systolic dysfunction and lower extremity duplex was done and was negative for VTE.  After discussion with his primary oncologist Jeremy Chan, he recommends obtaining an MRI and continue heparin drip for now before transitioning to rule out any metastatic lesions with hemorrhagic transformation in the brain.  MRI done and was within limits for detection on a noncontrast examination but showed no intracranial metastatic disease.  Atrophy with chronic microvascular ischemic change was seen and slight tonsillar ectopia but there is no intracranial mass lesion or acute stroke noted as well.  Overnight the patient became hypotensive was given a fluid bolus and was then started back on IV fluids.  Pulmonary was consulted for his hemoptysis and pulmonary embolus and because of the submassive size Jeremy Chan recommending continuing heparin drip and transitioning to NOAC.  Jeremy Chan evaluated for his hemoptysis and likely thought it may be secondary to PE for anticoagulation and was minimal and recommends continuing the anticoagulation and transitioning to po Eliquis.  Currently the patient is not a candidate for thrombolytics.  His oxygen requirements are weaning but patient still feels anxious about not having oxygen.  We will attempt to walk screen today and continue fluids at a low dose given that his sodium is now 130.  Antibiotics were  started with p.o. doxycycline given him questionable pneumonia versus bronchitis as WBCs are elevating and procalcitonin is also going up slightly.  Chest x-ray did show  slight progression of left lung base opacity earlier which may be due to increasing pleural effusion/electrophysical airspace disease/pneumonia and there is increase in right infrahilar opacity favoring atelectasis and there is concern for aspiration however speech therapy evaluated and currently recommending regular diet with thin liquids.  Assessment & Plan:   Principal Problem:   Pulmonary embolism (HCC) Active Problems:   Hyperlipidemia   Essential hypertension   Renal cell cancer, right (HCC)   CAD (coronary artery disease)   Dehydration   AKI (acute kidney injury) ([place])   Hypothyroidism   Hypopituitarism ([place])  Acute Pulmonary Embolism and likely submassive -Setting of hypercoagulable state secondary to his malignancies -Admitted to inpatient telemetry and will continue telemetry monitoring -Cycle cardiac troponins were less than 0.034 -CTA of the chest showed positive acute PE essentially within the main right pulmonary artery with extension to the right middle lobe and right lower lobe pulmonary arteries.  There is CT evidence of right heart strain with RV/LV ratio of 1.3 consistent with a recent submassive PE -Started on heparin drip and will continue for now -Discussed with medical oncology Jeremy Chan who recommends transitioning to Lovenox at discharge 1 mg/kg twice daily at discharge but will continue heparin drip for at least 48 hours currently; after further discussion and discussion with his primary oncologist Jeremy Chan at [facility], Jeremy Chan recommends starting the patient on a NOAC at D/C as he does not have a Preference but recommends obtaining an MRI of the brain given patient's worsened metastatic disease -Continue to hold home blood pressure medications and avoid hypotension -Echocardiogram done and showed a normal systolic function with a EF of 55 to 60% with cavity size that was normal.  There is no increase in left ventricular wall thickness and left  ventricular diastolic parameters were consistent with impaired relaxation the right ventricle has a normal systolic function -No evidence of right heart strain or systolic dysfunction on echocardiogram -Lower extremity Dopplers were negative for VTE -Have consulted case management for cost of full dose Lovenox 1 milligram per kilogram and this is on patient's formulary and after discussion with patient's primary oncologist he recommends starting a NOAC likely apixaban at discharge but prior to this will obtain MRI to evaluate the brain for any metastatic lesions given concern for any hemorrhagic transformation; -MRI was negative as above and pulmonary evaluated and recommended continuing anticoagulation and have now transitioned to p.o. apixaban -Patient had 2 small sputum's that were bloody and this is likely partial clot and may be related to anticoagulation but pulmonary evaluated recommending continue anticoagulation at this time -We will need home ambulatory screen prior to discharge  Acute Respiratory Failure with hypoxia secondary to above -Patient was placed on 2 L of supplemental oxygen via nasal cannula has now been weaned to 1 L -Continue pulse oximetry and maintain O2 saturation greater than 92% -Continue with supplemental oxygen via nasal cannula and wean O2 as tolerated -Repeat chest x-ray yesterday morning showed persistent mediastinal fullness in the left hilar fullness consistent with known adenopathy.  There is persistent left midlung pleural-based density again and there is also left base atelectasis and infiltrate as well as left-sided pleural effusion again noted -Repeat chest x-ray yesterday evening showed a slight progression in the left lung base opacity which may be due to increasing pleural effusion/atelectasis or airspace  disease or pneumonia and there is also increased right infrahilar opacity favoring atelectasis.  After my discussion with pulmonary will empirically start  the patient on antibiotics with doxycycline for possible bronchitis and or pneumonia -in the setting of PE -We will need home Ambulatory screen prior to discharge a\  Bronchitis versus Pneumonia -Patient WBC is elevating and went from 13.6 and is now 17.1 -Procalcitonin level was 0.  5 0 and is now worsened to 1.09 -After discussion with pulmonary will start the patient on doxycycline -We will continue to monitor patient's CBC and procalcitonin level in the a.m. -Patient will need a walk screen prior to discharge -We will add breathing treatments as necessary place the patient on albuterol Neb every 2 as needed  Hyperlipidemia  -Stable continue home medications with Atorvastatin 40 g p.o. nightly  Essential Hypertension blood currently now hypotensive -Continue hold home medications with Amlodipine-Benazepril Combination given PE and AKI -Given a bolus of IV fluids the night before last and started back on maintenance fluids at 75 mL's per hour but have reduced to 50 mL/hr   Metastatic Renal cell cancer, Right Hx of Prostate Cancer -Followed at [facility] -Renal cancer has metastasized to the lung, the bone, the pelvis as well as soft tissue; patient is currently on nivolumab -Also had a history of laryngeal cancer that involved bilateral vocal cord status post radiation and history of prostate cancers treated with brachytherapy implantations over 2000 -Currently have change in immunotherapy given failure of a previous treatment and progression have not started new therapy yet -CTA showed worsening metastatic disease as evidenced by multiple pathologically enlarged mediastinal lymph nodes in addition to enlarging sub-pleural pulmonary nodules on the left and metastatic deposit that appears to be centered within or adjacent to the left hemidiaphragm -I spoke with Jeremy Chan his primary oncologist who is planning on starting a new immunotherapy at discharge but Jeremy Chan was  concerned about possible metastatic lesions to the brain given his worsening metastatic disease acutely so he recommends obtaining an MRI of the brain without contrast for now to make sure the patient does not have any metastatic lesions to the brain with possible bleeding as he is on anticoagulation -MRI was done and within limits for detection on a noncontrast examination but showed no intracranial metastatic disease.  Atrophy with chronic microvascular ischemic change was seen and slight tonsillar ectopia but there is no intracranial mass lesion or acute stroke noted as well.   CAD  with history of stents in the early 2000 -Stable continue home medications with ASA 81 mg po Daily and Atorvastatin 40 mg po Daily   Dehydration  -IV fluid hydration is now stopped but now will resume as the patient received a bolus overnight due to his hypotension -BUN/Cr was improving slightly and went from 24/1.39 -> 20/1.20 -> 20/1.11 -> 19/1.27 -> 21/1.22  AKI (acute kidney injury) ([place]) most likely secondary to dehydration -We will bolus overnight and IV fluid hydration is now resumed at 75 mL's per hour -BUN/Cr went from 24/1.39 -> 20/1.20 -> 20/1.11 -> 19/1.27 -> 21/1.22 -Continue to Monitor and follow Renal Fxn -Avoid Nephrotoxic Medications if possible   Pan-Hypopituitarism Hypothyroidism   -Continue Home Prednisone and Synthroid -Patient sees Dr. Herschel Sen[place] in endocrinology and also receives testosterone every 2 weeks  Macrocytic Anemia -Patient's Hb/Hct went from 12.2/38.1 -> 12.3/37.7 -> 11.8/38.0 -> 11.1/34.8 -> 10.2/32.4 -Checked Anemia Panel and showed an iron level of 24, U IBC of 188, TIBC 212, saturation  ratios of 11%, ferritin level 1026, folate level 4.6, and vitamin B12 level 222 -Continue to Monitor for S/Sx of Bleeding as patient is anticoagulated with Heparin gtt; Currently no overt signs of bleeding -Repeat CBC in AM   Thrombocytopenia Patient's platelet count went from 155,000 and  is now 145,000 -> 135,000 -> 145,000 -> 148,000 -Continue to monitor for signs and symptoms of bleeding; currently no overt bleeding noted -Continue monitor carefully as patient is on a heparin drip -Repeat CBC in a.m.  Leukocytosis -Likely reactive in the setting of PE but ? Bronchitis vs. PNA -Was Trending downward as patient's WBC went from 13.5 is now 10.6 but now is trending back up again and is 13.6 -> 16.4 -> 17.1 -Started patient on po Doxycycline -Continue monitor for signs and symptoms of infection -Repeat CBC in a.m.  Hyponatremia -Patient's sodium was 134 and dropped to 130; In the setting of poor po Intake as he is not eating much -Resumed IV fluid hydration at 75 mils per hour and reduced rate to   Metabolic Acidosis -Patient CO2 was 20 and anion gap was 12 -Continue with IV fluid hydration as above -Continue monitor and trend and repeat CMP in a.m.  Poor Po Intake/FTT -Nutritionist Consulted -Will get PT/OT to evaluate and Treat -C/w Gentle IVF with NS at 50 mL/hr -Repeat CXR in AM   HypoPhosphatemia -Patient's phosphorus levels morning was 2.4 -Replete with p.o. K-Phos Neutral 500 mg x 1 -Continue monitor and replete as necessary -Repeat phosphorus level in a.m.  DVT prophylaxis: Anticoagulated with Heparin gtt and will transitioned to Eliquis today  Code Status: FULL CODE  Family Communication: No family present at bedside but I spoke with the son over the phone Disposition Plan: Remain Inpatient for continued Workup and treatment and Anticipate D/C in the next 24-48 hours if medically stable and off of heparin drip and transition to [place]  Consultants:   None but case was discussed with Medical Oncology Jeremy Chan  Case was also discussed with [facility] oncology Jeremy Chan  Pulmonary Jeremy Chan   Procedures:  ECHOCARDIOGRAM 07/23/2018 IMPRESSIONS    1. The left ventricle has normal systolic function, with an ejection fraction of 55-60%.  The cavity size was normal. Left ventricular diastolic Doppler parameters are consistent with impaired relaxation.  2. The right ventricle has normal systolic function. The cavity was normal. There is no increase in right ventricular wall thickness.  FINDINGS  Left Ventricle: The left ventricle has normal systolic function, with an ejection fraction of 55-60%. The cavity size was normal. There is no increase in left ventricular wall thickness. Left ventricular diastolic Doppler parameters are consistent with  impaired relaxation.  Right Ventricle: The right ventricle has normal systolic function. The cavity was normal. There is no increase in right ventricular wall thickness.  Left Atrium: Left atrial size was normal in size.  Right Atrium: Right atrial size was normal in size. Right atrial pressure is estimated at 10 mmHg.  Interatrial Septum: No atrial level shunt detected by color flow Doppler.  Pericardium: There is no evidence of pericardial effusion.  Mitral Valve: The mitral valve is normal in structure. Mitral valve regurgitation is not visualized by color flow Doppler.  Tricuspid Valve: The tricuspid valve is normal in structure. Tricuspid valve regurgitation was not visualized by color flow Doppler.  Aortic Valve: The aortic valve is normal in structure. Aortic valve regurgitation was not visualized by color flow Doppler.  Pulmonic Valve: The pulmonic valve was  normal in structure. Pulmonic valve regurgitation is not visualized by color flow Doppler.  Venous: The inferior vena cava is normal in size with greater than 50% respiratory variability.    +--------------+--------++  LEFT VENTRICLE            +----------------+---------++ +--------------+--------++  Diastology                    PLAX 2D                   +----------------+---------++ +--------------+--------++  LV e' lateral:   9.63 cm/s    LVIDd:         5.15 cm     +----------------+---------++ +--------------+--------++  LV E/e' lateral: 6.4          LVIDs:         3.04 cm    +----------------+---------++ +--------------+--------++  LV e' medial:    7.94 cm/s    LV PW:         1.04 cm    +----------------+---------++ +--------------+--------++  LV E/e' medial:  7.8          LV IVS:        0.75 cm    +----------------+---------++ +--------------+--------++  LVOT diam:     2.00 cm    +--------------+--------++  LV SV:         90 ml      +--------------+--------++  LV SV Index:   43.06      +--------------+--------++  LVOT Area:     3.14 cm   +--------------+--------++                            +--------------+--------++  +---------------+----------++  RIGHT VENTRICLE              +---------------+----------++  RV S prime:     11.20 cm/s   +---------------+----------++  +---------------+-------++-----------++  LEFT ATRIUM              Index         +---------------+-------++-----------++  LA diam:        3.50 cm  1.70 cm/m    +---------------+-------++-----------++  LA Vol (A2C):   29.3 ml  14.26 ml/m   +---------------+-------++-----------++  LA Vol (A4C):   42.4 ml  20.64 ml/m   +---------------+-------++-----------++  LA Biplane Vol: 36.4 ml  17.72 ml/m   +---------------+-------++-----------++  +------------+-----------++  AORTIC VALVE               +------------+-----------++  LVOT Vmax:   70.90 cm/s    +------------+-----------++  LVOT Vmean:  46.900 cm/s   +------------+-----------++  LVOT VTI:    0.157 m       +------------+-----------++   +-------------+-------++  AORTA                   +-------------+-------++  Ao Root diam: 3.10 cm   +-------------+-------++  +--------------+----------++  MITRAL VALVE                +--------------+-------+ +--------------+----------++  SHUNTS                   MV Area (PHT): 2.73 cm     +--------------+-------+ +--------------+----------++  Systemic VTI:  0.16 m     MV PHT:        80.62 msec   +--------------+-------+ +--------------+----------++  Systemic Diam: 2.00 cm   MV Decel Time: 278 msec     +--------------+-------+ +--------------+----------++ +--------------+----------++  MV E velocity: 61.60 cm/s   +--------------+----------++  MV A velocity: 75.70 cm/s   +--------------+----------++  MV E/A ratio:  0.81         +--------------+----------++   Antimicrobials:  Anti-infectives (From admission, onward)   Start     Dose/Rate Route Frequency Ordered Stop   07/27/18 1100  doxycycline (VIBRA-TABS) tablet 100 mg     100 mg Oral Every 12 hours 07/27/18 1046 08/06/18 0959   07/27/18 1000  ceFEPIme (MAXIPIME) 1 g in sodium chloride 0.9 % 100 mL IVPB  Status:  Discontinued     1 g 200 mL/hr over 30 Minutes Intravenous Every 12 hours 07/27/18 0910 07/27/18 1046     Subjective: Seen and examined at bedside he was sitting in chair bedside and felt a little bit better.  Denies chest pain, lightheadedness or dizziness.  Was nervous to come off of oxygen.  No other concerns or complaints at this time and all questions answered to patient's satisfaction and updated son.  Objective: Vitals:   07/26/18 1318 07/26/18 2156 07/27/18 0518 07/27/18 1619  BP: (!) 108/57 125/71 (!) 108/58 (!) 110/58  Pulse: 84 93 87 92  Resp: 16 18 20  (!) 23  Temp: 98.2 F (36.8 C) 98.7 F (37.1 C) 98.3 F (36.8 C) 98 F (36.7 C)  TempSrc: Oral Oral Oral Oral  SpO2:  98% 94% 95%  Weight:      Height:        Intake/Output Summary (Last 24 hours) at 07/27/2018 1702 Last data filed at 07/27/2018 1624 Gross per 24 hour  Intake 1038.03 ml  Output 775 ml  Net 263.03 ml   Filed Weights   07/23/18 1824  Weight: 88.5 kg   Examination: Physical Exam:  Constitutional: Chronically ill-appearing Caucasian male who is overweight and no appreciable distress but appears mildly uncomfortable but is still very anxious sitting in the chair bedside Eyes: Lids and  conjunctive are normal.  Sclera anicteric ENMT: External ears nose appear normal.  Grossly normal hearing Neck: Appears supple no JVD Respiratory: Diminished auscultation bilaterally no appreciable wheezing, rales, rhonchi but had some coarse breath sounds.  He had unlabored breathing but is wearing supplemental oxygen via nasal cannula is wearing 1 L Cardiovascular: Regular rate and rhythm.  Has a 2 out of 6 systolic murmur.  Has mild lower extremity edema Abdomen: Soft, nontender, distended second body habitus.  Bowel sounds present GU: Deferred Musculoskeletal: No contractures or cyanosis.  No joint deformities in upper lower extremities noted Skin: Appears warm and dry no appreciable rashes or lesions on physical evaluation Neurologic: Cranial nerves II through XII grossly intact no appreciable focal deficits.  Romberg sign cerebellar reflexes were not assessed Psychiatric: Patient is awake, alert, oriented.  He is anxious but has a normal judgment and insight  Data Reviewed: I have personally reviewed following labs and imaging studies  CBC: Recent Labs  Lab 07/23/18 1257 07/24/18 0358 07/25/18 0446 07/26/18 0525 07/27/18 0503  WBC 13.5* 10.6* 13.6* 16.4* 17.1*  NEUTROABS 10.9*  --  10.2* 12.8* 13.7*  HGB 12.2* 12.3* 11.8* 11.1* 10.2*  HCT 38.1* 37.7* 38.0* 34.8* 32.4*  MCV 101.3* 101.3* 102.4* 101.2* 102.5*  PLT 155 145* 135* 145* 161*   Basic Metabolic Panel: Recent Labs  Lab 07/23/18 1257 07/24/18 0358 07/25/18 0446 07/26/18 0525 07/27/18 0503  NA 137 136 135 134* 130*  K 3.8 4.0 4.5 4.0 4.6  CL 102 103 102 102 99  CO2 25 22 22  20* 22  GLUCOSE 91 94 93 107* 102*  BUN 24* 20 20 19 21   CREATININE 1.39* 1.20 1.11 1.27* 1.22  CALCIUM 9.0 8.5* 8.5* 8.5* 7.8*  MG  --  2.3 2.2 2.0 2.1  PHOS  --  2.6 2.5 2.5 2.4*   GFR: Estimated Creatinine Clearance: 54.5 mL/min (by C-G formula based on SCr of 1.22 mg/dL). Liver Function Tests: Recent Labs  Lab 07/23/18 1257  07/24/18 0358 07/25/18 0446 07/26/18 0525 07/27/18 0503  AST 15 13* 15 12* 10*  ALT 13 11 9 11 8   ALKPHOS 71 64 60 58 53  BILITOT 0.7 0.6 0.8 1.1 0.9  PROT 7.6 6.4* 6.6 6.7 6.1*  ALBUMIN 4.2 3.6 3.6 3.3* 3.0*   No results for input(s): LIPASE, AMYLASE in the last 168 hours. No results for input(s): AMMONIA in the last 168 hours. Coagulation Profile: Recent Labs  Lab 07/23/18 1857  INR 1.0   Cardiac Enzymes: Recent Labs  Lab 07/23/18 1857 07/23/18 2151 07/24/18 0358 07/24/18 0949  TROPONINI <0.03 <0.03 <0.03 <0.03   BNP (last 3 results) No results for input(s): PROBNP in the last 8760 hours. HbA1C: No results for input(s): HGBA1C in the last 72 hours. CBG: No results for input(s): GLUCAP in the last 168 hours. Lipid Profile: No results for input(s): CHOL, HDL, LDLCALC, TRIG, CHOLHDL, LDLDIRECT in the last 72 hours. Thyroid Function Tests: No results for input(s): TSH, T4TOTAL, FREET4, T3FREE, THYROIDAB in the last 72 hours. Anemia Panel: Recent Labs    07/26/18 0525  VITAMINB12 222  FOLATE 4.6*  FERRITIN 1,026*  TIBC 212*  IRON 24*  RETICCTPCT 1.1   Sepsis Labs: Recent Labs  Lab 07/26/18 0926 07/27/18 0503  PROCALCITON 0.50 1.09    Recent Results (from the past 240 hour(s))  SARS Coronavirus 2 (CEPHEID - Performed in [place] hospital lab), Hosp Order     Status: None   Collection Time: 07/23/18  6:57 PM   Specimen: Nasopharyngeal Swab  Result Value Ref Range Status   SARS Coronavirus 2 NEGATIVE NEGATIVE Final    Comment: (NOTE) If result is NEGATIVE SARS-CoV-2 target nucleic acids are NOT DETECTED. The SARS-CoV-2 RNA is generally detectable in upper and lower  respiratory specimens during the acute phase of infection. The lowest  concentration of SARS-CoV-2 viral copies this assay can detect is 250  copies / mL. A negative result does not preclude SARS-CoV-2 infection  and should not be used as the sole basis for treatment or other  patient  management decisions.  A negative result may occur with  improper specimen collection / handling, submission of specimen other  than nasopharyngeal swab, presence of viral mutation(s) within the  areas targeted by this assay, and inadequate number of viral copies  (<250 copies / mL). A negative result must be combined with clinical  observations, patient history, and epidemiological information. If result is POSITIVE SARS-CoV-2 target nucleic acids are DETECTED. The SARS-CoV-2 RNA is generally detectable in upper and lower  respiratory specimens dur ing the acute phase of infection.  Positive  results are indicative of active infection with SARS-CoV-2.  Clinical  correlation with patient history and other diagnostic information is  necessary to determine patient infection status.  Positive results do  not rule out bacterial infection or co-infection with other viruses. If result is PRESUMPTIVE POSTIVE SARS-CoV-2 nucleic acids MAY BE PRESENT.   A presumptive positive result was obtained on the submitted specimen  and confirmed on repeat testing.  While 2019 novel coronavirus  (SARS-CoV-2)  nucleic acids may be present in the submitted sample  additional confirmatory testing may be necessary for epidemiological  and / or clinical management purposes  to differentiate between  SARS-CoV-2 and other Sarbecovirus currently known to infect humans.  If clinically indicated additional testing with an alternate test  methodology [phone]) is advised. The SARS-CoV-2 RNA is generally  detectable in upper and lower respiratory sp ecimens during the acute  phase of infection. The expected result is Negative. Fact Sheet for Patients:  StrictlyIdeas.no Fact Sheet for Healthcare Providers: BankingDealers.co.za This test is not yet approved or cleared by the [place] FDA and has been authorized for detection and/or diagnosis of SARS-CoV-2 by FDA under  an Emergency Use Authorization (EUA).  This EUA will remain in effect (meaning this test can be used) for the duration of the COVID-19 declaration under Section 564(b)(1) of the Act, 21 U.S.C. section 360bbb-3(b)(1), unless the authorization is terminated or revoked sooner. Performed at [facility], [place] [address] [place], [place] [address]   MRSA PCR Screening     Status: None   Collection Time: 07/27/18  8:43 AM   Specimen: Nasopharyngeal  Result Value Ref Range Status   MRSA by PCR NEGATIVE NEGATIVE Final    Comment:        The GeneXpert MRSA Assay (FDA approved for NASAL specimens only), is one component of a comprehensive MRSA colonization surveillance program. It is not intended to diagnose MRSA infection nor to guide or monitor treatment for MRSA infections. Performed at [facility], [place] [address], [place], [place] [address]   SARS Coronavirus 2 (CEPHEID - Performed in [place] hospital lab), Hosp Order     Status: None   Collection Time: 07/27/18  8:52 AM   Specimen: Nasopharyngeal Swab  Result Value Ref Range Status   SARS Coronavirus 2 NEGATIVE NEGATIVE Final    Comment: (NOTE) If result is NEGATIVE SARS-CoV-2 target nucleic acids are NOT DETECTED. The SARS-CoV-2 RNA is generally detectable in upper and lower  respiratory specimens during the acute phase of infection. The lowest  concentration of SARS-CoV-2 viral copies this assay can detect is 250  copies / mL. A negative result does not preclude SARS-CoV-2 infection  and should not be used as the sole basis for treatment or other  patient management decisions.  A negative result may occur with  improper specimen collection / handling, submission of specimen other  than nasopharyngeal swab, presence of viral mutation(s) within the  areas targeted by this assay, and inadequate number of viral copies  (<250 copies / mL). A negative result must be combined with clinical   observations, patient history, and epidemiological information. If result is POSITIVE SARS-CoV-2 target nucleic acids are DETECTED. The SARS-CoV-2 RNA is generally detectable in upper and lower  respiratory specimens dur ing the acute phase of infection.  Positive  results are indicative of active infection with SARS-CoV-2.  Clinical  correlation with patient history and other diagnostic information is  necessary to determine patient infection status.  Positive results do  not rule out bacterial infection or co-infection with other viruses. If result is PRESUMPTIVE POSTIVE SARS-CoV-2 nucleic acids MAY BE PRESENT.   A presumptive positive result was obtained on the submitted specimen  and confirmed on repeat testing.  While 2019 novel coronavirus  (SARS-CoV-2) nucleic acids may be present in the submitted sample  additional confirmatory testing may be necessary for epidemiological  and / or clinical management purposes  to differentiate between  SARS-CoV-2 and other Sarbecovirus currently known to infect humans.  If clinically indicated additional testing with an alternate test  methodology [phone]) is advised. The SARS-CoV-2 RNA is generally  detectable in upper and lower respiratory sp ecimens during the acute  phase of infection. The expected result is Negative. Fact Sheet for Patients:  StrictlyIdeas.no Fact Sheet for Healthcare Providers: BankingDealers.co.za This test is not yet approved or cleared by the [place] FDA and has been authorized for detection and/or diagnosis of SARS-CoV-2 by FDA under an Emergency Use Authorization (EUA).  This EUA will remain in effect (meaning this test can be used) for the duration of the COVID-19 declaration under Section 564(b)(1) of the Act, 21 U.S.C. section 360bbb-3(b)(1), unless the authorization is terminated or revoked sooner. Performed at [facility], [place]  [address] [place], [place] [address]      Radiology Studies: Mr Brain Wo Contrast  Result Date: 07/25/2018 CLINICAL DATA:  Chronic headache.  Cancer. EXAM: MRI HEAD WITHOUT CONTRAST TECHNIQUE: Multiplanar, multiecho pulse sequences of the brain and surrounding structures were obtained without intravenous contrast. COMPARISON:  08/29/2016. FINDINGS: Brain: No evidence for acute infarction, hemorrhage, mass lesion, hydrocephalus, or extra-axial fluid. Mild atrophy. Mild subcortical and periventricular T2 and FLAIR hyperintensities, likely chronic microvascular ischemic change. Vascular: Flow voids are maintained throughout the carotid, basilar, and vertebral arteries. There are no areas of chronic hemorrhage. Skull and upper cervical spine: Mild tonsillar ectopia. No frank Chiari malformation. No cervical hydromyelia Sinuses/Orbits: No acute findings. Other: LEFT mastoid effusion. Compared with priors, similar appearance. IMPRESSION: Within limits for detection on noncontrast exam, no intracranial metastatic disease. Atrophy with chronic microvascular ischemic change. Slight tonsillar ectopia. No intracranial mass lesion or acute stroke. Electronically Signed   By: Staci Righter M.D.   On: 07/25/2018 20:47   Dg Chest Port 1 View  Result Date: 07/26/2018 CLINICAL DATA:  Weakness and shortness of breath. EXAM: PORTABLE CHEST 1 VIEW COMPARISON:  Radiograph earlier this day at 0456 hour, chest CT 07/23/2018 FINDINGS: Unchanged heart size and mediastinal contours. Unchanged elevation of left hemidiaphragm. Slight progression of left lung base opacity from earlier today. Increased right infrahilar opacity. Peripheral nodular density in the left lower lung corresponding to pulmonary metastasis. Unchanged heart size and mediastinal contours. Left hilar rounded density representing metastatic adenopathy on CT. No pulmonary edema. No pneumothorax. IMPRESSION: 1. Slight progression in left lung base opacity from  earlier today, which may be due to increasing pleural effusion, atelectasis, or airspace disease/pneumonia 2. Increased right infrahilar opacity favoring atelectasis. However recommend correlation for aspiration risk factors. Electronically Signed   By: Keith Rake M.D.   On: 07/26/2018 20:34   Dg Chest Port 1 View  Result Date: 07/26/2018 CLINICAL DATA:  Dyspnea EXAM: PORTABLE CHEST 1 VIEW COMPARISON:  Chest radiograph from one day prior. FINDINGS: Stable cardiomediastinal silhouette with normal heart size and enlargement of the AP window contour. No pneumothorax. No pleural effusion. Stable elevation of the left hemidiaphragm. No pulmonary edema. Stable nodular peripheral left midlung opacity and left lung base atelectasis. IMPRESSION: 1. Stable AP window fullness compatible with known metastatic adenopathy. 2. Stable nodular peripheral left mid lung opacity compatible with pulmonary metastasis seen on recent chest CT. 3. Stable left lung base atelectasis with elevation of the left hemidiaphragm. Electronically Signed   By: Ilona Sorrel M.D.   On: 07/26/2018 07:11   Scheduled Meds:  apixaban  10 mg Oral BID   aspirin EC  81 mg Oral Daily  atorvastatin  40 mg Oral Daily   doxycycline  100 mg Oral Q12H   levothyroxine  75 mcg Oral QAC breakfast   predniSONE  5 mg Oral Q breakfast   Continuous Infusions:  sodium chloride 50 mL/hr at 07/27/18 1244    LOS: 3 days   Kerney Elbe, DO [facility] PAGER is on AMION  If 7PM-7AM, please contact night-coverage [email or web address] Password [facility] 07/27/2018, 5:02 PM

## 2018-07-27 NOTE — Evaluation (Signed)
Clinical/Bedside Swallow Evaluation Patient Details  Name: Jeremy Chan MRN: [id number] Date of Birth: 1940-10-25  Today's Date: 07/27/2018 Time: SLP Start Time (ACUTE ONLY): 1500 SLP Stop Time (ACUTE ONLY): 1528 SLP Time Calculation (min) (ACUTE ONLY): 28 min  Past Medical History:  Past Medical History:  Diagnosis Date  . Adenocarcinoma of left lung, stage 4 ([place]) 08/13/2016  . Age-related bone loss   . Erectile dysfunction   . GERD (gastroesophageal reflux disease)   . Hyperkalemia   . Hyperlipidemia   . Hypertension   . Other dietary vitamin B12 deficiency anemia   . Prostate cancer ([place])   . Urinary tract infection   . Vitamin D deficiency    Past Surgical History:  Past Surgical History:  Procedure Laterality Date  . CHEST TUBE INSERTION Left 08/06/2016   Procedure: INSERTION PLEURAL DRAINAGE CATHETER, left;  Surgeon: [name], MD;  Location: [place];  Service: Thoracic;  Laterality: Left;  . PLEURAL BIOPSY Left 08/06/2016   Procedure: PLEURAL BIOPSY;  Surgeon: [name], MD;  Location: [place];  Service: Thoracic;  Laterality: Left;  . REMOVAL OF PLEURAL DRAINAGE CATHETER Left 08/28/2016   Procedure: REMOVAL OF PLEURAL DRAINAGE CATHETER;  Surgeon: [name], MD;  Location: [place];  Service: Thoracic;  Laterality: Left;  . TALC PLEURODESIS Left 08/06/2016   Procedure: Pietro Cassis;  Surgeon: [name], MD;  Location: [place];  Service: Thoracic;  Laterality: Left;  Marland Kitchen VIDEO ASSISTED THORACOSCOPY Left 08/06/2016   Procedure: VIDEO ASSISTED THORACOSCOPY, left;  Surgeon: [name], MD;  Location: [facility];  Service: Thoracic;  Laterality: Left;  Marland Kitchen VIDEO BRONCHOSCOPY N/A 08/06/2016   Procedure: VIDEO BRONCHOSCOPY;  Surgeon: [name], MD;  Location: [facility];  Service: Thoracic;  Laterality: N/A;   HPI:  Mr Jeremy Chan, 77y/m presented to ED with worsened shortness of breath upon exertion . Reduced appetite and fatigue have been chronic  since his diagnosis of cancer. 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 He  reports no difficulty with swallowing. CT is positive for PE./   Assessment / Plan / Recommendation Clinical Impression  Patient had no signs or symptoms of dysphagia at bedside . Chest xray showed increased right infrahilar opacity favoring atelectasis, however recommend correlation for aspiration risk factors. WBC's are 17.1. The chest xray with WBC could represent aspiration PNA combined with patient's history of VC CA and radiation he may be experiencing silent aspiration. Recommend SLP to monitor patient tolerance of diet and consider MBS to rule out silent aspiration based on clinical judgement. ST to follow patient for 1-2 times for further assessment.  SLP Visit Diagnosis: Dysphagia, unspecified (R13.10)    Aspiration Risk  Moderate aspiration risk(based on history )    Diet Recommendation Regular;Thin liquid   Liquid Administration via: Cup;Straw Medication Administration: Whole meds with liquid Supervision: Patient able to self feed Compensations: Slow rate;Small sips/bites Postural Changes: Seated upright at 90 degrees;Remain upright for at least 30 minutes after po intake    Other  Recommendations Oral Care Recommendations: Oral care BID   Follow up Recommendations        Frequency and Duration min 1 x/week  1 week       Prognosis Prognosis for Safe Diet Advancement: Fair Barriers to Reach Goals: (progressive disease process)  Swallow Study   General Date of Onset: 07/23/18 HPI: Mr Jeremy Chan, 77y/m presented to ED with worsened shortness of breath upon exertion . Reduced appetite and fatigue have been chronic since his diagnosis of cancer. 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 He  reports no difficulty with swallowing. CT is positive for PE./ Type of Study: Bedside Swallow Evaluation Diet Prior to this Study: Regular;Thin liquids Temperature Spikes Noted: No Respiratory Status: Room air;Nasal cannula(alternating as necessary) History of Recent Intubation: No Behavior/Cognition: Alert;Cooperative;Pleasant mood Oral Cavity Assessment: Within Functional Limits Oral Care Completed by SLP: No Oral Cavity - Dentition: Adequate natural dentition Vision: Functional for self-feeding Self-Feeding Abilities: Able to feed self Patient Positioning: Upright in bed Baseline Vocal Quality: Hoarse(gravelly quality, likely related to radiation to VC) Volitional Cough: Strong Volitional Swallow: Able to elicit    Oral/Motor/Sensory Function Overall Oral Motor/Sensory Function: Within functional limits   Ice Chips Ice chips: Within functional limits Presentation: Spoon   Thin Liquid Thin Liquid: Within functional limits Presentation: Cup;Straw    Nectar Thick Nectar Thick Liquid: Not tested   Honey Thick Honey Thick Liquid: Not tested   Puree Puree: Within functional limits Presentation: Self Fed   Solid     Solid: Within functional limits Presentation: [place], [place], CCC-SLP 07/27/2018 4:05 PM

## 2018-07-27 NOTE — Progress Notes (Signed)
[place] for heparin drip Indication: pulmonary embolus  Allergies  Allergen Reactions  . No Known Allergies     Patient Measurements: Height: 5' 9.5" (176.5 cm) Weight: 195 lb (88.5 kg) IBW/kg (Calculated) : 71.85 Height: 176.5 cm Weight: 88.5 kg Heparin Dosing Weight = TBW = 88.5 kg  Vital Signs: Temp: 98.3 F (36.8 C) (06/21 0518) Temp Source: Oral (06/21 0518) BP: 108/58 (06/21 0518) Pulse Rate: 87 (06/21 0518)  Labs: Recent Labs    07/25/18 0446 07/26/18 0525 07/26/18 1243 07/26/18 2158 07/27/18 0503  HGB 11.8* 11.1*  --   --  10.2*  HCT 38.0* 34.8*  --   --  32.4*  PLT 135* 145*  --   --  148*  HEPARINUNFRC 0.47 0.21* 0.34 0.30 0.31  CREATININE 1.11 1.27*  --   --  1.22      Assessment: Pt has PMH significant for metastatic cancer; presenting with SOB. Pt found to have PE and pharmacy consulted to dose/monitor heparin infusion. Pt was not on anticoagulants PTA. Baseline labs WNL. Significant events:  MRI head 6/19 showed no intracranial metastatic disease or bleeding.  Today, 07/27/18   Confirmatory Heparin level therapeutic at 0.31 on rate of 1450 units/hr    RN reports patient continues to describe coughing up small amounts of bloody sputum, but not worse than at beginning of shift.Margretta Ditty this is related to anticoagulation per pulmonologist.  No infusion related issues reported.   UPDATE: Orders are now received to transition patient from heparin infusion to apixaban  Plan: Stop heparin infusion Begin apixaban 10 mg PO BID x 7 days, then reduce to 5 mg PO BID.  Clayburn Pert, PharmD, BCPS [phone] 07/27/2018  11:59 AM

## 2018-07-27 NOTE — Plan of Care (Signed)
Determine need for instrumental evaluation.

## 2018-07-27 NOTE — Progress Notes (Signed)
NAME:  Jeremy Chan, MRN:  [id number], DOB:  1940/07/27, LOS: 3 ADMISSION DATE:  07/23/2018, CONSULTATION DATE: 07/26/2018 REFERRING MD: Dr. Alfredia Ferguson, CHIEF COMPLAINT: Pulmonary embolism  Brief History   Patient was admitted with shortness of breath History of renal cancer with metastases laryngeal cancer Prostate cancer-treated with brachytherapy Pleural effusion requiring VATS procedure until pleurodesis in the past History of present illness   Presented to the hospital worsening shortness of breath Was placed on supplemental oxygen by EMS Noted to have progressive disease Past Medical History   Past Medical History:  Diagnosis Date  . Adenocarcinoma of left lung, stage 4 ([place]) 08/13/2016  . Age-related bone loss   . Erectile dysfunction   . GERD (gastroesophageal reflux disease)   . Hyperkalemia   . Hyperlipidemia   . Hypertension   . Other dietary vitamin B12 deficiency anemia   . Prostate cancer ([place])   . Urinary tract infection   . Vitamin D deficiency      Significant Hospital Events   Hemoptysis-initially red blood but otherwise altered blood-minimal Hypotension-improving with fluid resuscitation  Consults:  PCCM 6/20  Procedures:    Significant Diagnostic Tests:  CT chest IMPRESSION: 1. Positive for acute pulmonary embolus essentially within the main right pulmonary artery with extension into the right middle lobe and right lower lobe pulmonary arteries. Positive for acute PE with CT evidence of right heart strain (RV/LV Ratio = 1.3) consistent with at least submassive (intermediate risk) PE. The presence of right heart strain has been associated with an increased risk of morbidity and mortality. 2. Worsening metastatic disease as evidenced by multiple pathologically enlarged mediastinal lymph nodes as detailed above in addition to multiple enlarging subpleural pulmonary nodules on the left and a metastatic deposit that appears to be centered within or  adjacent to the left hemidiaphragm. 3. Large partially visualized right renal mass. 4. Elevation of the left hemidiaphragm. There is a trace left-sided pleural effusion with adjacent atelectasis.  Echocardiogram: Normal ejection fraction, no right ventricular dysfunction  Interim history/subjective:  Feeling relatively well Denies feeling lightheaded at rest  Objective   Blood pressure (!) 108/58, pulse 87, temperature 98.3 F (36.8 C), temperature source Oral, resp. rate 20, height 5' 9.5" (1.765 m), weight 88.5 kg, SpO2 94 %.        Intake/Output Summary (Last 24 hours) at 07/27/2018 1053 Last data filed at 07/27/2018 4854 Gross per 24 hour  Intake 1809.8 ml  Output 900 ml  Net 909.8 ml   Filed Weights   07/23/18 1824  Weight: 88.5 kg    Examination: General: Elderly gentleman, appears comfortable HENT: Moist oral mucosa Lungs: Decreased breath sounds bilaterally Cardiovascular: S1-S2 appreciated Abdomen: Soft, bowel sounds appreciated Extremities: No clubbing, no edema Neuro: Alert and oriented x3 GU:   Resolved Hospital Problem list     Assessment & Plan:  Pulmonary embolism Submassive Right ventricular size ratio 1.3 on CT Echo did not reveal ventricular dysfunction Troponin, BNP within normal limits Continue anticoagulation Transition to NOAC-Eliquis today  Hemoptysis -Multifactorial May be secondary to PE, secondary to anticoagulation No acute bleeding to contraindicate continuing anticoagulation at present  Hypotension Likely related to dehydration Does not appear to be significantly symptomatic at present  Bronchitis Empirically start on doxycycline  History of talc pleurodesis CT scan findings liver related to pleural thickening, I do not think he has significant effusion  Hypopituitarism Continue steroids and Synthroid  Essential hypertension Hold antihypertensives  Not a candidate for thrombolytics, risk of bleeding is  unacceptable  Wean off supplemental oxygen as long as sats greater than 90  Best practice:  Diet: Heart healthy DVT prophylaxis: On full anticoagulation Mobility: Ambulate as tolerated Code Status: Full code Family Communication: Continue management on MedSurg floor Disposition:   Labs   CBC: Recent Labs  Lab 07/23/18 1257 07/24/18 0358 07/25/18 0446 07/26/18 0525 07/27/18 0503  WBC 13.5* 10.6* 13.6* 16.4* 17.1*  NEUTROABS 10.9*  --  10.2* 12.8* 13.7*  HGB 12.2* 12.3* 11.8* 11.1* 10.2*  HCT 38.1* 37.7* 38.0* 34.8* 32.4*  MCV 101.3* 101.3* 102.4* 101.2* 102.5*  PLT 155 145* 135* 145* 148*    Basic Metabolic Panel: Recent Labs  Lab 07/23/18 1257 07/24/18 0358 07/25/18 0446 07/26/18 0525 07/27/18 0503  NA 137 136 135 134* 130*  K 3.8 4.0 4.5 4.0 4.6  CL 102 103 102 102 99  CO2 25 22 22  20* 22  GLUCOSE 91 94 93 107* 102*  BUN 24* 20 20 19 21   CREATININE 1.39* 1.20 1.11 1.27* 1.22  CALCIUM 9.0 8.5* 8.5* 8.5* 7.8*  MG  --  2.3 2.2 2.0 2.1  PHOS  --  2.6 2.5 2.5 2.4*   GFR: Estimated Creatinine Clearance: 54.5 mL/min (by C-G formula based on SCr of 1.22 mg/dL). Recent Labs  Lab 07/24/18 0358 07/25/18 0446 07/26/18 0525 07/26/18 0926 07/27/18 0503  PROCALCITON  --   --   --  0.50 1.09  WBC 10.6* 13.6* 16.4*  --  17.1*    Liver Function Tests: Recent Labs  Lab 07/23/18 1257 07/24/18 0358 07/25/18 0446 07/26/18 0525 07/27/18 0503  AST 15 13* 15 12* 10*  ALT 13 11 9 11 8   ALKPHOS 71 64 60 58 53  BILITOT 0.7 0.6 0.8 1.1 0.9  PROT 7.6 6.4* 6.6 6.7 6.1*  ALBUMIN 4.2 3.6 3.6 3.3* 3.0*   No results for input(s): LIPASE, AMYLASE in the last 168 hours. No results for input(s): AMMONIA in the last 168 hours.  ABG    Component Value Date/Time   PHART 7.455 (H) 08/07/2016 0335   PCO2ART 35.8 08/07/2016 0335   PO2ART 87.2 08/07/2016 0335   HCO3 24.9 08/07/2016 0335   O2SAT 96.6 08/07/2016 0335     Coagulation Profile: Recent Labs  Lab 07/23/18 1857  INR 1.0     Cardiac Enzymes: Recent Labs  Lab 07/23/18 1857 07/23/18 2151 07/24/18 0358 07/24/18 0949  TROPONINI <0.03 <0.03 <0.03 <0.03    HbA1C: No results found for: HGBA1C  CBG: No results for input(s): GLUCAP in the last 168 hours.  Review of Systems:   Review of Systems  Constitutional: Negative for fever.  Respiratory: Positive for cough.      Past Medical History  He,  has a past medical history of Adenocarcinoma of left lung, stage 4 ([place]) (08/13/2016), Age-related bone loss, Erectile dysfunction, GERD (gastroesophageal reflux disease), Hyperkalemia, Hyperlipidemia, Hypertension, Other dietary vitamin B12 deficiency anemia, Prostate cancer ([place]), Urinary tract infection, and Vitamin D deficiency.   Surgical History    Past Surgical History:  Procedure Laterality Date  . CHEST TUBE INSERTION Left 08/06/2016   Procedure: INSERTION PLEURAL DRAINAGE CATHETER, left;  Surgeon: [name], MD;  Location: [facility];  Service: Thoracic;  Laterality: Left;  . PLEURAL BIOPSY Left 08/06/2016   Procedure: PLEURAL BIOPSY;  Surgeon: [name], MD;  Location: [facility];  Service: Thoracic;  Laterality: Left;  . REMOVAL OF PLEURAL DRAINAGE CATHETER Left 08/28/2016   Procedure: REMOVAL OF PLEURAL DRAINAGE CATHETER;  Surgeon: Servando Snare,  Lilia Argue, MD;  Location: [facility];  Service: Thoracic;  Laterality: Left;  . TALC PLEURODESIS Left 08/06/2016   Procedure: Pietro Cassis;  Surgeon: [name], MD;  Location: [facility];  Service: Thoracic;  Laterality: Left;  Marland Kitchen VIDEO ASSISTED THORACOSCOPY Left 08/06/2016   Procedure: VIDEO ASSISTED THORACOSCOPY, left;  Surgeon: [name], MD;  Location: [facility];  Service: Thoracic;  Laterality: Left;  Marland Kitchen VIDEO BRONCHOSCOPY N/A 08/06/2016   Procedure: VIDEO BRONCHOSCOPY;  Surgeon: [name], MD;  Location: [facility] OR;  Service: Thoracic;  Laterality: N/A;     Social History   reports that he quit smoking about 34 years ago. His smoking use included  cigarettes. He has never used smokeless tobacco. He reports current alcohol use. He reports current drug use. Drug: Marijuana.   Family History   His family history includes Diabetes in an other family member; Prostate cancer in his brother and father. There is no history of CAD or Stroke.   Allergies Allergies  Allergen Reactions  . No Known Allergies      Home Medications  Prior to Admission medications   Medication Sig Start Date End Date Taking? Authorizing Provider  amLODipine-benazepril (LOTREL) 5-20 MG capsule Take 1 capsule by mouth daily.   Yes [provider]  aspirin EC 81 MG tablet Take 81 mg by mouth daily.   Yes [provider]  atorvastatin (LIPITOR) 40 MG tablet Take 40 mg by mouth daily.   Yes [provider]  EUTHYROX 75 MCG tablet Take 75 mcg by mouth daily before breakfast.  06/29/18  Yes [provider]  lactose free nutrition BOOST) LIQD Take 237 mLs by mouth 2 (two) times daily as needed (nutrition).    Yes [provider]  predniSONE (DELTASONE) 5 MG tablet Take 5 mg by mouth daily with breakfast.  07/15/18  Yes [provider]  bisacodyl (DULCOLAX) 10 MG suppository Place 1 suppository (10 mg total) rectally daily as needed for moderate constipation. Patient not taking: Reported on 07/23/2018 08/10/16   Aline August, MD  ondansetron (ZOFRAN) 4 MG tablet Take 1 tablet (4 mg total) by mouth every 6 (six) hours as needed for nausea. Patient not taking: Reported on 07/23/2018 08/10/16   Aline August, MD  polyethylene glycol (MIRALAX / GLYCOLAX) packet Take 17 g by mouth daily. Patient taking differently: Take 17 g by mouth daily as needed (for constipation).  08/10/16   Aline August, MD

## 2018-07-27 NOTE — Progress Notes (Signed)
[place] for heparin drip Indication: pulmonary embolus  Allergies  Allergen Reactions  . No Known Allergies     Patient Measurements: Height: 5' 9.5" (176.5 cm) Weight: 195 lb (88.5 kg) IBW/kg (Calculated) : 71.85 Height: 176.5 cm Weight: 88.5 kg Heparin Dosing Weight = TBW = 88.5 kg  Vital Signs: Temp: 98.3 F (36.8 C) (06/21 0518) Temp Source: Oral (06/21 0518) BP: 108/58 (06/21 0518) Pulse Rate: 87 (06/21 0518)  Labs: Recent Labs    07/24/18 0949  07/25/18 0446 07/26/18 0525 07/26/18 1243 07/26/18 2158 07/27/18 0503  HGB  --    < > 11.8* 11.1*  --   --  10.2*  HCT  --   --  38.0* 34.8*  --   --  32.4*  PLT  --   --  135* 145*  --   --  148*  HEPARINUNFRC  --    < > 0.47 0.21* 0.34 0.30 0.31  CREATININE  --   --  1.11 1.27*  --   --  1.22  TROPONINI <0.03  --   --   --   --   --   --    < > = values in this interval not displayed.      Assessment: Pt has PMH significant for metastatic cancer; presenting with SOB. Pt found to have PE and pharmacy consulted to dose/monitor heparin infusion. Pt was not on anticoagulants PTA. Baseline labs WNL. Significant events:  MRI head 6/19 showed no intracranial metastatic disease or bleeding.  Today, 07/27/18   Confirmatory Heparin level therapeutic at 0.31 on rate of 1450 units/hr    RN reports patient continues to describe coughing up small amounts of bloody sputum, but not worse than at beginning of shift.Margretta Ditty this is related to anticoagulation per pulmonologist.  No infusion related issues reported.   Goal of Therapy:  Heparin level 0.3-0.7 units/ml Monitor platelets by anticoagulation protocol: Yes   Plan:  Continue heparin infusion at 1450 units/hr.  Daily heparin level & CBC while on heparin  F/U plan re: transition to maintenance anticoagulation regimen. Clayburn Pert, PharmD, BCPS [phone] 07/27/2018  7:11 AM

## 2018-07-27 NOTE — Progress Notes (Signed)
Pharmacy Antibiotic Note  Jeremy Chan is a 78 y.o. male admitted on 07/23/2018 with pulmonary embolism, now with worsening leukocytosis and CXR changes read as possible pneumonia.  Pharmacy has been consulted for cefepime dosing.  Plan: Cefepime 1 gram IV q12h (dosage adjusted for CrCl 30-59 mL/min) Follow clinical course, culture results, DOT  Height: 5' 9.5" (176.5 cm) Weight: 195 lb (88.5 kg) IBW/kg (Calculated) : 71.85  Temp (24hrs), Avg:98.3 F (36.8 C), Min:98.1 F (36.7 C), Max:98.7 F (37.1 C)  Recent Labs  Lab 07/23/18 1257 07/24/18 0358 07/25/18 0446 07/26/18 0525 07/27/18 0503  WBC 13.5* 10.6* 13.6* 16.4* 17.1*  CREATININE 1.39* 1.20 1.11 1.27* 1.22    Estimated Creatinine Clearance: 54.5 mL/min (by C-G formula based on SCr of 1.22 mg/dL).    Allergies  Allergen Reactions  . No Known Allergies     Antimicrobials this admission: 6/21 >> cefepime   Dose adjustments this admission:   Microbiology results: 6/17 SARSCoV2: negative  Thank you for allowing pharmacy to be a part of this patient's care.  Clayburn Pert, PharmD, BCPS [phone] 07/27/2018  9:09 AM

## 2018-07-28 ENCOUNTER — Inpatient Hospital Stay (HOSPITAL_COMMUNITY): Payer: Medicare Other

## 2018-07-28 DIAGNOSIS — E039 Hypothyroidism, unspecified: Secondary | ICD-10-CM

## 2018-07-28 DIAGNOSIS — N179 Acute kidney failure, unspecified: Secondary | ICD-10-CM

## 2018-07-28 LAB — CBC WITH DIFFERENTIAL/PLATELET
Abs Immature Granulocytes: 0.12 10*3/uL — ABNORMAL HIGH (ref 0.00–0.07)
Basophils Absolute: 0 10*3/uL (ref 0.0–0.1)
Basophils Relative: 0 %
Eosinophils Absolute: 0.2 10*3/uL (ref 0.0–0.5)
Eosinophils Relative: 1 %
HCT: 30.5 % — ABNORMAL LOW (ref 39.0–52.0)
Hemoglobin: 9.8 g/dL — ABNORMAL LOW (ref 13.0–17.0)
Immature Granulocytes: 1 %
Lymphocytes Relative: 6 %
Lymphs Abs: 0.8 10*3/uL (ref 0.7–4.0)
MCH: 32.5 pg (ref 26.0–34.0)
MCHC: 32.1 g/dL (ref 30.0–36.0)
MCV: 101 fL — ABNORMAL HIGH (ref 80.0–100.0)
Monocytes Absolute: 1.5 10*3/uL — ABNORMAL HIGH (ref 0.1–1.0)
Monocytes Relative: 11 %
Neutro Abs: 11.2 10*3/uL — ABNORMAL HIGH (ref 1.7–7.7)
Neutrophils Relative %: 81 %
Platelets: 152 10*3/uL (ref 150–400)
RBC: 3.02 MIL/uL — ABNORMAL LOW (ref 4.22–5.81)
RDW: 14.3 % (ref 11.5–15.5)
WBC: 13.8 10*3/uL — ABNORMAL HIGH (ref 4.0–10.5)
nRBC: 0 % (ref 0.0–0.2)

## 2018-07-28 LAB — COMPREHENSIVE METABOLIC PANEL
ALT: 10 U/L (ref 0–44)
AST: 11 U/L — ABNORMAL LOW (ref 15–41)
Albumin: 2.8 g/dL — ABNORMAL LOW (ref 3.5–5.0)
Alkaline Phosphatase: 59 U/L (ref 38–126)
Anion gap: 10 (ref 5–15)
BUN: 20 mg/dL (ref 8–23)
CO2: 21 mmol/L — ABNORMAL LOW (ref 22–32)
Calcium: 7.9 mg/dL — ABNORMAL LOW (ref 8.9–10.3)
Chloride: 101 mmol/L (ref 98–111)
Creatinine, Ser: 1.17 mg/dL (ref 0.61–1.24)
GFR calc Af Amer: >60 mL/min (ref >60)
GFR calc non Af Amer: 59 mL/min — ABNORMAL LOW (ref >60)
Glucose, Bld: 104 mg/dL — ABNORMAL HIGH (ref 70–99)
Potassium: 4 mmol/L (ref 3.5–5.1)
Sodium: 132 mmol/L — ABNORMAL LOW (ref 135–145)
Total Bilirubin: 0.3 mg/dL (ref 0.3–1.2)
Total Protein: 6 g/dL — ABNORMAL LOW (ref 6.5–8.1)

## 2018-07-28 LAB — MAGNESIUM: Magnesium: 2 mg/dL (ref 1.7–2.4)

## 2018-07-28 LAB — PROCALCITONIN: Procalcitonin: 1.03 ng/mL

## 2018-07-28 LAB — PHOSPHORUS: Phosphorus: 2.2 mg/dL — ABNORMAL LOW (ref 2.5–4.6)

## 2018-07-28 MED ORDER — K PHOS MONO-SOD PHOS DI & MONO 155-852-130 MG PO TABS
500.0000 mg | ORAL_TABLET | Freq: Two times a day (BID) | ORAL | Status: AC
  Administered 2018-07-28 (×2): 500 mg via ORAL
  Filled 2018-07-28 (×2): qty 2

## 2018-07-28 MED ORDER — ENSURE ENLIVE PO LIQD
237.0000 mL | Freq: Two times a day (BID) | ORAL | Status: DC
  Administered 2018-07-29 – 2018-08-01 (×6): 237 mL via ORAL

## 2018-07-28 MED ORDER — ADULT MULTIVITAMIN W/MINERALS CH
1.0000 | ORAL_TABLET | Freq: Every day | ORAL | Status: DC
  Administered 2018-07-29 – 2018-08-01 (×4): 1 via ORAL
  Filled 2018-07-28 (×4): qty 1

## 2018-07-28 MED ORDER — ENSURE SURGERY PO LIQD: 237.0000 mL | Freq: Two times a day (BID) | ORAL | Status: DC

## 2018-07-28 MED ORDER — APIXABAN 5 MG PO TABS: 5.0000 mg | ORAL_TABLET | Freq: Two times a day (BID) | ORAL | Status: DC

## 2018-07-28 NOTE — Discharge Instructions (Signed)
Information on my medicine - ELIQUIS (apixaban)  This medication education was reviewed with me or my healthcare representative as part of my discharge preparation.  The pharmacist that spoke with me during my hospital stay was:  Natale Lay, Student-PharmD  Why was Eliquis prescribed for you? Eliquis was prescribed to treat blood clots that may have been found in the veins of your legs (deep vein thrombosis) or in your lungs (pulmonary embolism) and to reduce the risk of them occurring again.  What do You need to know about Eliquis ? The starting dose is 10 mg (two 5 mg tablets) taken TWICE daily for the FIRST SEVEN (7) DAYS, then on 08/03/2018  the dose is reduced to ONE 5 mg tablet taken TWICE daily.  Eliquis may be taken with or without food.   Try to take the dose about the same time in the morning and in the evening. If you have difficulty swallowing the tablet whole please discuss with your pharmacist how to take the medication safely.  Take Eliquis exactly as prescribed and DO NOT stop taking Eliquis without talking to the doctor who prescribed the medication.  Stopping may increase your risk of developing a new blood clot.  Refill your prescription before you run out.  After discharge, you should have regular check-up appointments with your healthcare provider that is prescribing your Eliquis.    What do you do if you miss a dose? If a dose of ELIQUIS is not taken at the scheduled time, take it as soon as possible on the same day and twice-daily administration should be resumed. The dose should not be doubled to make up for a missed dose.  Important Safety Information A possible side effect of Eliquis is bleeding. You should call your healthcare provider right away if you experience any of the following: ? Bleeding from an injury or your nose that does not stop. ? Unusual colored urine (red or dark brown) or unusual colored stools (red or black). ? Unusual bruising for  unknown reasons. ? A serious fall or if you hit your head (even if there is no bleeding).  Some medicines may interact with Eliquis and might increase your risk of bleeding or clotting while on Eliquis. To help avoid this, consult your healthcare provider or pharmacist prior to using any new prescription or non-prescription medications, including herbals, vitamins, non-steroidal anti-inflammatory drugs (NSAIDs) and supplements.  This website has more information on Eliquis (apixaban): http://www.eliquis.com/eliquis/home

## 2018-07-28 NOTE — Care Management Important Message (Signed)
Important Message  Patient Details IM Letter given to Dessa Phi RN to present to the [facility] Name: Jeremy Chan MRN: [id number] Date of Birth: 1940/03/22   Medicare Important Message Given:  Yes     Kerin Salen 07/28/2018, 2:14 PM

## 2018-07-28 NOTE — Progress Notes (Signed)
Initial Nutrition Assessment  RD working remotely.   DOCUMENTATION CODES:   (unable to assess for malnutrition at this time.)  INTERVENTION:  - will order Ensure Enlive BID, each supplement provides 350 kcal and 20 grams of protein. - will order daily multivitamin with minerals. - continue to encourage PO intakes.   NUTRITION DIAGNOSIS:   Increased nutrient needs related to chronic illness, cancer and cancer related treatments as evidenced by estimated needs.  GOAL:   Patient will meet greater than or equal to 90% of their needs  MONITOR:   PO intake, Supplement acceptance, Labs, Weight trends  REASON FOR ASSESSMENT:   Consult Assessment of nutrition requirement/status, Poor PO  ASSESSMENT:   78 y.o. 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 He presented to the ED with SOB worse than baseline and worse with exertion. Patient reported decreased appetite and fatigue which have been ongoing since cancer diagnosis. He also reported ongoing cough PTA.  The most recent documented intakes per review of RN flow sheet were 75% of lunch on 6/19; 0% of breakfast, 5% of lunch, 25% of dinner on 6/20. Unable to talk with patient today; will need to continue to attempt.   Per chart review, current weight is 195 lb and weight on 6/11 at [facility] was 197 lb. This indicates 2 lb weight loss (1% body weight) in 1.5 weeks; not significant for time frame.   Per notes: acute pulmonary embolism, acute respiratory failure with hypoxia 2/2 embolism, bronchitis vs PNA, dehydration on admission, macrocytic anemia, metabolic acidosis, poor PO  intakes. MD note from today states anticipate d/c in the next 24 hours.     Medications reviewed; 75 mcg oral synthroid/day, 500 mg KPhos neutral BID, 5 mg deltasone/day.  Labs reviewed; Na: 132 mmol/l, Ca: 7.9 mg/dl, Phos: 2.2 mg/dl.  IVF; NS @ 50 ml/hr.    NUTRITION - FOCUSED PHYSICAL EXAM:  unable to complete at this time.   Diet Order:   Diet Order            Diet Heart Room service appropriate? Yes; Fluid consistency: Thin  Diet effective now              EDUCATION NEEDS:   Not appropriate for education at this time  Skin:  Skin Assessment: Reviewed RN Assessment  Last BM:  6/21  Height:   Ht Readings from Last 1 Encounters:  07/23/18 5' 9.5" (1.765 m)    Weight:   Wt Readings from Last 1 Encounters:  07/23/18 88.5 kg    Ideal Body Weight:  74.1 kg  BMI:  Body mass index is 28.38 kg/m.  Estimated Nutritional Needs:   Kcal:  2250-2500 kcal  Protein:  125-135 grams  Fluid:  >/= 2.3 L/day      Jarome Matin, MS, RD, LDN, [facility] Inpatient Clinical Dietitian Pager # [phone] After hours/weekend pager # [phone]

## 2018-07-28 NOTE — Progress Notes (Addendum)
Patients O2 sat 99% on  0.5L, and at 95% on RA while at rest prior to ambulation.  Patient ambulated hallway on RA.  O2 dropped to 85%.  Patient reports feeling slightly SOB, but "not as bad".  O2 sats increased to 93% when placed back on 1L O2, weaned back to .5 L after patient at rest for a few minutes.

## 2018-07-28 NOTE — TOC Transition Note (Signed)
Transition of Care [facility]) - CM/SW Discharge Note   Patient Details  Name: Jeremy Chan MRN: [id number] Date of Birth: 1940-10-03  [facility] CM/SW Contact:  Dessa Phi, RN Phone Number: 07/28/2018, 1:01 PM   Clinical Narrative: Provided w/pcp listing-[facility]. Provided w/goodrx website for meds-does not have part D script coverage-confirmed-eliquis cost $500-patient already has 30day free trial-he will f/u w/pcp once d/c for ongoing assessment & resources.No further CM needs.      Final next level of care: OP Rehab Barriers to Discharge: No Barriers Identified   Patient Goals and CMS Choice Patient states their goals for this hospitalization and ongoing recovery are:: go home      Discharge Placement                       Discharge Plan and Services                                     Social Determinants of Health (SDOH) Interventions     Readmission Risk Interventions No flowsheet data found.

## 2018-07-28 NOTE — Progress Notes (Signed)
SATURATION QUALIFICATIONS: (This note is used to comply with regulatory documentation for home oxygen)  Patient Saturations on Room Air at Rest = 95%  Patient Saturations on Room Air while Ambulating = 85%  Patient Saturations on 1 Liters of oxygen while Ambulating = 93%  Please briefly explain why patient needs home oxygen: O2 sats decreased to 85% on RA while ambulating.  Patient with mild SOB.  When 1L O2 applied, sats increased to 93%

## 2018-07-28 NOTE — Progress Notes (Signed)
PROGRESS NOTE    Jeremy Chan  PPI:[id number] DOB: 02/23/1940 DOA: 07/23/2018 PCP: Jeremy Rushing, MD   Brief Narrative:  HPI per Jeremy Chan on 07/23/2018 Jeremy Chan is a 78 y.o. 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  When EMS arrived his oxygen saturation was down to 92% he was placed on nonrebreather secondary to increased work of breathing although by that time his oxygen saturation was 95% room air He has had decreased appetite and fatigue which is been chronic since his diagnosis of cancer having had significant coughing no fevers no chest pain he denies any leg swelling   Recently seen by his oncologist CT 07/16/18 showed enlargement of a right renal neoplasm w/d interval extension into the R renal vein and IVC, enlargement of multiple necrotic mediastinal lymph node conglomerates, and increased nodularity chest and abdomen. Stable multiple sclerotic and lytic foci of osseous metastatic dise.  Plan is to hold immunotherapy w/ switch to cabozantinib   recent CT chest/abd/pelvis from 07/16/2018  Showed  progression of disease including enlargement of a right renal neoplasm with interval extension into the right renal vein and inferior vena cava, enlargement of multiple necrotic mediastinal lymph node conglomerates, and enlarging nodularity involving  the chest and abdomen   **Interim History Patient remains on a heparin drip and case was discussed with In house oncology who recommends full dose anticoagulation with Lovenox at discharge I spoke with the patient's primary oncologist who recommends either way starting him on a anticoagulant and is okay with doing a NOAC.Marland Kitchen  Echocardiogram was done and not show any right heart strain and no evidence of any right systolic dysfunction and lower extremity duplex was done and was negative for VTE.  After discussion with his primary oncologist Jeremy Chan, he recommends obtaining an MRI and continue heparin drip for now before transitioning to rule out any metastatic lesions with hemorrhagic transformation in the brain.  MRI done and was within limits for detection on a noncontrast examination but showed no intracranial metastatic disease.  Atrophy with chronic microvascular ischemic change was seen and slight tonsillar ectopia but there is no intracranial mass lesion or acute stroke noted as well.  Overnight the patient became hypotensive was given a fluid bolus and was then started back on IV fluids.  Pulmonary was consulted for his hemoptysis and pulmonary embolus and because of the submassive size Jeremy Chan recommending continuing heparin drip and transitioning to NOAC.  Jeremy Chan evaluated for his hemoptysis and likely thought it may be secondary to PE for anticoagulation and was minimal or possibly from bronchitis and recommends continuing the anticoagulation and transitioning to po Eliquis.    Currently the patient is not a candidate for thrombolytics.  His oxygen requirements are weaning but patient still feels anxious about not having oxygen but he did desaturate without O2.  We will attempt to walk screen today and continue fluids at a  low dose given that his sodium is still low .  Antibiotics were started with p.o. doxycycline given him questionable pneumonia versus bronchitis as WBCs are elevating  and procalcitonin is also going up slightly.  Chest x-ray did show slight progression of left lung base opacity earlier which may be due to increasing pleural effusion/electrophysical airspace disease/pneumonia and there is increase in right infrahilar opacity favoring atelectasis and there is concern for aspiration however speech therapy evaluated and currently recommending regular diet with thin liquids.  Assessment & Plan:   Principal Problem:   Pulmonary embolism (HCC) Active Problems:   Hyperlipidemia   Essential hypertension   Renal cell cancer, right (HCC)   CAD (coronary artery disease)   Dehydration   AKI (acute kidney injury) ([place])   Hypothyroidism   Hypopituitarism ([place])  Acute Pulmonary Embolism and likely submassive -Setting of hypercoagulable state secondary to his malignancies -Admitted to inpatient telemetry and will continue telemetry monitoring -Cycle cardiac troponins were less than 0.034 -CTA of the chest showed positive acute PE essentially within the main right pulmonary artery with extension to the right middle lobe and right lower lobe pulmonary arteries.  There is CT evidence of right heart strain with RV/LV ratio of 1.3 consistent with a recent submassive PE -Started on heparin drip and will continue for now -Discussed with medical oncology Jeremy Chan who recommends transitioning to Lovenox at discharge 1 mg/kg twice daily at discharge but will continue heparin drip for at least 48 hours currently; after further discussion and discussion with his primary oncologist Jeremy Chan at [facility], Jeremy Chan recommends starting the patient on a NOAC at D/C as he does not have a Preference but recommends obtaining an MRI of the brain given patient's worsened metastatic disease -Continue to hold home blood pressure medications and avoid hypotension -Echocardiogram done and showed a normal systolic function with a EF of 55 to 60% with cavity size that was normal.   There is no increase in left ventricular wall thickness and left ventricular diastolic parameters were consistent with impaired relaxation the right ventricle has a normal systolic function -No evidence of right heart strain or systolic dysfunction on echocardiogram -Lower extremity Dopplers were negative for VTE -Have consulted case management for cost of full dose Lovenox 1 milligram per kilogram and this is on patient's formulary and after discussion with patient's primary oncologist he recommends starting a NOAC. Apixaban started  -Obtained MRI to evaluate the brain for any metastatic lesions given concern for any hemorrhagic transformation; -MRI was negative as above and pulmonary evaluated and recommended continuing anticoagulation and have now transitioned to p.o. apixaban -Patient had 2 small sputum's that were bloody and this is likely partial clot and may be related to anticoagulation but pulmonary evaluated recommending continue anticoagulation at this time -We will need home ambulatory screen prior to discharge and this is still pending to be done   Acute Respiratory Failure with hypoxia secondary to above -Patient was placed on 2 L of supplemental oxygen via nasal cannula has now been weaned to 1 L but back up to 2 Liters as O2 saturation was 87% on RA while he was talking  -Continue pulse oximetry and maintain O2 saturation greater than 92% -Continue with supplemental oxygen via nasal cannula and wean O2 as tolerated -Repeat CXR this AM showed "Stable cardiomegaly. No pneumothorax is noted. Elevated left hemidiaphragm is noted with stable left basilar atelectasis or infiltrate. Minimal right pleural effusion is noted with associated atelectasis or infiltrate. Bony  thorax is unremarkable." -Ater my discussion with pulmonary will empirically start the patient on antibiotics with doxycycline for possible bronchitis and or pneumonia -in the setting of PE -We will need home Ambulatory screen  prior to discharge a\  Bronchitis versus Pneumonia -Patient WBC is elevating and went from 13.6 -> 17.1 -> 13.8 -Procalcitonin level was 0.50 and is now worsened to 1.09 and is now 1.03 -After discussion with pulmonary will start the patient on doxycycline -We will continue to monitor patient's CBC and procalcitonin level in the a.m. -Patient will need a walk screen prior to discharge and this is pending to bed done -We will add breathing treatments as necessary place the patient on albuterol Neb every 2 as needed  Hyperlipidemia  -Stable continue home medications with Atorvastatin 40 g p.o. nightly  Essential Hypertension blood currently now hypotensive -Continue hold home medications with Amlodipine-Benazepril Combination given PE and AKI -C/w IVF with NS a 50 mL/hr  Metastatic Renal cell cancer, Right Hx of Prostate Cancer -[facility] -Renal cancer has metastasized to the lung, the bone, the pelvis as well as soft tissue; patient is currently on nivolumab -Also had a history of laryngeal cancer that involved bilateral vocal cord status post radiation and history of prostate cancers treated with brachytherapy implantations over 2000 -Currently have change in immunotherapy given failure of a previous treatment and progression have not started new therapy yet -CTA showed worsening metastatic disease as evidenced by multiple pathologically enlarged mediastinal lymph nodes in addition to enlarging sub-pleural pulmonary nodules on the left and metastatic deposit that appears to be centered within or adjacent to the left hemidiaphragm -I spoke with Jeremy Chan his primary oncologist who is planning on starting a new immunotherapy at discharge but Jeremy Chan was concerned about possible metastatic lesions to the brain given his worsening metastatic disease acutely so he recommends obtaining an MRI of the brain without contrast for now to make sure the patient does not have  any metastatic lesions to the brain with possible bleeding as he is on anticoagulation -MRI was done and within limits for detection on a noncontrast examination but showed no intracranial metastatic disease.  Atrophy with chronic microvascular ischemic change was seen and slight tonsillar ectopia but there is no intracranial mass lesion or acute stroke noted as well.   CAD  with history of stents in the early 2000 -Stable continue home medications with ASA 81 mg po Daily and Atorvastatin 40 mg po Daily   Dehydration  -IV fluid hydration is now stopped but now will resume as the patient received a bolus overnight due to his hypotension -BUN/Cr was improving slightly and went from 24/1.39 -> 20/1.20 -> 20/1.11 -> 19/1.27 -> 21/1.22 -> 20/1.17  AKI (acute kidney injury) ([place]) most likely secondary to dehydration -We will bolus overnight and IV fluid hydration is now resumed at 75 mL's per hour -BUN/Cr went from 24/1.39 -> 20/1.17 -Continue to Monitor and follow Renal Fxn -Avoid Nephrotoxic Medications if possible   Pan-Hypopituitarism Hypothyroidism   -Continue Home Prednisone and Synthroid -Patient sees Dr. Herschel [place] in endocrinology and also receives testosterone every 2 weeks  Macrocytic Anemia -Patient's Hb/Hct went from 12.2/38.1 -> 9.8/30.5 -Checked Anemia Panel and showed an iron level of 24, U IBC of 188, TIBC 212, saturation ratios of 11%, ferritin level 1026, folate level 4.6, and vitamin B12 level 222 -Continue to Monitor for S/Sx of Bleeding as patient is anticoagulated with Heparin gtt; Currently no overt signs of bleeding -  Repeat CBC in AM   Thrombocytopenia -Patient's platelet count is now 152,000 -Continue to monitor for signs and symptoms of bleeding; currently no overt bleeding noted -Continue monitor carefully as patient is on a heparin drip -Repeat CBC in a.m.  Leukocytosis -Likely reactive in the setting of PE but ? Bronchitis vs. PNA -Was Trending downward as  patient's WBC went from 13.5 is now 10.6 but now is trending back up again and is 13.6 -> 16.4 -> 17.1 -> 13.8 -Started patient on po Doxycycline and will continue  -Continue monitor for signs and symptoms of infection -Repeat CBC in a.m.  Hyponatremia -Patient's sodium level was 130 yesterday and is now 132; In the setting of poor po Intake as he is not eating much -C/w IVF Hydration with NS at 50 mL/hr  Metabolic Acidosis -Patient CO2 was 20 and anion gap was 12; now CO2 is 21 and AG is 10 -Continue with IV fluid hydration as above -Continue monitor and trend and repeat CMP in a.m.  Poor Po Intake/FTT -Nutritionist Consulted -Will get PT/OT to evaluate and Treat -C/w Gentle IVF with NS at 50 mL/hr though today  -Repeat CXR in AM   HypoPhosphatemia -Patient's phosphorus levels morning was 2.2 -Replete with p.o. K-Phos Neutral 500 mg BID x2 doses  -Continue monitor and replete as necessary -Repeat phosphorus level in a.m.  DVT prophylaxis: Anticoagulated with Heparin gtt and will transitioned to Eliquis today  Code Status: FULL CODE  Family Communication: No family present at bedside but I spoke with the son over the phone Disposition Plan: Remain Inpatient for continued Workup and treatment and Anticipate D/C in the next 24- hours pending PT/OT Evaluation   Consultants:   None but case was discussed with Medical Oncology Jeremy Chan  Case was also discussed with [facility] oncology Jeremy Chan  Pulmonary Jeremy Chan   Procedures:  ECHOCARDIOGRAM 07/23/2018 IMPRESSIONS    1. The left ventricle has normal systolic function, with an ejection fraction of 55-60%. The cavity size was normal. Left ventricular diastolic Doppler parameters are consistent with impaired relaxation.  2. The right ventricle has normal systolic function. The cavity was normal. There is no increase in right ventricular wall thickness.  FINDINGS  Left Ventricle: The left ventricle has normal  systolic function, with an ejection fraction of 55-60%. The cavity size was normal. There is no increase in left ventricular wall thickness. Left ventricular diastolic Doppler parameters are consistent with  impaired relaxation.  Right Ventricle: The right ventricle has normal systolic function. The cavity was normal. There is no increase in right ventricular wall thickness.  Left Atrium: Left atrial size was normal in size.  Right Atrium: Right atrial size was normal in size. Right atrial pressure is estimated at 10 mmHg.  Interatrial Septum: No atrial level shunt detected by color flow Doppler.  Pericardium: There is no evidence of pericardial effusion.  Mitral Valve: The mitral valve is normal in structure. Mitral valve regurgitation is not visualized by color flow Doppler.  Tricuspid Valve: The tricuspid valve is normal in structure. Tricuspid valve regurgitation was not visualized by color flow Doppler.  Aortic Valve: The aortic valve is normal in structure. Aortic valve regurgitation was not visualized by color flow Doppler.  Pulmonic Valve: The pulmonic valve was normal in structure. Pulmonic valve regurgitation is not visualized by color flow Doppler.  Venous: The inferior vena cava is normal in size with greater than 50% respiratory variability.    +--------------+--------++  LEFT VENTRICLE            +----------------+---------++ +--------------+--------++  Diastology                    PLAX 2D                   +----------------+---------++ +--------------+--------++  LV e' lateral:   9.63 cm/s    LVIDd:         5.15 cm    +----------------+---------++ +--------------+--------++  LV E/e' lateral: 6.4          LVIDs:         3.04 cm    +----------------+---------++ +--------------+--------++  LV e' medial:    7.94 cm/s    LV PW:         1.04 cm    +----------------+---------++ +--------------+--------++  LV E/e' medial:  7.8          LV IVS:        0.75 cm     +----------------+---------++ +--------------+--------++  LVOT diam:     2.00 cm    +--------------+--------++  LV SV:         90 ml      +--------------+--------++  LV SV Index:   43.06      +--------------+--------++  LVOT Area:     3.14 cm   +--------------+--------++                            +--------------+--------++  +---------------+----------++  RIGHT VENTRICLE              +---------------+----------++  RV S prime:     11.20 cm/s   +---------------+----------++  +---------------+-------++-----------++  LEFT ATRIUM              Index         +---------------+-------++-----------++  LA diam:        3.50 cm  1.70 cm/m    +---------------+-------++-----------++  LA Vol (A2C):   29.3 ml  14.26 ml/m   +---------------+-------++-----------++  LA Vol (A4C):   42.4 ml  20.64 ml/m   +---------------+-------++-----------++  LA Biplane Vol: 36.4 ml  17.72 ml/m   +---------------+-------++-----------++  +------------+-----------++  AORTIC VALVE               +------------+-----------++  LVOT Vmax:   70.90 cm/s    +------------+-----------++  LVOT Vmean:  46.900 cm/s   +------------+-----------++  LVOT VTI:    0.157 m       +------------+-----------++   +-------------+-------++  AORTA                   +-------------+-------++  Ao Root diam: 3.10 cm   +-------------+-------++  +--------------+----------++  MITRAL VALVE                +--------------+-------+ +--------------+----------++  SHUNTS                   MV Area (PHT): 2.73 cm     +--------------+-------+ +--------------+----------++  Systemic VTI:  0.16 m    MV PHT:        80.62 msec   +--------------+-------+ +--------------+----------++  Systemic Diam: 2.00 cm   MV Decel Time: 278 msec     +--------------+-------+ +--------------+----------++ +--------------+----------++  MV E velocity: 61.60 cm/s   +--------------+----------++  MV A velocity: 75.70 cm/s   +--------------+----------++  MV  E/A ratio:  0.81         +--------------+----------++   Antimicrobials:  Anti-infectives (From admission, onward)   Start     Dose/Rate Route Frequency  Ordered Stop   07/27/18 1100  doxycycline (VIBRA-TABS) tablet 100 mg     100 mg Oral Every 12 hours 07/27/18 1046 08/06/18 0959   07/27/18 1000  ceFEPIme (MAXIPIME) 1 g in sodium chloride 0.9 % 100 mL IVPB  Status:  Discontinued     1 g 200 mL/hr over 30 Minutes Intravenous Every 12 hours 07/27/18 0910 07/27/18 1046     Subjective: Seen and examined at bedside and sitting up in bed in no acute distress and felt better with oxygen.  Discussed with the nurse that he did desaturate while speaking on room air and was placed back on 2 L.  No nausea or vomiting.  Denies chest pain, lightheadedness or dizziness.  Only complaint of "floaters in his eye" and described as if he was seeing a bug crawl across his face".  Denied any vision loss and states this was only transient.  No other concerns or complaints at this time.  Objective: Vitals:   07/27/18 1619 07/27/18 2106 07/27/18 2203 07/28/18 0516  BP: (!) 110/58 105/63  107/61  Pulse: 92 84  85  Resp: (!) 23 16  18   Temp: 98 F (36.7 C) 98.7 F (37.1 C)  97.9 F (36.6 C)  TempSrc: Oral Oral  Oral  SpO2: 95% (!) 87% 99% 97%  Weight:      Height:        Intake/Output Summary (Last 24 hours) at 07/28/2018 1427 Last data filed at 07/28/2018 0700 Gross per 24 hour  Intake 1538.03 ml  Output 1300 ml  Net 238.03 ml   Filed Weights   07/23/18 1824  Weight: 88.5 kg   Examination: Physical Exam:  Constitutional: Chronically ill-appearing Caucasian male who was overweight and in no appreciable distress but does appear mildly uncomfortable and he is eating a little bit better today. Eyes: Lids and conjunctive are normal.  Sclera anicteric ENMT: External ears and nose appear normal.  Grossly normal hearing Neck: Appears supple no JVD Respiratory: Diminished auscultation bilaterally no  appreciable wheezing, rales, rhonchi.  Patient did have some coarse breath sounds and had unlabored breathing and he is wearing 2 L of supplemental oxygen via nasal cannula now Cardiovascular: Regular rate and rhythm.  Has a 2/6 systolic murmur.  Has trace lower extremity edema Abdomen: Soft, nontender, distended secondary to body habitus.  Bowel sounds present GU: Deferred Musculoskeletal: No contractures or cyanosis.  No joint fomites in upper lower extremities Skin: Skin appears warm and dry no appreciable rashes or lesions on a limited skin evaluation Neurologic: Cranial nerves II through XII grossly intact with no appreciable focal deficits.  Romberg sign cerebellar reflexes were not assessed Psychiatric: Patient is awake, alert, oriented.  He is anxious but appears to have a normal judgment and insight  Data Reviewed: I have personally reviewed following labs and imaging studies  CBC: Recent Labs  Lab 07/23/18 1257 07/24/18 0358 07/25/18 0446 07/26/18 0525 07/27/18 0503 07/28/18 0451  WBC 13.5* 10.6* 13.6* 16.4* 17.1* 13.8*  NEUTROABS 10.9*  --  10.2* 12.8* 13.7* 11.2*  HGB 12.2* 12.3* 11.8* 11.1* 10.2* 9.8*  HCT 38.1* 37.7* 38.0* 34.8* 32.4* 30.5*  MCV 101.3* 101.3* 102.4* 101.2* 102.5* 101.0*  PLT 155 145* 135* 145* 148* 323   Basic Metabolic Panel: Recent Labs  Lab 07/24/18 0358 07/25/18 0446 07/26/18 0525 07/27/18 0503 07/28/18 0451  NA 136 135 134* 130* 132*  K 4.0 4.5 4.0 4.6 4.0  CL 103 102 102 99 101  CO2 22 22 20*  22 21*  GLUCOSE 94 93 107* 102* 104*  BUN 20 20 19 21 20   CREATININE 1.20 1.11 1.27* 1.22 1.17  CALCIUM 8.5* 8.5* 8.5* 7.8* 7.9*  MG 2.3 2.2 2.0 2.1 2.0  PHOS 2.6 2.5 2.5 2.4* 2.2*   GFR: Estimated Creatinine Clearance: 56.8 mL/min (by C-G formula based on SCr of 1.17 mg/dL). Liver Function Tests: Recent Labs  Lab 07/24/18 0358 07/25/18 0446 07/26/18 0525 07/27/18 0503 07/28/18 0451  AST 13* 15 12* 10* 11*  ALT 11 9 11 8 10   ALKPHOS  64 60 58 53 59  BILITOT 0.6 0.8 1.1 0.9 0.3  PROT 6.4* 6.6 6.7 6.1* 6.0*  ALBUMIN 3.6 3.6 3.3* 3.0* 2.8*   No results for input(s): LIPASE, AMYLASE in the last 168 hours. No results for input(s): AMMONIA in the last 168 hours. Coagulation Profile: Recent Labs  Lab 07/23/18 1857  INR 1.0   Cardiac Enzymes: Recent Labs  Lab 07/23/18 1857 07/23/18 2151 07/24/18 0358 07/24/18 0949  TROPONINI <0.03 <0.03 <0.03 <0.03   BNP (last 3 results) No results for input(s): PROBNP in the last 8760 hours. HbA1C: No results for input(s): HGBA1C in the last 72 hours. CBG: No results for input(s): GLUCAP in the last 168 hours. Lipid Profile: No results for input(s): CHOL, HDL, LDLCALC, TRIG, CHOLHDL, LDLDIRECT in the last 72 hours. Thyroid Function Tests: No results for input(s): TSH, T4TOTAL, FREET4, T3FREE, THYROIDAB in the last 72 hours. Anemia Panel: Recent Labs    07/26/18 0525  VITAMINB12 222  FOLATE 4.6*  FERRITIN 1,026*  TIBC 212*  IRON 24*  RETICCTPCT 1.1   Sepsis Labs: Recent Labs  Lab 07/26/18 0926 07/27/18 0503 07/28/18 0451  PROCALCITON 0.50 1.09 1.03    Recent Results (from the past 240 hour(s))  SARS Coronavirus 2 (CEPHEID - Performed in [place] hospital lab), Hosp Order     Status: None   Collection Time: 07/23/18  6:57 PM   Specimen: Nasopharyngeal Swab  Result Value Ref Range Status   SARS Coronavirus 2 NEGATIVE NEGATIVE Final    Comment: (NOTE) If result is NEGATIVE SARS-CoV-2 target nucleic acids are NOT DETECTED. The SARS-CoV-2 RNA is generally detectable in upper and lower  respiratory specimens during the acute phase of infection. The lowest  concentration of SARS-CoV-2 viral copies this assay can detect is 250  copies / mL. A negative result does not preclude SARS-CoV-2 infection  and should not be used as the sole basis for treatment or other  patient management decisions.  A negative result may occur with  improper specimen collection /  handling, submission of specimen other  than nasopharyngeal swab, presence of viral mutation(s) within the  areas targeted by this assay, and inadequate number of viral copies  (<250 copies / mL). A negative result must be combined with clinical  observations, patient history, and epidemiological information. If result is POSITIVE SARS-CoV-2 target nucleic acids are DETECTED. The SARS-CoV-2 RNA is generally detectable in upper and lower  respiratory specimens dur ing the acute phase of infection.  Positive  results are indicative of active infection with SARS-CoV-2.  Clinical  correlation with patient history and other diagnostic information is  necessary to determine patient infection status.  Positive results do  not rule out bacterial infection or co-infection with other viruses. If result is PRESUMPTIVE POSTIVE SARS-CoV-2 nucleic acids MAY BE PRESENT.   A presumptive positive result was obtained on the submitted specimen  and confirmed on repeat testing.  While 2019 novel coronavirus  (  SARS-CoV-2) nucleic acids may be present in the submitted sample  additional confirmatory testing may be necessary for epidemiological  and / or clinical management purposes  to differentiate between  SARS-CoV-2 and other Sarbecovirus currently known to infect humans.  If clinically indicated additional testing with an alternate test  methodology [phone]) is advised. The SARS-CoV-2 RNA is generally  detectable in upper and lower respiratory sp ecimens during the acute  phase of infection. The expected result is Negative. Fact Sheet for Patients:  StrictlyIdeas.no Fact Sheet for Healthcare Providers: BankingDealers.co.za This test is not yet approved or cleared by the [place] FDA and has been authorized for detection and/or diagnosis of SARS-CoV-2 by FDA under an Emergency Use Authorization (EUA).  This EUA will remain in effect (meaning this  test can be used) for the duration of the COVID-19 declaration under Section 564(b)(1) of the Act, 21 U.S.C. section 360bbb-3(b)(1), unless the authorization is terminated or revoked sooner. Performed at [facility], [place] [address], [place], [place] [address]   MRSA PCR Screening     Status: None   Collection Time: 07/27/18  8:43 AM   Specimen: Nasopharyngeal  Result Value Ref Range Status   MRSA by PCR NEGATIVE NEGATIVE Final    Comment:        The GeneXpert MRSA Assay (FDA approved for NASAL specimens only), is one component of a comprehensive MRSA colonization surveillance program. It is not intended to diagnose MRSA infection nor to guide or monitor treatment for MRSA infections. Performed at [facility], [place] [address], [place], [place] [address]   Expectorated sputum assessment w rflx to resp cult     Status: None   Collection Time: 07/27/18  8:44 AM   Specimen: Expectorated Sputum  Result Value Ref Range Status   Specimen Description EXPECTORATED SPUTUM  Final   Special Requests NONE  Final   Sputum evaluation   Final    THIS SPECIMEN IS ACCEPTABLE FOR SPUTUM CULTURE Performed at [facility], [place] [address] [place], [place] [address]    Report Status 07/27/2018 FINAL  Final  Culture, respiratory     Status: None (Preliminary result)   Collection Time: 07/27/18  8:44 AM  Result Value Ref Range Status   Specimen Description   Final    EXPECTORATED SPUTUM Performed at [place] [address], [place], [place] [address]    Special Requests   Final    NONE Reflexed from [id number] Performed at [place] [address] [place], [place] [address]    Gram Stain   Final    ABUNDANT SQUAMOUS EPITHELIAL CELLS PRESENT ABUNDANT WBC PRESENT, PREDOMINANTLY PMN ABUNDANT GRAM POSITIVE COCCI ABUNDANT GRAM NEGATIVE RODS MODERATE GRAM POSITIVE RODS    Culture   Final     TOO YOUNG TO READ Performed at [facility], [place] [address] [place],  [address]    Report Status PENDING  Incomplete  SARS Coronavirus 2 (CEPHEID - Performed in [place] hospital lab), Hosp Order     Status: None   Collection Time: 07/27/18  8:52 AM   Specimen: Nasopharyngeal Swab  Result Value Ref Range Status   SARS Coronavirus 2 NEGATIVE NEGATIVE Final    Comment: (NOTE) If result is NEGATIVE SARS-CoV-2 target nucleic acids are NOT DETECTED. The SARS-CoV-2 RNA is generally detectable in upper and lower  respiratory specimens during the acute phase of infection. The lowest  concentration of SARS-CoV-2 viral copies  this assay can detect is 250  copies / mL. A negative result does not preclude SARS-CoV-2 infection  and should not be used as the sole basis for treatment or other  patient management decisions.  A negative result may occur with  improper specimen collection / handling, submission of specimen other  than nasopharyngeal swab, presence of viral mutation(s) within the  areas targeted by this assay, and inadequate number of viral copies  (<250 copies / mL). A negative result must be combined with clinical  observations, patient history, and epidemiological information. If result is POSITIVE SARS-CoV-2 target nucleic acids are DETECTED. The SARS-CoV-2 RNA is generally detectable in upper and lower  respiratory specimens dur ing the acute phase of infection.  Positive  results are indicative of active infection with SARS-CoV-2.  Clinical  correlation with patient history and other diagnostic information is  necessary to determine patient infection status.  Positive results do  not rule out bacterial infection or co-infection with other viruses. If result is PRESUMPTIVE POSTIVE SARS-CoV-2 nucleic acids MAY BE PRESENT.   A presumptive positive result was obtained on the submitted specimen  and confirmed on repeat testing.  While 2019 novel coronavirus    (SARS-CoV-2) nucleic acids may be present in the submitted sample  additional confirmatory testing may be necessary for epidemiological  and / or clinical management purposes  to differentiate between  SARS-CoV-2 and other Sarbecovirus currently known to infect humans.  If clinically indicated additional testing with an alternate test  methodology [phone]) is advised. The SARS-CoV-2 RNA is generally  detectable in upper and lower respiratory sp ecimens during the acute  phase of infection. The expected result is Negative. Fact Sheet for Patients:  StrictlyIdeas.no Fact Sheet for Healthcare Providers: BankingDealers.co.za This test is not yet approved or cleared by the [place] FDA and has been authorized for detection and/or diagnosis of SARS-CoV-2 by FDA under an Emergency Use Authorization (EUA).  This EUA will remain in effect (meaning this test can be used) for the duration of the COVID-19 declaration under Section 564(b)(1) of the Act, 21 U.S.C. section 360bbb-3(b)(1), unless the authorization is terminated or revoked sooner. Performed at [facility], [place] [address], [place], [place] [address]      Radiology Studies: Dg Chest Port 1 View  Result Date: 07/28/2018 CLINICAL DATA:  Shortness of breath. EXAM: PORTABLE CHEST 1 VIEW COMPARISON:  Radiograph of July 26, 2018. FINDINGS: Stable cardiomegaly. No pneumothorax is noted. Elevated left hemidiaphragm is noted with stable left basilar atelectasis or infiltrate. Minimal right pleural effusion is noted with associated atelectasis or infiltrate. Bony thorax is unremarkable. IMPRESSION: Stable bilateral lung opacities as described above. Electronically Signed   By: Marijo Conception M.D.   On: 07/28/2018 07:42   Dg Chest Port 1 View  Result Date: 07/26/2018 CLINICAL DATA:  Weakness and shortness of breath. EXAM: PORTABLE CHEST 1 VIEW COMPARISON:  Radiograph earlier  this day at 0456 hour, chest CT 07/23/2018 FINDINGS: Unchanged heart size and mediastinal contours. Unchanged elevation of left hemidiaphragm. Slight progression of left lung base opacity from earlier today. Increased right infrahilar opacity. Peripheral nodular density in the left lower lung corresponding to pulmonary metastasis. Unchanged heart size and mediastinal contours. Left hilar rounded density representing metastatic adenopathy on CT. No pulmonary edema. No pneumothorax. IMPRESSION: 1. Slight progression in left lung base opacity from earlier today, which may be due to increasing pleural effusion, atelectasis, or airspace disease/pneumonia 2. Increased right infrahilar opacity favoring atelectasis. However  recommend correlation for aspiration risk factors. Electronically Signed   By: Keith Rake M.D.   On: 07/26/2018 20:34   Scheduled Meds:  apixaban  10 mg Oral BID   [START ON 08/03/2018] apixaban  5 mg Oral BID   aspirin EC  81 mg Oral Daily   atorvastatin  40 mg Oral Daily   doxycycline  100 mg Oral Q12H   levothyroxine  75 mcg Oral QAC breakfast   phosphorus  500 mg Oral BID   predniSONE  5 mg Oral Q breakfast   Continuous Infusions:  sodium chloride 50 mL/hr at 07/28/18 0827    LOS: 4 days   Kerney Elbe, DO [facility] PAGER is on [place]  If 7PM-7AM, please contact night-coverage [email or web address] Password [facility] 07/28/2018, 2:27 PM

## 2018-07-28 NOTE — Progress Notes (Signed)
  Speech Language Pathology Treatment: Dysphagia  Patient Details Name: Jeremy Chan MRN: [id number] DOB: 1940/07/18 Today's Date: 07/28/2018 Time: 1410-1435 25 minutes  Assessment / Plan / Recommendation Clinical Impression  Pt seen at bedside for assessment of diet tolerance and for education, following up after BSE completed 07/27/2018. Pt was sleeping upon arrival of SLP, but awakened easily to voice. Pt reports no difficulty swallowing, and indicates his appetite is improving. SLP provided education regarding risk of post-radiation changes, given history of vocal fold cancer with radiation treatment. No overt s/s aspiration observed with thin liquid or solid textures, however, pt is at increased risk for silent aspiration given history. Pt was encouraged to notify PCP if difficulties arise after DC, or if lungs do not improve in a timely fashion. Instrumental assessment via MBS may be beneficial, as post-radiation dysphagia is not uncommon, even years after treatments.    HPI Mr Desmen Schoffstall, 78y/o m presented to ED with worsened shortness of breath upon exertion . Reduced appetite and fatigue have been chronic since his diagnosis of cancer. 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 He reports no difficulty with swallowing. CT is positive for PE.     SLP Plan  Continue with current plan of care       Recommendations  Diet recommendations: Regular;Thin liquid Liquids provided via: Straw;Cup Medication Administration: Whole meds with liquid Supervision: Patient able to self feed;Intermittent supervision to cue for compensatory strategies Compensations: Slow rate;Small sips/bites;Minimize environmental distractions Postural Changes and/or Swallow Maneuvers: Seated upright 90 degrees;Upright 30-60  min after meal                Oral Care Recommendations: Oral care BID Follow up Recommendations: Other (comment)(TBD) SLP Visit Diagnosis: Dysphagia, unspecified (R13.10) Plan: Continue with current plan of care       [place], CCC-SLP Speech Language Pathologist [phone]  Shonna Chock 07/28/2018, 2:30 PM

## 2018-07-28 NOTE — Progress Notes (Signed)
NAME:  Jeremy Chan, MRN:  [id number], DOB:  07/22/1940, LOS: 4 ADMISSION DATE:  07/23/2018, CONSULTATION DATE: 07/26/2018 REFERRING MD: Dr. Alfredia Ferguson, CHIEF COMPLAINT: Pulmonary embolism  Brief History   Patient was admitted with shortness of breath History of renal cancer with metastases laryngeal cancer Prostate cancer-treated with brachytherapy Pleural effusion requiring VATS procedure until pleurodesis in the past  History of present illness   Presented to the hospital worsening shortness of breath Was placed on supplemental oxygen by EMS Noted to have progressive disease  Past Medical History   Past Medical History:  Diagnosis Date  . 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 Age-related bone loss   . Erectile dysfunction   . GERD (gastroesophageal reflux disease)   . Hyperkalemia   . Hyperlipidemia   . Hypertension   . Other dietary vitamin B12 deficiency anemia   . Prostate cancer ([place])   . Urinary tract infection   . Vitamin D deficiency      Significant Hospital Events   Hemoptysis-initially red blood but otherwise altered blood-minimal Hypotension-improving with fluid resuscitation Still requiring oxygen segmentation  Consults:  PCCM 6/20  Procedures:    Significant Diagnostic Tests:  CT chest IMPRESSION: 1. Positive for acute pulmonary embolus essentially within the main right pulmonary artery with extension into the right middle lobe and right lower lobe pulmonary arteries. Positive for acute PE with CT evidence of right heart strain (RV/LV Ratio = 1.3) consistent with at least submassive (intermediate risk) PE. The presence of right heart strain has been associated with an increased risk of morbidity and mortality. 2. Worsening metastatic disease as evidenced by multiple pathologically enlarged mediastinal lymph nodes as detailed above in addition to multiple enlarging subpleural pulmonary nodules on the left and a metastatic deposit  that appears to be centered within or adjacent to the left hemidiaphragm. 3. Large partially visualized right renal mass. 4. Elevation of the left hemidiaphragm. There is a trace left-sided pleural effusion with adjacent atelectasis.  Echocardiogram: Normal ejection fraction, no right ventricular dysfunction  Interim history/subjective:  Feeling relatively well Feels a little bit better Still requires oxygen supplementation  Objective   Blood pressure 107/61, pulse 85, temperature 97.9 F (36.6 C), temperature source Oral, resp. rate 18, height 5' 9.5" (1.765 m), weight 88.5 kg, SpO2 97 %.        Intake/Output Summary (Last 24 hours) at 07/28/2018 3474 Last data filed at 07/28/2018 0700 Gross per 24 hour  Intake 1538.03 ml  Output 1600 ml  Net -61.97 ml   Filed Weights   07/23/18 1824  Weight: 88.5 kg    Examination: Elderly gentleman, appears comfortable Moist oral mucosa Decreased breath sounds bilaterally S1-S2 appreciated Abdomen is soft, bowel sounds appreciated No clubbing, no edema neurologically he is alert oriented x3   Resolved Hospital Problem list     Assessment & Plan:   Pulmonary embolism, submassive Continue anticoagulation On Eliquis  Hemoptysis -Multifactorial -No massive hemoptysis, this may be related to bronchitis -Continue anticoagulation  Hypotension -Likely related to dehydration Improving  Bronchitis Empirically started on doxycycline-7 to 10-day treatment will be appropriate  Hypotension Likely related to dehydration Does not appear to be significantly symptomatic at present  History of talc pleurodesis CT scan findings liver related to pleural thickening, I do not think he has significant effusion  Hypopituitarism  Continue steroids and Synthroid  Essential hypertension Hold antihypertensives  Appears to be stabilizing May require oxygen supplementation at home  Evaluate for home O2  need Saturations less than 89%  on room air at rest and without relation will qualify for oxygen supplementation  PCCM will sign off at present Call as needed Stable for discharge from a pulmonary perspective  Best practice:  Diet: Heart healthy DVT prophylaxis: On full anticoagulation Mobility: Ambulate as tolerated Code Status: Full code Family Communication: Continue management on MedSurg floor Disposition:   Labs   CBC: Recent Labs  Lab 07/23/18 1257 07/24/18 0358 07/25/18 0446 07/26/18 0525 07/27/18 0503 07/28/18 0451  WBC 13.5* 10.6* 13.6* 16.4* 17.1* 13.8*  NEUTROABS 10.9*  --  10.2* 12.8* 13.7* 11.2*  HGB 12.2* 12.3* 11.8* 11.1* 10.2* 9.8*  HCT 38.1* 37.7* 38.0* 34.8* 32.4* 30.5*  MCV 101.3* 101.3* 102.4* 101.2* 102.5* 101.0*  PLT 155 145* 135* 145* 148* 203    Basic Metabolic Panel: Recent Labs  Lab 07/24/18 0358 07/25/18 0446 07/26/18 0525 07/27/18 0503 07/28/18 0451  NA 136 135 134* 130* 132*  K 4.0 4.5 4.0 4.6 4.0  CL 103 102 102 99 101  CO2 22 22 20* 22 21*  GLUCOSE 94 93 107* 102* 104*  BUN 20 20 19 21 20   CREATININE 1.20 1.11 1.27* 1.22 1.17  CALCIUM 8.5* 8.5* 8.5* 7.8* 7.9*  MG 2.3 2.2 2.0 2.1 2.0  PHOS 2.6 2.5 2.5 2.4* 2.2*   GFR: Estimated Creatinine Clearance: 56.8 mL/min (by C-G formula based on SCr of 1.17 mg/dL). Recent Labs  Lab 07/25/18 0446 07/26/18 0525 07/26/18 0926 07/27/18 0503 07/28/18 0451  PROCALCITON  --   --  0.50 1.09 1.03  WBC 13.6* 16.4*  --  17.1* 13.8*    Liver Function Tests: Recent Labs  Lab 07/24/18 0358 07/25/18 0446 07/26/18 0525 07/27/18 0503 07/28/18 0451  AST 13* 15 12* 10* 11*  ALT 11 9 11 8 10   ALKPHOS 64 60 58 53 59  BILITOT 0.6 0.8 1.1 0.9 0.3  PROT 6.4* 6.6 6.7 6.1* 6.0*  ALBUMIN 3.6 3.6 3.3* 3.0* 2.8*   No results for input(s): LIPASE, AMYLASE in the last 168 hours. No results for input(s): AMMONIA in the last 168 hours.  ABG    Component Value Date/Time   PHART 7.455 (H) 08/07/2016 0335   PCO2ART 35.8  08/07/2016 0335   PO2ART 87.2 08/07/2016 0335   HCO3 24.9 08/07/2016 0335   O2SAT 96.6 08/07/2016 0335     Coagulation Profile: Recent Labs  Lab 07/23/18 1857  INR 1.0    Cardiac Enzymes: Recent Labs  Lab 07/23/18 1857 07/23/18 2151 07/24/18 0358 07/24/18 0949  TROPONINI <0.03 <0.03 <0.03 <0.03    HbA1C: No results found for: HGBA1C  CBG: No results for input(s): GLUCAP in the last 168 hours.  Review of Systems:   Review of Systems  Constitutional: Negative for fever.  Respiratory: Positive for cough.      Past Medical History  He,  has a past medical history of Adenocarcinoma of left lung, stage 4 ([place]) (08/13/2016), Age-related bone loss, Erectile dysfunction, GERD (gastroesophageal reflux disease), Hyperkalemia, Hyperlipidemia, Hypertension, Other dietary vitamin B12 deficiency anemia, Prostate cancer ([place]), Urinary tract infection, and Vitamin D deficiency.   Surgical History    Past Surgical History:  Procedure Laterality Date  . CHEST TUBE INSERTION Left 08/06/2016   Procedure: INSERTION PLEURAL DRAINAGE CATHETER, left;  Surgeon: [name], MD;  Location: [place];  Service: Thoracic;  Laterality: Left;  . PLEURAL BIOPSY Left 08/06/2016   Procedure: PLEURAL BIOPSY;  Surgeon: [name], MD;  Location: [place];  Service: Thoracic;  Laterality: Left;  . REMOVAL OF PLEURAL DRAINAGE CATHETER Left 08/28/2016   Procedure: REMOVAL OF PLEURAL DRAINAGE CATHETER;  Surgeon: [name], MD;  Location: [place];  Service: Thoracic;  Laterality: Left;  . TALC PLEURODESIS Left 08/06/2016   Procedure: Pietro Cassis;  Surgeon: [name], MD;  Location: [place];  Service: Thoracic;  Laterality: Left;  Marland Kitchen VIDEO ASSISTED THORACOSCOPY Left 08/06/2016   Procedure: VIDEO ASSISTED THORACOSCOPY, left;  Surgeon: [name], MD;  Location: [place];  Service: Thoracic;  Laterality: Left;  Marland Kitchen VIDEO BRONCHOSCOPY N/A 08/06/2016   Procedure: VIDEO BRONCHOSCOPY;  Surgeon:  [name], MD;  Location: [place];  Service: Thoracic;  Laterality: N/A;     Sherrilyn Rist, MD [place], PCCM Cell: [phone]

## 2018-07-29 ENCOUNTER — Inpatient Hospital Stay (HOSPITAL_COMMUNITY): Payer: Medicare Other

## 2018-07-29 DIAGNOSIS — R509 Fever, unspecified: Secondary | ICD-10-CM

## 2018-07-29 LAB — CBC WITH DIFFERENTIAL/PLATELET
Abs Immature Granulocytes: 0.09 10*3/uL — ABNORMAL HIGH (ref 0.00–0.07)
Basophils Absolute: 0 10*3/uL (ref 0.0–0.1)
Basophils Relative: 0 %
Eosinophils Absolute: 0.2 10*3/uL (ref 0.0–0.5)
Eosinophils Relative: 2 %
HCT: 32.6 % — ABNORMAL LOW (ref 39.0–52.0)
Hemoglobin: 10.4 g/dL — ABNORMAL LOW (ref 13.0–17.0)
Immature Granulocytes: 1 %
Lymphocytes Relative: 7 %
Lymphs Abs: 0.8 10*3/uL (ref 0.7–4.0)
MCH: 32.2 pg (ref 26.0–34.0)
MCHC: 31.9 g/dL (ref 30.0–36.0)
MCV: 100.9 fL — ABNORMAL HIGH (ref 80.0–100.0)
Monocytes Absolute: 1.3 10*3/uL — ABNORMAL HIGH (ref 0.1–1.0)
Monocytes Relative: 12 %
Neutro Abs: 8.5 10*3/uL — ABNORMAL HIGH (ref 1.7–7.7)
Neutrophils Relative %: 78 %
Platelets: 158 10*3/uL (ref 150–400)
RBC: 3.23 MIL/uL — ABNORMAL LOW (ref 4.22–5.81)
RDW: 14.4 % (ref 11.5–15.5)
WBC: 10.9 10*3/uL — ABNORMAL HIGH (ref 4.0–10.5)
nRBC: 0 % (ref 0.0–0.2)

## 2018-07-29 LAB — URINALYSIS, ROUTINE W REFLEX MICROSCOPIC
Bacteria, UA: NONE SEEN
Bilirubin Urine: NEGATIVE
Glucose, UA: NEGATIVE mg/dL
Ketones, ur: NEGATIVE mg/dL
Leukocytes,Ua: NEGATIVE
Nitrite: NEGATIVE
Protein, ur: 100 mg/dL — AB
Specific Gravity, Urine: 1.014 (ref 1.005–1.030)
pH: 6 (ref 5.0–8.0)

## 2018-07-29 LAB — COMPREHENSIVE METABOLIC PANEL
ALT: 15 U/L (ref 0–44)
AST: 17 U/L (ref 15–41)
Albumin: 2.7 g/dL — ABNORMAL LOW (ref 3.5–5.0)
Alkaline Phosphatase: 73 U/L (ref 38–126)
Anion gap: 9 (ref 5–15)
BUN: 17 mg/dL (ref 8–23)
CO2: 23 mmol/L (ref 22–32)
Calcium: 8 mg/dL — ABNORMAL LOW (ref 8.9–10.3)
Chloride: 101 mmol/L (ref 98–111)
Creatinine, Ser: 1.12 mg/dL (ref 0.61–1.24)
GFR calc Af Amer: >60 mL/min (ref >60)
GFR calc non Af Amer: >60 mL/min (ref >60)
Glucose, Bld: 92 mg/dL (ref 70–99)
Potassium: 3.7 mmol/L (ref 3.5–5.1)
Sodium: 133 mmol/L — ABNORMAL LOW (ref 135–145)
Total Bilirubin: 0.4 mg/dL (ref 0.3–1.2)
Total Protein: 6.2 g/dL — ABNORMAL LOW (ref 6.5–8.1)

## 2018-07-29 LAB — MAGNESIUM: Magnesium: 2.1 mg/dL (ref 1.7–2.4)

## 2018-07-29 LAB — PHOSPHORUS: Phosphorus: 2.2 mg/dL — ABNORMAL LOW (ref 2.5–4.6)

## 2018-07-29 MED ORDER — SODIUM PHOSPHATES 45 MMOLE/15ML IV SOLN
20.0000 mmol | Freq: Once | INTRAVENOUS | Status: AC
  Administered 2018-07-29: 20 mmol via INTRAVENOUS
  Filled 2018-07-29: qty 6.67

## 2018-07-29 NOTE — Evaluation (Signed)
Occupational Therapy Evaluation Patient Details Name: Jeremy Chan MRN: [id number] DOB: 09-13-1940 Today's Date: 07/29/2018    History of Present Illness Jeremy Chan is a 78 y.o. male found to have PE and initiated therapeutic heparin. 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, Pan-hypopitutarism. Presented with less of breath worse than his baseline and worse with exertion he got suddenly very short of breath   Clinical Impression   Pt admitted with PE.   Pt currently with functional limitations due to the deficits listed below (see OT Problem List).  Pt will benefit from skilled OT to increase their safety and independence with ADL and functional mobility for ADL to facilitate discharge to venue listed below.   Pt fatigued VERY quickly during OT visit, but was agreeable to OT.  Pt cares for wife, but may need rehab himself prior to going home.       Follow Up Recommendations  SNF;Supervision/Assistance - 24 hour;Home health OT(depending on care at home)    Equipment Recommendations  3 in 1 bedside commode    Recommendations for Other Services       Precautions / Restrictions Precautions Precautions: Fall Precaution Comments: on supplemental O2      Mobility Bed Mobility Overal bed mobility: Needs Assistance Bed Mobility: Supine to Sit     Supine to sit: Min assist     General bed mobility comments: min assist, extra time to mobilize  Transfers Overall transfer level: Needs assistance Equipment used: 1 person hand held assist Transfers: Sit to/from [employer] Sit to Stand: Min assist Stand pivot transfers: Min assist       General transfer comment: Pt took very small steps,. VC needed for safety    Balance Overall balance assessment: Needs assistance Sitting-balance support: No upper extremity supported;Feet supported Sitting balance-Leahy Scale: Good     Standing balance support: During functional activity;Single extremity supported Standing balance-Leahy Scale: Poor Standing balance comment: patient is much weaker than previous eval and requires UE support                           ADL either performed or assessed with clinical judgement   ADL Overall ADL's : Needs assistance/impaired Eating/Feeding: Set up;Sitting   Grooming: Set up;Sitting   Upper Body Bathing: Set up;Sitting   Lower Body Bathing: Moderate assistance;Sit to/from stand;Cueing for safety;Cueing for sequencing   Upper Body Dressing : Minimal assistance   Lower Body Dressing: Moderate assistance;Sit to/from stand;Cueing for compensatory techniques;Cueing for sequencing                 General ADL Comments: Pt fatigued during OT visit. Pt did agree to get OOb. Bed was wet. CNA notifieid, Urinal placed with in reach for pt in the chair     Vision Patient Visual Report: No change from baseline              Pertinent Vitals/Pain Pain Assessment: No/denies pain        Extremity/Trunk Assessment Upper Extremity Assessment Upper Extremity Assessment: Generalized weakness           Communication  Cognition Arousal/Alertness: Awake/alert Behavior During Therapy: WFL for tasks assessed/performed Overall Cognitive Status: Within Functional Limits for tasks assessed                                 General Comments: pt willing to get OOB  but did need some encoruagment.  Lunch had arrived   General Comments               Home Living Family/patient expects to be discharged to:: Private residence Living  Arrangements: Spouse/significant other Available Help at Discharge: Available PRN/intermittently Type of Home: House   Entrance Stairs-Number of Steps: 2 (6" step)   Home Layout: One level     Bathroom Shower/Tub: Walk-in shower             Additional Comments: Pt's spouse has MS and uses a wheelchair and RW at home. He is the primary caregiver of his wife but reports he does not have to help her with mobility. He has a strong support system with his daughter and son. He also has a dog at home.      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        OT Problem List: Decreased strength;Impaired balance (sitting and/or standing);Decreased activity tolerance;Cardiopulmonary status limiting activity      OT Treatment/Interventions: Self-care/ADL training;DME and/or AE instruction;Patient/family education;Therapeutic activities    OT Goals(Current goals can be found in the care plan section) Acute Rehab OT Goals Patient Stated Goal: to go home and be with his wife OT Goal Formulation: With patient Time For Goal Achievement: 08/06/18 Potential to Achieve Goals: Good  OT Frequency: Min 2X/week   Barriers to D/C: Decreased caregiver support             AM-PAC OT "6 Clicks" Daily Activity     Outcome Measure Help from another person eating meals?: None Help from another person taking care of personal grooming?: A Little Help from another person toileting, which includes using toliet, bedpan, or urinal?: A Little Help from another person bathing (including washing, rinsing, drying)?: A Little Help from another person to put on and taking off regular upper body clothing?: A Little Help from another person to put on and taking off regular lower body clothing?: A Lot 6 Click Score: 18   End of Session Equipment Utilized During Treatment: Gait  belt Nurse Communication: Mobility status  Activity Tolerance: Patient limited by fatigue Patient left: in chair;with call bell/phone within reach;with chair alarm set  OT Visit Diagnosis: Unsteadiness on feet (R26.81);Muscle weakness (generalized) (M62.81);Other abnormalities of gait and mobility (R26.89);History of falling (Z91.81)                Time: 9678-9381 OT Time Calculation (min): 22 min Charges:  OT Evaluation $OT Eval Moderate Complexity: 1 Mod  Kari Baars, OT Acute Rehabilitation Services [phone] Office- [phone], Edwena Felty D 07/29/2018, 2:46 PM

## 2018-07-29 NOTE — Plan of Care (Signed)
  Problem: Activity: Goal: Risk for activity intolerance will decrease Outcome: Progressing  Patient working with physical therapy, OOB to chair, and ambulating    Problem: Nutrition: Goal: Adequate nutrition will be maintained Outcome: Progressing Patient with fair appetite, drinking nutritional supplements

## 2018-07-29 NOTE — Evaluation (Signed)
Physical Therapy Evaluation Patient Details Name: Jeremy Chan MRN: [id number] DOB: 1940/12/08 Today's Date: 07/29/2018   History of Present Illness  Jeremy Chan is a 78 y.o. male found to have PE and initiated therapeutic heparin. 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, Pan-hypopitutarism. Presented with less of breath worse than his baseline and worse with exertion he got suddenly very short of breath  Clinical Impression  The patient is much weaker than evaluation 5 days ago. Patient reports feeling weak and does not feel up to ambulating. Patient did mobilize to sitting and stood x 1 with SaO2 >95% on 3 L. Patient will need to be independennt as wife is unable to assist the patient. Other family will ba available at times. Pt admitted with above diagnosis. Pt currently with functional limitations due to the deficits listed below (see PT Problem List).  Pt will benefit from skilled PT to increase their independence and safety with mobility to allow discharge to the venue listed below.       Follow Up Recommendations Home health PT;Supervision for mobility/OOB    Equipment Recommendations  None recommended by PT    Recommendations for Other Services       Precautions / Restrictions Precautions Precaution Comments: on supplemental O2 Restrictions Weight Bearing Restrictions: No      Mobility  Bed Mobility Overal bed mobility: Needs Assistance Bed Mobility: Supine to Sit     Supine to sit: Min assist     General bed mobility comments: min assist, extra time to  mobilize  Transfers Overall transfer level: Needs assistance Equipment used: 1 person hand held assist Transfers: Sit to/from Stand Sit to Stand: Min assist         General transfer comment: stood from bed with assist to rise from bed. side steps along bed requiring assistance to stabilize.  Ambulation/Gait             General Gait Details: NT, pt reports feeling too weak  Stairs            Wheelchair Mobility    Modified Rankin (Stroke Patients Only)       Balance Overall balance assessment: Needs assistance Sitting-balance support: No upper extremity supported;Feet supported Sitting balance-Leahy Scale: Good     Standing balance support: During functional activity;Single extremity supported Standing balance-Leahy Scale: Poor Standing balance comment: patient is much weaker than previous eval and requires UE support                             Pertinent Vitals/Pain Pain Assessment: Faces Faces Pain Scale: Hurts little more Pain Location: back on the right Pain Descriptors / Indicators: Aching Pain Intervention(s): Monitored during session    Home Living Family/patient expects to be discharged to:: Private residence Living Arrangements: Spouse/significant other Available Help at Discharge: Available PRN/intermittently Type of Home: House Home Access: Stairs to enter   [employer] of Steps: 2 (6" step) Home Layout: One level Home Equipment: Walker - 4 wheels Additional Comments: Pt's spouse has MS and uses a wheelchair and RW at home. He is the primary caregiver of his wife but  reports he does not have to help her with mobility. He has a strong support system with his daughter and son. He also has a dog at home.    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     Hand Dominance         Extremity/Trunk Assessment   Upper Extremity Assessment Upper Extremity Assessment: Defer to OT evaluation    Lower Extremity Assessment Lower Extremity Assessment: Generalized weakness    Cervical / Trunk Assessment Cervical / Trunk Assessment: Normal  Communication      Cognition Arousal/Alertness: Awake/alert Behavior During Therapy: WFL for tasks assessed/performed Overall Cognitive Status: Within Functional Limits for tasks assessed                                 General Comments: seems a bit down, speaks of knowing that he may not be around for long, worried about the fever.      General Comments      Exercises     Assessment/Plan    PT Assessment Patient needs continued PT services  PT Problem List Decreased strength;Decreased activity tolerance;Decreased mobility;Decreased knowledge of use of DME;Cardiopulmonary status limiting activity       PT Treatment Interventions DME instruction;Gait training;Functional mobility training;Therapeutic exercise;Therapeutic activities;Stair training;Patient/family education    PT Goals (Current goals can be found in the Care Plan section)  Acute Rehab PT Goals Patient Stated Goal: to go home and be with his wife PT Goal Formulation: With patient Time For Goal Achievement: 08/12/18 Potential to Achieve Goals: Fair    Frequency Min 3X/week   Barriers to discharge        Co-evaluation               AM-PAC PT "6 Clicks" Mobility  Outcome Measure Help needed turning from your back to your side while in a flat bed without using bedrails?: A Little Help needed moving from lying on your back to sitting on the side of a flat bed without using bedrails?: A Little Help needed moving to and from a bed to a chair (including a wheelchair)?: A Lot Help needed standing up from a chair using your arms (e.g., wheelchair or bedside chair)?: A Lot Help needed to walk in hospital room?: A Lot Help needed  climbing 3-5 steps with a railing? : A Lot 6 Click Score: 14    End of Session Equipment Utilized During Treatment: Oxygen Activity Tolerance: Patient limited by fatigue Patient left: in bed;with call bell/phone within reach Nurse Communication: Mobility status PT Visit Diagnosis: Difficulty in walking, not elsewhere classified (R26.2)    Time: 0277-4128 PT Time Calculation (min) (ACUTE ONLY): 41 min   Charges:   PT Evaluation $PT Re-evaluation: 1 Re-eval PT Treatments $Therapeutic Activity: 23-37 mins        [place] Ameirah Khatoon Pager [phone] Office [phone]   Claretha Cooper 07/29/2018, 1:26 PM

## 2018-07-29 NOTE — Progress Notes (Signed)
PROGRESS NOTE    Jeremy Chan  FYB:[id number] DOB: Jun 26, 1940 DOA: 07/23/2018 PCP: Haze Rushing, MD   Brief Narrative:  HPI per [name] on 07/23/2018 Jeremy Chan is a 78 y.o. 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, Pan-hypopitutarism    Presented with less of breath worse than his baseline and worse with exertion he got suddenly very short of breath today when he was trying to clean off the urine on the floor when his dog had an accident.  When EMS arrived his oxygen saturation was down to 92% he was placed on nonrebreather secondary to increased work of breathing although by that time his oxygen saturation was 95% room air He has had decreased appetite and fatigue which is been chronic since his diagnosis of cancer having had significant coughing no fevers no chest pain he denies any leg swelling   Recently seen by his oncologist CT 07/16/18 showed enlargement of a right renal neoplasm w/d interval extension into the R renal vein and IVC, enlargement of multiple necrotic mediastinal lymph node conglomerates, and increased nodularity chest and abdomen. Stable multiple sclerotic and lytic foci of osseous metastatic dise.  Plan is to hold immunotherapy w/ switch to cabozantinib   recent CT chest/abd/pelvis from 07/16/2018  Showed  progression of disease including enlargement of a right renal neoplasm with interval extension into the right renal vein and inferior vena cava, enlargement of multiple necrotic mediastinal lymph node conglomerates, and enlarging nodularity involving  the chest and abdomen   **Interim History Patient remains on a heparin drip and case was discussed with In house oncology who recommends full dose anticoagulation with Lovenox at discharge I spoke with the patient's primary oncologist who recommends either way starting him on a anticoagulant and is okay with doing a NOAC.Marland Kitchen  Echocardiogram was done and not show any right heart strain and no evidence of any right systolic dysfunction and lower extremity duplex was done and was negative for VTE.  After discussion with his primary oncologist [name], he recommends obtaining an MRI and continue heparin drip for now before transitioning to rule out any metastatic lesions with hemorrhagic transformation in the brain.  MRI done and was within limits for detection on a noncontrast examination but showed no intracranial metastatic disease.  Atrophy with chronic microvascular ischemic change was seen and slight tonsillar ectopia but there is no intracranial mass lesion or acute stroke noted as well.  Overnight the patient became hypotensive was given a fluid bolus and was then started back on IV fluids.  Pulmonary was consulted for his hemoptysis and pulmonary embolus and because of the submassive size [name] recommending continuing heparin drip and transitioning to NOAC.  [name] evaluated for his hemoptysis and likely thought it may be secondary to PE for anticoagulation and was minimal or possibly from bronchitis and recommends continuing the anticoagulation and transitioning to po Eliquis.    Currently the patient is not a candidate for thrombolytics.  His oxygen requirements are weaning but patient still feels anxious about not having oxygen but he did desaturate without O2 and does require at least 1 Liter of Supplemental O2 via [place] as  evidenced by his Ambulatory Home O2 Screen. Fluids are now discontinued. Antibiotics were started with p.o. doxycycline given him questionable pneumonia versus bronchitis  as WBCs are elevating and procalcitonin is also going up slightly.  Chest x-ray did show slight progression of left lung base opacity earlier which may be due to increasing pleural effusion/electrophysical airspace disease/pneumonia and there is increase in right infrahilar opacity favoring atelectasis and there is concern for aspiration however speech therapy evaluated and currently recommending regular diet with thin liquids.  Overnight the patient did spike a small temperature and we will panculture and follow.  Likely temperatures in the setting of his PE but if he continues to have fevers will need to broaden antibiotic coverage.  Anticipating discharging home with home health and supplemental oxygen in a.m. if he is stable and has had no fevers overnight  Assessment & Plan:   Principal Problem:   Pulmonary embolism (HCC) Active Problems:   Hyperlipidemia   Essential hypertension   Renal cell cancer, right (HCC)   CAD (coronary artery disease)   Dehydration   AKI (acute kidney injury) ([place])   Hypothyroidism   Hypopituitarism ([place])  Acute Pulmonary Embolism and likely submassive -Setting of hypercoagulable state secondary to his malignancies -Admitted to inpatient telemetry and will continue telemetry monitoring -Cycle cardiac troponins were less than 0.034 -CTA of the chest showed positive acute PE essentially within the main right pulmonary artery with extension to the right middle lobe and right lower lobe pulmonary arteries.  There is CT evidence of right heart strain with RV/LV ratio of 1.3 consistent with a recent submassive PE -Started on heparin drip and will continue for now -Discussed with medical oncology [name] who recommends transitioning to Lovenox at discharge 1 mg/kg twice daily at discharge but will continue heparin drip for at least 48 hours currently; after further discussion and discussion with his primary oncologist [name] at [facility], [name]  recommends starting the patient on a NOAC at D/C as he does not have a Preference but recommends obtaining an MRI of the brain given patient's worsened metastatic disease -Continue to hold home blood pressure medications and avoid hypotension -Echocardiogram done and showed a normal systolic function with a EF of 55 to 60% with cavity size that was normal.  There is no increase in left ventricular wall thickness and left ventricular diastolic parameters were consistent with impaired relaxation the right ventricle has a normal systolic function -No evidence of right heart strain or systolic dysfunction on echocardiogram -Lower extremity Dopplers were negative for VTE -Have consulted case management for cost of full dose Lovenox 1 milligram per kilogram and this is on patient's formulary and after discussion with patient's primary oncologist he recommends starting a NOAC. Apixaban started and will need a free 30-day card.  Per case management does not have prescription coverage -Obtained MRI to evaluate the brain for any metastatic lesions given concern for any hemorrhagic transformation; -MRI was negative as above and pulmonary evaluated and recommended continuing anticoagulation and have now transitioned to p.o. apixaban -Patient had 2 small sputum's that were bloody and this is likely partial clot and may be related to anticoagulation but pulmonary evaluated recommending continue anticoagulation at this time -We will need home ambulatory screen prior to discharge and this was done yesterday patient did desaturate to 85% on room air so we will place the patient on at least 2 L of supplemental oxygen via nasal cannula at home.  Acute Respiratory Failure with hypoxia  secondary to above -Patient was placed on 2 L of supplemental oxygen via nasal cannula has now been weaned to 1 L but back up to 2 Liters as O2 saturation was 87% on RA while he was talking and 85% on RA Ambulating  -Continue pulse oximetry  and maintain O2 saturation greater than 92% -Continue with supplemental oxygen via nasal cannula and wean O2 as tolerated -Repeat CXR this AM showed "Stable cardiomegaly. No pneumothorax is noted. Stable elevated left hemidiaphragm is noted. Mild left basilar atelectasis or infiltrate is noted. Mild right basilar atelectasis or infiltrate is noted with small right pleural effusion. Bony thorax is unremarkable" -Ater my discussion with pulmonary will empirically start the patient on antibiotics with doxycycline for possible bronchitis and or pneumonia -In the setting of PE -We will need home Ambulatory screen prior to discharge as above   Bronchitis versus Pneumonia -Patient WBC is elevating and went from 13.6 -> 17.1 -> 13.8 -> 10.9 after Abx Started  -Procalcitonin level was 0.50 and is now worsened to 1.09 and is now 1.03 -After discussion with pulmonary will start the patient on doxycycline -We will continue to monitor patient's CBC and procalcitonin level in the a.m. -Patient will need a walk screen prior to discharge and this is as above  -We will add breathing treatments as necessary place the patient on albuterol Neb every 2 as needed -IVF stopped today  -CXR as above -Continue Doxycycline currently; Patient did spike a TMax of 100.4 overnight and will need to continue to Monitor Temperature Curve Cx's -Rpeat CXR in AM   Fever -Mild at 100.4 in the setting of PE but ? Related to infection -Pan Cx and Obtain Blood Cx x2, UA, Urine Cx -Urine looked Unremarkable  -Repeat CXR in AM -C/w Doxycycline po  -Repeat COVID Testing was Negative 2 days ago -Continue Doxycycline currently; Patient did spike a TMax of 100.4 overnight and will need to continue to Monitor Temperature Curve Cx's -Continue to Monitor Carefully   Hyperlipidemia  -Stable continue home medications with Atorvastatin 40 g p.o. nightly  Essential Hypertension blood currently now hypotensive -Continue hold home  medications with Amlodipine-Benazepril Combination given PE and AKI for now  -IVF with NS a 50 mL/hr now held.   Metastatic Renal cell cancer, Right Hx of Prostate Cancer -[facility] -Renal cancer has metastasized to the lung, the bone, the pelvis as well as soft tissue; patient is currently on nivolumab -Also had a history of laryngeal cancer that involved bilateral vocal cord status post radiation and history of prostate cancers treated with brachytherapy implantations over 2000 -Currently have change in immunotherapy given failure of a previous treatment and progression have not started new therapy yet -CTA showed worsening metastatic disease as evidenced by multiple pathologically enlarged mediastinal lymph nodes in addition to enlarging sub-pleural pulmonary nodules on the left and metastatic deposit that appears to be centered within or adjacent to the left hemidiaphragm -I spoke with [name] his primary oncologist who is planning on starting a new immunotherapy at discharge but [name] was concerned about possible metastatic lesions to the brain given his worsening metastatic disease acutely so he recommends obtaining an MRI of the brain without contrast for now to make sure the patient does not have any metastatic lesions to the brain with possible bleeding as he is on anticoagulation -MRI was done and within limits for detection on a noncontrast examination but showed no intracranial metastatic disease.  Atrophy with chronic microvascular  ischemic change was seen and slight tonsillar ectopia but there is no intracranial mass lesion or acute stroke noted as well.   CAD  with history of stents in the early 2000 -Stable continue home medications with ASA 81 mg po Daily and Atorvastatin 40 mg po Daily   Dehydration  -IV fluid hydration is now stopped but now will resume as the patient received a bolus overnight due to his hypotension -BUN/Cr was improving slightly  and went from 24/1.39 -> 17/1.12 -Improving; IVF now stopped   AKI (acute kidney injury) ([place]) most likely secondary to dehydration -IVF to stop now that patient has some pedal edema and is a little puffy -BUN/Cr went from 24/1.39 -> 17/1.12 -Continue to Monitor and follow Renal Fxn -Avoid Nephrotoxic Medications if possible   Pan-Hypopituitarism Hypothyroidism   -Continue Home Prednisone and Synthroid -Patient sees Dr. Herschel Senegal in endocrinology and also receives testosterone every 2 weeks  Macrocytic Anemia -Patient's Hb/Hct went from 12.2/38.1 -> 10.4/32.6 -Checked Anemia Panel and showed an iron level of 24, U IBC of 188, TIBC 212, saturation ratios of 11%, ferritin level 1026, folate level 4.6, and vitamin B12 level 222 -Continue to Monitor for S/Sx of Bleeding as patient is anticoagulated with Heparin gtt; Currently no overt signs of bleeding -Repeat CBC in AM   Thrombocytopenia -Patient's platelet count is now 158,000 -Continue to monitor for signs and symptoms of bleeding; currently no overt bleeding noted -Continue monitor carefully as patient is on a heparin drip -Repeat CBC in a.m.  Leukocytosis -Likely reactive in the setting of PE but ? Bronchitis vs. PNA -WBC trending down again and is now 10.9  -Started patient on po Doxycycline and will continue  -Continue monitor for signs and symptoms of infection -Repeat CBC in a.m.  Hyponatremia -Patient's sodium level was 130 yesterday and is now 133; In the setting of poor po Intake as he is not eating much -IVF Hydration with NS at 50 mL/hr now stopped -Replete with IV Sodium Phos 20 mmol -Repeat CMp in AM   Hypophosphatemia -Patient's Phos Level this AM was 2.2 -Repelte with IV Sodium Phos 20 mmol -Continue to Monitor and Replete as Necessary -Repeat Phos Level in AM   Metabolic Acidosis -Patient CO2 was 20 and anion gap was 12; now CO2 is 23 and AG is 9 -Continue with IV fluid hydration as above -Continue  monitor and trend and repeat CMP in a.m.  Poor Po Intake/FTT -Nutritionist Consulted -Will get PT/OT to evaluate and Treat -C/w Gentle IVF with NS at 50 mL/hr though today  -Repeat CXR in AM   DVT prophylaxis: Anticoagulated with Eliquis   Code Status: FULL CODE  Family Communication: No family present at bedside but I spoke with the son over the phone Disposition Plan: Remain Inpatient for continued Workup and treatment and Anticipate D/C in the next 24- hours if he is afebrile    Consultants:   None but case was discussed with Medical Oncology [name]  Case was also discussed with [facility] oncology [name]  Pulmonary [name]; Signed off    Procedures:  ECHOCARDIOGRAM 07/23/2018 IMPRESSIONS    1. The left ventricle has normal systolic function, with an ejection fraction of 55-60%. The cavity size was normal. Left ventricular diastolic Doppler parameters are consistent with impaired relaxation.  2. The right ventricle has normal systolic function. The cavity was normal. There is no increase in right ventricular wall thickness.  FINDINGS  Left Ventricle: The left ventricle has  normal systolic function, with an ejection fraction of 55-60%. The cavity size was normal. There is no increase in left ventricular wall thickness. Left ventricular diastolic Doppler parameters are consistent with  impaired relaxation.  Right Ventricle: The right ventricle has normal systolic function. The cavity was normal. There is no increase in right ventricular wall thickness.  Left Atrium: Left atrial size was normal in size.  Right Atrium: Right atrial size was normal in size. Right atrial pressure is estimated at 10 mmHg.  Interatrial Septum: No atrial level shunt detected by color flow Doppler.  Pericardium: There is no evidence of pericardial effusion.  Mitral Valve: The mitral valve is normal in structure. Mitral valve regurgitation is not visualized by color flow  Doppler.  Tricuspid Valve: The tricuspid valve is normal in structure. Tricuspid valve regurgitation was not visualized by color flow Doppler.  Aortic Valve: The aortic valve is normal in structure. Aortic valve regurgitation was not visualized by color flow Doppler.  Pulmonic Valve: The pulmonic valve was normal in structure. Pulmonic valve regurgitation is not visualized by color flow Doppler.  Venous: The inferior vena cava is normal in size with greater than 50% respiratory variability.    +--------------+--------++  LEFT VENTRICLE            +----------------+---------++ +--------------+--------++  Diastology                    PLAX 2D                   +----------------+---------++ +--------------+--------++  LV e' lateral:   9.63 cm/s    LVIDd:         5.15 cm    +----------------+---------++ +--------------+--------++  LV E/e' lateral: 6.4          LVIDs:         3.04 cm    +----------------+---------++ +--------------+--------++  LV e' medial:    7.94 cm/s    LV PW:         1.04 cm    +----------------+---------++ +--------------+--------++  LV E/e' medial:  7.8          LV IVS:        0.75 cm    +----------------+---------++ +--------------+--------++  LVOT diam:     2.00 cm    +--------------+--------++  LV SV:         90 ml      +--------------+--------++  LV SV Index:   43.06      +--------------+--------++  LVOT Area:     3.14 cm   +--------------+--------++                            +--------------+--------++  +---------------+----------++  RIGHT VENTRICLE              +---------------+----------++  RV S prime:     11.20 cm/s   +---------------+----------++  +---------------+-------++-----------++  LEFT ATRIUM              Index         +---------------+-------++-----------++  LA diam:        3.50 cm  1.70 cm/m    +---------------+-------++-----------++  LA Vol (A2C):   29.3 ml  14.26 ml/m   +---------------+-------++-----------++  LA Vol  (A4C):   42.4 ml  20.64 ml/m   +---------------+-------++-----------++  LA Biplane Vol: 36.4 ml  17.72 ml/m   +---------------+-------++-----------++  +------------+-----------++  AORTIC VALVE               +------------+-----------++  LVOT Vmax:   70.90 cm/s    +------------+-----------++  LVOT Vmean:  46.900 cm/s   +------------+-----------++  LVOT VTI:    0.157 m       +------------+-----------++   +-------------+-------++  AORTA                   +-------------+-------++  Ao Root diam: 3.10 cm   +-------------+-------++  +--------------+----------++  MITRAL VALVE                +--------------+-------+ +--------------+----------++  SHUNTS                   MV Area (PHT): 2.73 cm     +--------------+-------+ +--------------+----------++  Systemic VTI:  0.16 m    MV PHT:        80.62 msec   +--------------+-------+ +--------------+----------++  Systemic Diam: 2.00 cm   MV Decel Time: 278 msec     +--------------+-------+ +--------------+----------++ +--------------+----------++  MV E velocity: 61.60 cm/s   +--------------+----------++  MV A velocity: 75.70 cm/s   +--------------+----------++  MV E/A ratio:  0.81         +--------------+----------++   Antimicrobials:  Anti-infectives (From admission, onward)   Start     Dose/Rate Route Frequency Ordered Stop   07/27/18 1100  doxycycline (VIBRA-TABS) tablet 100 mg     100 mg Oral Every 12 hours 07/27/18 1046 08/06/18 0959   07/27/18 1000  ceFEPIme (MAXIPIME) 1 g in sodium chloride 0.9 % 100 mL IVPB  Status:  Discontinued     1 g 200 mL/hr over 30 Minutes Intravenous Every 12 hours 07/27/18 0910 07/27/18 1046     Subjective: Seen and examined at bedside   Objective: Vitals:   07/28/18 2150 07/28/18 2245 07/29/18 0343 07/29/18 1405  BP: 131/73  124/65 122/66  Pulse: 91  85 94  Resp: 20  20 20   Temp: (!) 100.4 F (38 C) 99.4 F (37.4 C) 98.9 F (37.2 C) 97.7 F (36.5 C)  TempSrc: Oral Oral Oral Oral    SpO2: 97%  96% 98%  Weight:      Height:        Intake/Output Summary (Last 24 hours) at 07/29/2018 1542 Last data filed at 07/29/2018 1420 Gross per 24 hour  Intake 1397 ml  Output 1300 ml  Net 97 ml   Filed Weights   07/23/18 1824  Weight: 88.5 kg   Examination: Physical Exam:  Constitutional: Chronically ill-appearing Caucasian male who was overweight and currently in no acute distress but does appear uncomfortable wearing supplemental oxygen via nasal cannula  Eyes: Lids and conjunctive are normal.  Sclera anicteric ENMT: External ears nose appear normal.  Grossly normal hearing Neck: Appears supple no JVD Respiratory: Diminished auscultation bilaterally no appreciable wheezing, rales or rhonchi.  Patient not tachypneic but is wearing supplemental oxygen via nasal cannula Cardiovascular: Regular rate and rhythm.  Has a 2/6 systolic murmur.  Has some lower extremity edema and pedal edema noted Abdomen: Soft, nontender, distended secondary body habitus.  Bowel sounds present GU: Deferred Musculoskeletal: No contractures or cyanosis.  No joint fomites in upper lower extremities Skin: Skin is warm and dry no appreciable rashes or lesions on to skin evaluation Neurologic: Cranial nerves II through XII grossly intact no appreciable focal deficits.  Romberg sign is cerebellar reflexes were not assessed Psychiatric: Patient is awake, alert, oriented but he is very anxious.  Has a normal judgment and insight  Data Reviewed: I have personally reviewed following labs and  imaging studies  CBC: Recent Labs  Lab 07/25/18 0446 07/26/18 0525 07/27/18 0503 07/28/18 0451 07/29/18 0619  WBC 13.6* 16.4* 17.1* 13.8* 10.9*  NEUTROABS 10.2* 12.8* 13.7* 11.2* 8.5*  HGB 11.8* 11.1* 10.2* 9.8* 10.4*  HCT 38.0* 34.8* 32.4* 30.5* 32.6*  MCV 102.4* 101.2* 102.5* 101.0* 100.9*  PLT 135* 145* 148* 152 326   Basic Metabolic Panel: Recent Labs  Lab 07/25/18 0446 07/26/18 0525 07/27/18 0503  07/28/18 0451 07/29/18 0619  NA 135 134* 130* 132* 133*  K 4.5 4.0 4.6 4.0 3.7  CL 102 102 99 101 101  CO2 22 20* 22 21* 23  GLUCOSE 93 107* 102* 104* 92  BUN 20 19 21 20 17   CREATININE 1.11 1.27* 1.22 1.17 1.12  CALCIUM 8.5* 8.5* 7.8* 7.9* 8.0*  MG 2.2 2.0 2.1 2.0 2.1  PHOS 2.5 2.5 2.4* 2.2* 2.2*   GFR: Estimated Creatinine Clearance: 59.4 mL/min (by C-G formula based on SCr of 1.12 mg/dL). Liver Function Tests: Recent Labs  Lab 07/25/18 0446 07/26/18 0525 07/27/18 0503 07/28/18 0451 07/29/18 0619  AST 15 12* 10* 11* 17  ALT 9 11 8 10 15   ALKPHOS 60 58 53 59 73  BILITOT 0.8 1.1 0.9 0.3 0.4  PROT 6.6 6.7 6.1* 6.0* 6.2*  ALBUMIN 3.6 3.3* 3.0* 2.8* 2.7*   No results for input(s): LIPASE, AMYLASE in the last 168 hours. No results for input(s): AMMONIA in the last 168 hours. Coagulation Profile: Recent Labs  Lab 07/23/18 1857  INR 1.0   Cardiac Enzymes: Recent Labs  Lab 07/23/18 1857 07/23/18 2151 07/24/18 0358 07/24/18 0949  TROPONINI <0.03 <0.03 <0.03 <0.03   BNP (last 3 results) No results for input(s): PROBNP in the last 8760 hours. HbA1C: No results for input(s): HGBA1C in the last 72 hours. CBG: No results for input(s): GLUCAP in the last 168 hours. Lipid Profile: No results for input(s): CHOL, HDL, LDLCALC, TRIG, CHOLHDL, LDLDIRECT in the last 72 hours. Thyroid Function Tests: No results for input(s): TSH, T4TOTAL, FREET4, T3FREE, THYROIDAB in the last 72 hours. Anemia Panel: No results for input(s): VITAMINB12, FOLATE, FERRITIN, TIBC, IRON, RETICCTPCT in the last 72 hours. Sepsis Labs: Recent Labs  Lab 07/26/18 0926 07/27/18 0503 07/28/18 0451  PROCALCITON 0.50 1.09 1.03    Recent Results (from the past 240 hour(s))  SARS Coronavirus 2 (CEPHEID - Performed in [place] hospital lab), Hosp Order     Status: None   Collection Time: 07/23/18  6:57 PM   Specimen: Nasopharyngeal Swab  Result Value Ref Range Status   SARS Coronavirus 2  NEGATIVE NEGATIVE Final    Comment: (NOTE) If result is NEGATIVE SARS-CoV-2 target nucleic acids are NOT DETECTED. The SARS-CoV-2 RNA is generally detectable in upper and lower  respiratory specimens during the acute phase of infection. The lowest  concentration of SARS-CoV-2 viral copies this assay can detect is 250  copies / mL. A negative result does not preclude SARS-CoV-2 infection  and should not be used as the sole basis for treatment or other  patient management decisions.  A negative result may occur with  improper specimen collection / handling, submission of specimen other  than nasopharyngeal swab, presence of viral mutation(s) within the  areas targeted by this assay, and inadequate number of viral copies  (<250 copies / mL). A negative result must be combined with clinical  observations, patient history, and epidemiological information. If result is POSITIVE SARS-CoV-2 target nucleic acids are DETECTED. The SARS-CoV-2 RNA is generally detectable  in upper and lower  respiratory specimens dur ing the acute phase of infection.  Positive  results are indicative of active infection with SARS-CoV-2.  Clinical  correlation with patient history and other diagnostic information is  necessary to determine patient infection status.  Positive results do  not rule out bacterial infection or co-infection with other viruses. If result is PRESUMPTIVE POSTIVE SARS-CoV-2 nucleic acids MAY BE PRESENT.   A presumptive positive result was obtained on the submitted specimen  and confirmed on repeat testing.  While 2019 novel coronavirus  (SARS-CoV-2) nucleic acids may be present in the submitted sample  additional confirmatory testing may be necessary for epidemiological  and / or clinical management purposes  to differentiate between  SARS-CoV-2 and other Sarbecovirus currently known to infect humans.  If clinically indicated additional testing with an alternate test  methodology [phone])  is advised. The SARS-CoV-2 RNA is generally  detectable in upper and lower respiratory sp ecimens during the acute  phase of infection. The expected result is Negative. Fact Sheet for Patients:  StrictlyIdeas.no Fact Sheet for Healthcare Providers: BankingDealers.co.za This test is not yet approved or cleared by the [place] FDA and has been authorized for detection and/or diagnosis of SARS-CoV-2 by FDA under an Emergency Use Authorization (EUA).  This EUA will remain in effect (meaning this test can be used) for the duration of the COVID-19 declaration under Section 564(b)(1) of the Act, 21 U.S.C. section 360bbb-3(b)(1), unless the authorization is terminated or revoked sooner. Performed at [facility], [place] [address] [place], [place] [address]   MRSA PCR Screening     Status: None   Collection Time: 07/27/18  8:43 AM   Specimen: Nasopharyngeal  Result Value Ref Range Status   MRSA by PCR NEGATIVE NEGATIVE Final    Comment:        The GeneXpert MRSA Assay (FDA approved for NASAL specimens only), is one component of a comprehensive MRSA colonization surveillance program. It is not intended to diagnose MRSA infection nor to guide or monitor treatment for MRSA infections. Performed at [facility], [place] [address], [place], [place] [address]   Expectorated sputum assessment w rflx to resp cult     Status: None   Collection Time: 07/27/18  8:44 AM   Specimen: Expectorated Sputum  Result Value Ref Range Status   Specimen Description EXPECTORATED SPUTUM  Final   Special Requests NONE  Final   Sputum evaluation   Final    THIS SPECIMEN IS ACCEPTABLE FOR SPUTUM CULTURE Performed at [facility], [place] [address], [place], [place] [address]    Report Status 07/27/2018 FINAL  Final  Culture, respiratory     Status: None (Preliminary result)   Collection Time: 07/27/18  8:44  AM  Result Value Ref Range Status   Specimen Description   Final    EXPECTORATED SPUTUM Performed at [place] [address], [place], [place] [address]    Special Requests   Final    NONE Reflexed from [phone] Performed at [place] [address] [place], [place] [address]    Gram Stain   Final    ABUNDANT SQUAMOUS EPITHELIAL CELLS PRESENT ABUNDANT WBC PRESENT, PREDOMINANTLY PMN ABUNDANT GRAM POSITIVE COCCI ABUNDANT GRAM NEGATIVE RODS MODERATE GRAM POSITIVE RODS    Culture   Final    CULTURE REINCUBATED FOR BETTER GROWTH Performed at [facility], [place] [address], [place], [place] [address]  Report Status PENDING  Incomplete  SARS Coronavirus 2 (CEPHEID - Performed in [place] hospital lab), Hosp Order     Status: None   Collection Time: 07/27/18  8:52 AM   Specimen: Nasopharyngeal Swab  Result Value Ref Range Status   SARS Coronavirus 2 NEGATIVE NEGATIVE Final    Comment: (NOTE) If result is NEGATIVE SARS-CoV-2 target nucleic acids are NOT DETECTED. The SARS-CoV-2 RNA is generally detectable in upper and lower  respiratory specimens during the acute phase of infection. The lowest  concentration of SARS-CoV-2 viral copies this assay can detect is 250  copies / mL. A negative result does not preclude SARS-CoV-2 infection  and should not be used as the sole basis for treatment or other  patient management decisions.  A negative result may occur with  improper specimen collection / handling, submission of specimen other  than nasopharyngeal swab, presence of viral mutation(s) within the  areas targeted by this assay, and inadequate number of viral copies  (<250 copies / mL). A negative result must be combined with clinical  observations, patient history, and epidemiological information. If result is POSITIVE SARS-CoV-2 target nucleic acids are DETECTED. The SARS-CoV-2 RNA is generally detectable in upper and lower    respiratory specimens dur ing the acute phase of infection.  Positive  results are indicative of active infection with SARS-CoV-2.  Clinical  correlation with patient history and other diagnostic information is  necessary to determine patient infection status.  Positive results do  not rule out bacterial infection or co-infection with other viruses. If result is PRESUMPTIVE POSTIVE SARS-CoV-2 nucleic acids MAY BE PRESENT.   A presumptive positive result was obtained on the submitted specimen  and confirmed on repeat testing.  While 2019 novel coronavirus  (SARS-CoV-2) nucleic acids may be present in the submitted sample  additional confirmatory testing may be necessary for epidemiological  and / or clinical management purposes  to differentiate between  SARS-CoV-2 and other Sarbecovirus currently known to infect humans.  If clinically indicated additional testing with an alternate test  methodology [phone]) is advised. The SARS-CoV-2 RNA is generally  detectable in upper and lower respiratory sp ecimens during the acute  phase of infection. The expected result is Negative. Fact Sheet for Patients:  StrictlyIdeas.no Fact Sheet for Healthcare Providers: BankingDealers.co.za This test is not yet approved or cleared by the [place] FDA and has been authorized for detection and/or diagnosis of SARS-CoV-2 by FDA under an Emergency Use Authorization (EUA).  This EUA will remain in effect (meaning this test can be used) for the duration of the COVID-19 declaration under Section 564(b)(1) of the Act, 21 U.S.C. section 360bbb-3(b)(1), unless the authorization is terminated or revoked sooner. Performed at [facility], [place] [address] [place], [place] [address]    Interventions: Ensure Enlive (each supplement provides 350kcal and 20 grams of protein) Radiology Studies: Dg Chest Port 1 View  Result Date:  07/29/2018 CLINICAL DATA:  Shortness of breath. EXAM: PORTABLE CHEST 1 VIEW COMPARISON:  Radiograph of July 28, 2018. FINDINGS: Stable cardiomegaly. No pneumothorax is noted. Stable elevated left hemidiaphragm is noted. Mild left basilar atelectasis or infiltrate is noted. Mild right basilar atelectasis or infiltrate is noted with small right pleural effusion. Bony thorax is unremarkable. IMPRESSION: Stable elevated left hemidiaphragm with mild left basilar subsegmental atelectasis or infiltrate. Mild right basilar atelectasis or infiltrate is noted with small right pleural effusion. Electronically Signed   By: Marijo Conception M.D.   On: 07/29/2018  08:42   Dg Chest Port 1 View  Result Date: 07/28/2018 CLINICAL DATA:  Shortness of breath. EXAM: PORTABLE CHEST 1 VIEW COMPARISON:  Radiograph of July 26, 2018. FINDINGS: Stable cardiomegaly. No pneumothorax is noted. Elevated left hemidiaphragm is noted with stable left basilar atelectasis or infiltrate. Minimal right pleural effusion is noted with associated atelectasis or infiltrate. Bony thorax is unremarkable. IMPRESSION: Stable bilateral lung opacities as described above. Electronically Signed   By: Marijo Conception M.D.   On: 07/28/2018 07:42   Scheduled Meds:  apixaban  10 mg Oral BID   [START ON 08/03/2018] apixaban  5 mg Oral BID   aspirin EC  81 mg Oral Daily   atorvastatin  40 mg Oral Daily   doxycycline  100 mg Oral Q12H   feeding supplement (ENSURE ENLIVE)  237 mL Oral BID BM   levothyroxine  75 mcg Oral QAC breakfast   multivitamin with minerals  1 tablet Oral Daily   predniSONE  5 mg Oral Q breakfast   Continuous Infusions:  sodium chloride 50 mL/hr at 07/29/18 0600    LOS: 5 days   Kerney Elbe, DO [facility] PAGER is on AMION  If 7PM-7AM, please contact night-coverage [email or web address] Password [facility] 07/29/2018, 3:42 PM

## 2018-07-29 NOTE — TOC Transition Note (Signed)
Transition of Care [facility]) - CM/SW Discharge Note   Patient Details  Name: Jeremy Chan MRN: [id number] Date of Birth: [birth year]  Transition of [facility]) CM/SW Contact:  Dessa Phi, RN Phone Number: 07/29/2018, 3:22 PM   Clinical Narrative: Patient informed that OT recc SNF-patient declines SNF.Patient chose [place] aware of HHPT/OT.Ordered for home 02-qualifies for home 02-[employer] rep Zach aware of order, will deliver to rm prior d/c. Patient has own transp home.      Final next level of care: [place] Barriers to Discharge: No Barriers Identified   Patient Goals and CMS Choice Patient states their goals for this hospitalization and ongoing recovery are:: go home CMS Medicare.gov Compare Post Acute Care list provided to:: Patient Choice offered to / list presented to : Patient  Discharge Placement                       Discharge Plan and Services                DME Arranged: Oxygen DME Agency: [employer] Date DME Agency Contacted: 07/29/18 Time DME Agency Contacted: 1522 Representative spoke with at DME Agency: [place]: PT, OT [place] Agency: [facility] [employer] Date [place]: 07/29/18 Time [place]: 1522 Representative spoke with at [place]: [place] ([place]) Interventions     Readmission Risk Interventions No flowsheet data found.

## 2018-07-30 ENCOUNTER — Inpatient Hospital Stay (HOSPITAL_COMMUNITY): Payer: Medicare Other

## 2018-07-30 LAB — CBC WITH DIFFERENTIAL/PLATELET
Abs Immature Granulocytes: 0.13 10*3/uL — ABNORMAL HIGH (ref 0.00–0.07)
Basophils Absolute: 0 10*3/uL (ref 0.0–0.1)
Basophils Relative: 0 %
Eosinophils Absolute: 0.2 10*3/uL (ref 0.0–0.5)
Eosinophils Relative: 2 %
HCT: 33.5 % — ABNORMAL LOW (ref 39.0–52.0)
Hemoglobin: 10.6 g/dL — ABNORMAL LOW (ref 13.0–17.0)
Immature Granulocytes: 1 %
Lymphocytes Relative: 7 %
Lymphs Abs: 0.8 10*3/uL (ref 0.7–4.0)
MCH: 31.8 pg (ref 26.0–34.0)
MCHC: 31.6 g/dL (ref 30.0–36.0)
MCV: 100.6 fL — ABNORMAL HIGH (ref 80.0–100.0)
Monocytes Absolute: 1.5 10*3/uL — ABNORMAL HIGH (ref 0.1–1.0)
Monocytes Relative: 13 %
Neutro Abs: 9 10*3/uL — ABNORMAL HIGH (ref 1.7–7.7)
Neutrophils Relative %: 77 %
Platelets: 188 10*3/uL (ref 150–400)
RBC: 3.33 MIL/uL — ABNORMAL LOW (ref 4.22–5.81)
RDW: 14.4 % (ref 11.5–15.5)
WBC: 11.6 10*3/uL — ABNORMAL HIGH (ref 4.0–10.5)
nRBC: 0 % (ref 0.0–0.2)

## 2018-07-30 LAB — COMPREHENSIVE METABOLIC PANEL
ALT: 24 U/L (ref 0–44)
AST: 28 U/L (ref 15–41)
Albumin: 2.7 g/dL — ABNORMAL LOW (ref 3.5–5.0)
Alkaline Phosphatase: 100 U/L (ref 38–126)
Anion gap: 9 (ref 5–15)
BUN: 16 mg/dL (ref 8–23)
CO2: 24 mmol/L (ref 22–32)
Calcium: 8.5 mg/dL — ABNORMAL LOW (ref 8.9–10.3)
Chloride: 100 mmol/L (ref 98–111)
Creatinine, Ser: 1.07 mg/dL (ref 0.61–1.24)
GFR calc Af Amer: >60 mL/min (ref >60)
GFR calc non Af Amer: >60 mL/min (ref >60)
Glucose, Bld: 100 mg/dL — ABNORMAL HIGH (ref 70–99)
Potassium: 3.8 mmol/L (ref 3.5–5.1)
Sodium: 133 mmol/L — ABNORMAL LOW (ref 135–145)
Total Bilirubin: 0.4 mg/dL (ref 0.3–1.2)
Total Protein: 6.5 g/dL (ref 6.5–8.1)

## 2018-07-30 LAB — CULTURE, RESPIRATORY W GRAM STAIN: Culture: NORMAL

## 2018-07-30 LAB — MAGNESIUM: Magnesium: 2.1 mg/dL (ref 1.7–2.4)

## 2018-07-30 LAB — PHOSPHORUS: Phosphorus: 2.3 mg/dL — ABNORMAL LOW (ref 2.5–4.6)

## 2018-07-30 LAB — URINE CULTURE: Culture: <10000 — AB

## 2018-07-30 MED ORDER — FUROSEMIDE 10 MG/ML IJ SOLN
20.0000 mg | Freq: Once | INTRAMUSCULAR | Status: AC
  Administered 2018-07-30: 14:00:00 20 mg via INTRAVENOUS
  Filled 2018-07-30: qty 2

## 2018-07-30 MED ORDER — POTASSIUM PHOSPHATES 15 MMOLE/5ML IV SOLN
30.0000 mmol | Freq: Once | INTRAVENOUS | Status: AC
  Administered 2018-07-30: 30 mmol via INTRAVENOUS
  Filled 2018-07-30: qty 10

## 2018-07-30 NOTE — Progress Notes (Addendum)
SATURATION QUALIFICATIONS: (This note is used to comply with regulatory documentation for home oxygen)  Patient Saturations on Room Air at Rest = 93%  Patient Saturations on Room Air while Ambulating = 86%  Patient Saturations on 2 Liters of oxygen while Ambulating = 95%  Please briefly explain why patient needs home oxygen: patients O2 sat decreased to 86% while ambulating on RA. Patient complains of moderate SOB.  When 2L  applied, sats increased to 95%

## 2018-07-30 NOTE — Progress Notes (Deleted)
PT Cancellation Note  Patient Details Name: Jeremy Chan MRN: [id number] DOB: 1940-04-01   Cancelled Treatment:     Pt declined PT today secondary to nausea from antibiotic. Will attempt therapy tomorrow.   Lelon Mast 07/30/2018, 2:40 PM

## 2018-07-30 NOTE — TOC Progression Note (Deleted)
Transition of Care [facility]) - Progression Note    Patient Details  Name: THEDFORD BUNTON MRN: [id number] Date of Birth: Jun 27, 1940  Transition of Care [facility]) CM/SW Contact  Tzion Wedel, Juliann Pulse, RN Phone Number: 07/30/2018, 9:18 AM  Clinical Narrative: post d/c note:Received call from dtr Mimi that patient was weak & what should she do-CM explained that since he d/c yesterday we recommend that she contact his pcp with her medical concerns. I also recc that I could contact the [facility] agency [facility] to call her about the home visit. Mimi agreed to the advice, & appreciated my assistance. I did contact [facility] spoke to Clarise Cruz who will call Mimi. No further CM needs post d/c.        Barriers to Discharge: No Barriers Identified  Expected Discharge Plan and Services                           DME Arranged: Oxygen DME Agency: [employer] Date DME Agency Contacted: 07/29/18 Time DME Agency Contacted: 1522 Representative spoke with at DME Agency: [place]: PT, OT [place] Agency: [facility] [employer] Date [place]: 07/29/18 Time [place]: 1522 Representative spoke with at [place]: [place] ([place]) Interventions    Readmission Risk Interventions No flowsheet data found.

## 2018-07-30 NOTE — Progress Notes (Signed)
PROGRESS NOTE    Jeremy Chan  TKZ:[id number] DOB: August 04, 1940 DOA: 07/23/2018 PCP: Haze Rushing, MD    Brief Narrative:  HPI per 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 Almira Coaster  Presented withless of breath worse than his baseline and worse with exertion he got suddenly very short of breath today when he was trying to clean off the urine on the floor when his dog had an accident. When EMS arrived his oxygen saturation was down to 92% he was placed on nonrebreather secondary to increased work of breathing although by that time his oxygen saturation was 95% room air He has had decreased appetite and fatigue which is been chronic since his diagnosis of cancer having had significant coughing no fevers no chest pain he denies any leg swelling   Recently seen by his oncologist CT 07/16/18 showed enlargement of a right renal neoplasm w/d interval extension into the R renal vein and IVC, enlargement of multiple necrotic mediastinal lymph node conglomerates, and increased nodularity chest and abdomen. Stable multiple sclerotic and lytic foci of osseous metastatic dise.  Plan is to hold immunotherapy w/ switch to cabozantinib  recent CT chest/abd/pelvis from 07/16/2018 Showed progression of disease including enlargement of a right renal neoplasm with interval extension into the right renal vein and inferior vena cava, enlargement of multiple necrotic mediastinal lymph node conglomerates, and enlarging nodularity involving  the chest and abdomen  Interim History Patient remains on a heparin drip and case was discussed with In house oncology who recommends full dose anticoagulation with Lovenox at discharge I spoke with the patient's primary oncologist who recommends either way starting him on a anticoagulant and is okay with doing a NOAC.Marland Kitchen  Echocardiogram was done and not show any right heart strain and no evidence of any right systolic dysfunction and lower extremity duplex was done and was negative for VTE.  After discussion with his primary oncologist [name], he recommends obtaining an MRI and continue heparin drip for now before transitioning to rule out any metastatic lesions with hemorrhagic transformation in the brain.  MRI done and was within limits for detection on a noncontrast examination but showed no intracranial metastatic disease.  Atrophy with chronic microvascular ischemic change was seen and slight tonsillar ectopia but there is no intracranial mass lesion or acute stroke noted as well.  Overnight the patient became hypotensive was given a fluid bolus and was then started back on IV fluids.  Pulmonary was consulted for his hemoptysis and pulmonary embolus and because of the submassive size [name] recommending continuing heparin drip and transitioning to NOAC.  [name] evaluated for his hemoptysis and likely thought it may be secondary to PE for anticoagulation and was minimal or possibly from bronchitis and recommends continuing the anticoagulation and transitioning to po Eliquis.    Currently the patient is not a candidate for thrombolytics.  His oxygen requirements are weaning but patient still feels anxious about not having oxygen but he did desaturate without O2 and does require at least 1 Liter of Supplemental O2 via [place] as evidenced by his Ambulatory Home O2 Screen. Fluids are now discontinued. Antibiotics were started  with p.o. doxycycline given him questionable pneumonia versus bronchitis  as WBCs are elevating and procalcitonin is also going up slightly.  Chest x-ray did show slight progression of left lung base opacity earlier which may be due to increasing pleural effusion/electrophysical airspace disease/pneumonia and there is increase in right infrahilar opacity favoring atelectasis and there is concern for aspiration however speech therapy evaluated and currently recommending regular diet with thin liquids.  Overnight the patient did spike a small temperature and was pancultured. Likely temperatures in the setting of his PE but if he continues to have fevers will need to broaden antibiotic coverage.  Anticipating discharging home with home health and supplemental oxygen in a.m. if he is stable and has had no fevers overnight  Assessment & Plan:   Principal Problem:   Pulmonary embolism (HCC) Active Problems:   Hyperlipidemia   Essential hypertension   Renal cell cancer, right (HCC)   CAD (coronary artery disease)   Dehydration   AKI (acute kidney injury) ([place])   Hypothyroidism   Hypopituitarism ([place])   Fever  1 submassive PE Questionable etiology.  In the setting of hypercoagulable state secondary to malignancy that has metastasized to the lungs.  Cardiac enzymes which were cycled were negative.  CT angiogram chest done on admission showed acute PE in the right main pulmonary artery with extension to the right middle lobe and right lower lobe pulmonary arteries.  2D echo which was done was negative for right ventricular strain.  Lower extremity Dopplers were negative for DVT.  Patient initially placed on IV heparin for at least 48 hours and decision was made in discussions with patient's medical oncologist at [facility] long and at [facility] decision was to be made to start patient on NOAC.  MRI of the brain which was done was negative for any brain mets.  Patient likely will require home oxygen on discharge.  Patient with some complaints of shortness of breath and  as such we will give a dose of IV Lasix 20 mg x1.  Continue Eliquis.  Outpatient follow-up with hematology/oncology.  2.  Acute respiratory failure with hypoxia Secondary to submassive PE in the setting of metastatic disease.  Patient noted to also have spiked a low-grade fever and concern for possible bronchitis versus pneumonia in the setting of PE.  Patient currently afebrile.  Patient started on doxycycline which we will continue for now.  Patient was seen in consultation by pulmonary.  Patient will need home O2 on discharge.  Outpatient follow-up.  3.  Bronchitis versus pneumonia Improving clinically.  Patient currently afebrile.  Leukocytosis trended down.  Continue empiric doxycycline.  Follow.  4.  Hypophosphatemia Replete.  5.  Hyperlipidemia Continue statin.  6.  Hypertension Patient noted to have episodes of hypotension and as such home antihypertensive medications were held.  Blood pressure currently stable.  Follow.  7.  Metastatic right renal cell cancer/history of prostate cancer Being followed at [facility]  Renal cancer noted to have metastasized to the lung, bone, pelvis as well as soft tissue.  Patient currently on nivolumab. Patient also with history of laryngeal cancer that involved bilateral vocal cord status post radiation and history of prostate cancer treated with brachytherapy implantations.  Patient currently to have a change in immunotherapy given failure of previous treatment and progression which has not started yet and will start in the outpatient setting.  CT angiogram chest which was done showed worsening metastatic disease.Dr Alfredia Ferguson spoke with [name] at [facility]  patient's primary oncologist was planning on starting new immunotherapy at discharge, [name] concerned about possible metastatic lesions to the brain given worsening metastatic disease acutely MRI was recommended which was done which was negative for any metastatic disease.   Outpatient follow-up with oncology.  8.  Coronary artery disease status post stents Stable.  Continue aspirin, statin.  Outpatient follow-up.  9.  Dehydration Resolved with hydration.  Follow.  10.  Acute kidney injury Felt likely to be prerenal in nature.  Patient was hydrated IV fluids which have been stopped.  Patient noted earlier on to have some pedal edema which is improved.  Avoid nephrotoxic medications.  Monitor renal function closely with IV Lasix 20 mg x 1.  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  Outpatient follow-up with endocrinology.  12.  Microcytic anemia Stable.  Follow.  13.  Thrombocytopenia No signs of bleeding.  Platelet count improved and currently at 188.  Outpatient follow-up.  14.  Failure to thrive/poor oral intake Continue nutritional supplementation.  Outpatient follow-up.   DVT prophylaxis: Eliquis Code Status: Full Family Communication: Updated patient.  No family at bedside. Disposition Plan: Likely home with home health when clinically improved hopefully in the next 24 to 48 hours.   Consultants:   PCCM: Dr Ander Slade 07/26/2018  Procedures:   CT angiogram chest 07/23/2018  Chest x-ray 07/23/2018, 07/25/2018, 07/26/2018, 07/28/2018, 07/29/2018, 07/30/2018  MRI brain 07/25/2018  2D echo 07/24/2018  Lower extremity Doppler 07/24/2018  Antimicrobials:   Doxycycline 07/27/2018>>>> 08/06/2018   Subjective: Patient sitting up in bed.  Patient with complaints of shortness of breath and generalized weakness.  With some minimal hemoptysis.  Objective: Vitals:   07/29/18 2046 07/30/18 0532 07/30/18 1020 07/30/18 1026  BP: 118/72 129/66    Pulse: 84 85    Resp: 19 20    Temp: 99.6 F (37.6 C) 99 F (37.2 C)    TempSrc: Oral Oral    SpO2: 97% 100% 93% (!) 86%  Weight:      Height:        Intake/Output Summary (Last 24 hours) at 07/30/2018 1302 Last data filed at 07/30/2018 7371 Gross per 24 hour  Intake  561.14 ml  Output 750 ml  Net -188.86 ml   Filed Weights   07/23/18 1824  Weight: 88.5 kg    Examination:  General exam: Appears calm and comfortable  Respiratory system: Fair air movement.  Some scattered crackles.  No wheezing.  Speaking in full sentences.  Cardiovascular system: S1 & S2 heard, RRR. No JVD, murmurs, rubs, gallops or clicks. No pedal edema. Gastrointestinal system: Abdomen is nondistended, soft and nontender. No organomegaly or masses felt. Normal bowel sounds heard. Central nervous system: Alert and oriented. No focal neurological deficits. Extremities: Symmetric 5 x 5 power. Skin: No rashes, lesions or ulcers Psychiatry: Judgement and insight appear normal. Mood & affect appropriate.     Data Reviewed: I have personally reviewed following labs and imaging studies  CBC: Recent Labs  Lab 07/26/18 0525 07/27/18 0503 07/28/18 0451 07/29/18 0619 07/30/18 0521  WBC 16.4* 17.1* 13.8* 10.9* 11.6*  NEUTROABS 12.8* 13.7* 11.2* 8.5* 9.0*  HGB 11.1* 10.2* 9.8* 10.4* 10.6*  HCT 34.8* 32.4* 30.5* 32.6* 33.5*  MCV 101.2* 102.5* 101.0* 100.9* 100.6*  PLT 145* 148* 152 158 062   Basic Metabolic Panel: Recent Labs  Lab 07/26/18 0525 07/27/18 0503 07/28/18 0451 07/29/18 0619 07/30/18 0521  NA 134* 130* 132* 133* 133*  K 4.0 4.6 4.0 3.7  3.8  CL 102 99 101 101 100  CO2 20* 22 21* 23 24  GLUCOSE 107* 102* 104* 92 100*  BUN 19 21 20 17 16   CREATININE 1.27* 1.22 1.17 1.12 1.07  CALCIUM 8.5* 7.8* 7.9* 8.0* 8.5*  MG 2.0 2.1 2.0 2.1 2.1  PHOS 2.5 2.4* 2.2* 2.2* 2.3*   GFR: Estimated Creatinine Clearance: 62.2 mL/min (by C-G formula based on SCr of 1.07 mg/dL). Liver Function Tests: Recent Labs  Lab 07/26/18 0525 07/27/18 0503 07/28/18 0451 07/29/18 0619 07/30/18 0521  AST 12* 10* 11* 17 28  ALT 11 8 10 15 24   ALKPHOS 58 53 59 73 100  BILITOT 1.1 0.9 0.3 0.4 0.4  PROT 6.7 6.1* 6.0* 6.2* 6.5  ALBUMIN 3.3* 3.0* 2.8* 2.7* 2.7*   No results for input(s):  LIPASE, AMYLASE in the last 168 hours. No results for input(s): AMMONIA in the last 168 hours. Coagulation Profile: Recent Labs  Lab 07/23/18 1857  INR 1.0   Cardiac Enzymes: Recent Labs  Lab 07/23/18 1857 07/23/18 2151 07/24/18 0358 07/24/18 0949  TROPONINI <0.03 <0.03 <0.03 <0.03   BNP (last 3 results) No results for input(s): PROBNP in the last 8760 hours. HbA1C: No results for input(s): HGBA1C in the last 72 hours. CBG: No results for input(s): GLUCAP in the last 168 hours. Lipid Profile: No results for input(s): CHOL, HDL, LDLCALC, TRIG, CHOLHDL, LDLDIRECT in the last 72 hours. Thyroid Function Tests: No results for input(s): TSH, T4TOTAL, FREET4, T3FREE, THYROIDAB in the last 72 hours. Anemia Panel: No results for input(s): VITAMINB12, FOLATE, FERRITIN, TIBC, IRON, RETICCTPCT in the last 72 hours. Sepsis Labs: Recent Labs  Lab 07/26/18 0926 07/27/18 0503 07/28/18 0451  PROCALCITON 0.50 1.09 1.03    Recent Results (from the past 240 hour(s))  SARS Coronavirus 2 (CEPHEID - Performed in [place] hospital lab), Hosp Order     Status: None   Collection Time: 07/23/18  6:57 PM   Specimen: Nasopharyngeal Swab  Result Value Ref Range Status   SARS Coronavirus 2 NEGATIVE NEGATIVE Final    Comment: (NOTE) If result is NEGATIVE SARS-CoV-2 target nucleic acids are NOT DETECTED. The SARS-CoV-2 RNA is generally detectable in upper and lower  respiratory specimens during the acute phase of infection. The lowest  concentration of SARS-CoV-2 viral copies this assay can detect is 250  copies / mL. A negative result does not preclude SARS-CoV-2 infection  and should not be used as the sole basis for treatment or other  patient management decisions.  A negative result may occur with  improper specimen collection / handling, submission of specimen other  than nasopharyngeal swab, presence of viral mutation(s) within the  areas targeted by this assay, and inadequate number  of viral copies  (<250 copies / mL). A negative result must be combined with clinical  observations, patient history, and epidemiological information. If result is POSITIVE SARS-CoV-2 target nucleic acids are DETECTED. The SARS-CoV-2 RNA is generally detectable in upper and lower  respiratory specimens dur ing the acute phase of infection.  Positive  results are indicative of active infection with SARS-CoV-2.  Clinical  correlation with patient history and other diagnostic information is  necessary to determine patient infection status.  Positive results do  not rule out bacterial infection or co-infection with other viruses. If result is PRESUMPTIVE POSTIVE SARS-CoV-2 nucleic acids MAY BE PRESENT.   A presumptive positive result was obtained on the submitted specimen  and confirmed on repeat testing.  While 2019 novel  coronavirus  (SARS-CoV-2) nucleic acids may be present in the submitted sample  additional confirmatory testing may be necessary for epidemiological  and / or clinical management purposes  to differentiate between  SARS-CoV-2 and other Sarbecovirus currently known to infect humans.  If clinically indicated additional testing with an alternate test  methodology [phone]) is advised. The SARS-CoV-2 RNA is generally  detectable in upper and lower respiratory sp ecimens during the acute  phase of infection. The expected result is Negative. Fact Sheet for Patients:  StrictlyIdeas.no Fact Sheet for Healthcare Providers: BankingDealers.co.za This test is not yet approved or cleared by the [place] FDA and has been authorized for detection and/or diagnosis of SARS-CoV-2 by FDA under an Emergency Use Authorization (EUA).  This EUA will remain in effect (meaning this test can be used) for the duration of the COVID-19 declaration under Section 564(b)(1) of the Act, 21 U.S.C. section 360bbb-3(b)(1), unless the authorization is  terminated or revoked sooner. Performed at [facility], [place] [address] [place], [place] [address]   MRSA PCR Screening     Status: None   Collection Time: 07/27/18  8:43 AM   Specimen: Nasopharyngeal  Result Value Ref Range Status   MRSA by PCR NEGATIVE NEGATIVE Final    Comment:        The GeneXpert MRSA Assay (FDA approved for NASAL specimens only), is one component of a comprehensive MRSA colonization surveillance program. It is not intended to diagnose MRSA infection nor to guide or monitor treatment for MRSA infections. Performed at [facility], [place] [address], [place], [place] [address]   Expectorated sputum assessment w rflx to resp cult     Status: None   Collection Time: 07/27/18  8:44 AM   Specimen: Expectorated Sputum  Result Value Ref Range Status   Specimen Description EXPECTORATED SPUTUM  Final   Special Requests NONE  Final   Sputum evaluation   Final    THIS SPECIMEN IS ACCEPTABLE FOR SPUTUM CULTURE Performed at [facility], [place] [address], [place], [place] [address]    Report Status 07/27/2018 FINAL  Final  Culture, respiratory     Status: None   Collection Time: 07/27/18  8:44 AM  Result Value Ref Range Status   Specimen Description   Final    EXPECTORATED SPUTUM Performed at [place] [address] [place], [place] [address]    Special Requests   Final    NONE Reflexed from [phone] Performed at [place] [address] [place], [place] [address]    Gram Stain   Final    ABUNDANT SQUAMOUS EPITHELIAL CELLS PRESENT ABUNDANT WBC PRESENT, PREDOMINANTLY PMN ABUNDANT GRAM POSITIVE COCCI ABUNDANT GRAM NEGATIVE RODS MODERATE GRAM POSITIVE RODS    Culture   Final    Consistent with normal respiratory flora. Performed at [facility], [place] [address] [place], [place] [address]    Report Status 07/30/2018 FINAL  Final  SARS Coronavirus 2  (CEPHEID - Performed in [place] hospital lab), Hosp Order     Status: None   Collection Time: 07/27/18  8:52 AM   Specimen: Nasopharyngeal Swab  Result Value Ref Range Status   SARS Coronavirus 2 NEGATIVE NEGATIVE Final    Comment: (NOTE) If result is NEGATIVE SARS-CoV-2 target nucleic acids are NOT DETECTED. The SARS-CoV-2 RNA is generally detectable in upper and lower  respiratory specimens during the acute phase of infection. The lowest  concentration of SARS-CoV-2  viral copies this assay can detect is 250  copies / mL. A negative result does not preclude SARS-CoV-2 infection  and should not be used as the sole basis for treatment or other  patient management decisions.  A negative result may occur with  improper specimen collection / handling, submission of specimen other  than nasopharyngeal swab, presence of viral mutation(s) within the  areas targeted by this assay, and inadequate number of viral copies  (<250 copies / mL). A negative result must be combined with clinical  observations, patient history, and epidemiological information. If result is POSITIVE SARS-CoV-2 target nucleic acids are DETECTED. The SARS-CoV-2 RNA is generally detectable in upper and lower  respiratory specimens dur ing the acute phase of infection.  Positive  results are indicative of active infection with SARS-CoV-2.  Clinical  correlation with patient history and other diagnostic information is  necessary to determine patient infection status.  Positive results do  not rule out bacterial infection or co-infection with other viruses. If result is PRESUMPTIVE POSTIVE SARS-CoV-2 nucleic acids MAY BE PRESENT.   A presumptive positive result was obtained on the submitted specimen  and confirmed on repeat testing.  While 2019 novel coronavirus  (SARS-CoV-2) nucleic acids may be present in the submitted sample  additional confirmatory testing may be necessary for epidemiological  and / or clinical  management purposes  to differentiate between  SARS-CoV-2 and other Sarbecovirus currently known to infect humans.  If clinically indicated additional testing with an alternate test  methodology [phone]) is advised. The SARS-CoV-2 RNA is generally  detectable in upper and lower respiratory sp ecimens during the acute  phase of infection. The expected result is Negative. Fact Sheet for Patients:  StrictlyIdeas.no Fact Sheet for Healthcare Providers: BankingDealers.co.za This test is not yet approved or cleared by the [place] FDA and has been authorized for detection and/or diagnosis of SARS-CoV-2 by FDA under an Emergency Use Authorization (EUA).  This EUA will remain in effect (meaning this test can be used) for the duration of the COVID-19 declaration under Section 564(b)(1) of the Act, 21 U.S.C. section 360bbb-3(b)(1), unless the authorization is terminated or revoked sooner. Performed at [facility], [place] [address] [place], [place] [address]   Culture, Urine     Status: Abnormal   Collection Time: 07/29/18  8:21 AM   Specimen: Urine, Random  Result Value Ref Range Status   Specimen Description   Final    URINE, RANDOM Performed at [place] [address] [place], [place] [address]    Special Requests   Final    NONE Performed at [facility], [place] [address] [place], [place] [address]    Culture (A)  Final    <10,000 COLONIES/mL INSIGNIFICANT GROWTH Performed at [place] [address] [place], [place] [address]    Report Status 07/30/2018 FINAL  Final         Radiology Studies: Dg Chest Port 1 View  Result Date: 07/30/2018 CLINICAL DATA:  Shortness of breath, history GERD, lung cancer, hypertension, prostate cancer EXAM: PORTABLE CHEST 1 VIEW COMPARISON:  Portable exam 0548 hours compared to 07/29/2018 FINDINGS: Normal heart size,  mediastinal contours and pulmonary vascularity. Persistent bibasilar opacities which may represent atelectasis, less likely infiltrate. Persistent small RIGHT pleural effusion. Upper lungs clear. No pneumothorax. IMPRESSION: Persistent bibasilar opacities representing a small RIGHT pleural effusion and likely atelectasis at both lung bases. Electronically Signed   By: Crist Infante.D.  On: 07/30/2018 09:08   Dg Chest Port 1 View  Result Date: 07/29/2018 CLINICAL DATA:  Shortness of breath. EXAM: PORTABLE CHEST 1 VIEW COMPARISON:  Radiograph of July 28, 2018. FINDINGS: Stable cardiomegaly. No pneumothorax is noted. Stable elevated left hemidiaphragm is noted. Mild left basilar atelectasis or infiltrate is noted. Mild right basilar atelectasis or infiltrate is noted with small right pleural effusion. Bony thorax is unremarkable. IMPRESSION: Stable elevated left hemidiaphragm with mild left basilar subsegmental atelectasis or infiltrate. Mild right basilar atelectasis or infiltrate is noted with small right pleural effusion. Electronically Signed   By: Marijo Conception M.D.   On: 07/29/2018 08:42        Scheduled Meds:  apixaban  10 mg Oral BID   [START ON 08/03/2018] apixaban  5 mg Oral BID   aspirin EC  81 mg Oral Daily   atorvastatin  40 mg Oral Daily   doxycycline  100 mg Oral Q12H   feeding supplement (ENSURE ENLIVE)  237 mL Oral BID BM   levothyroxine  75 mcg Oral QAC breakfast   multivitamin with minerals  1 tablet Oral Daily   predniSONE  5 mg Oral Q breakfast   Continuous Infusions:  potassium PHOSPHATE IVPB (in mmol) 30 mmol (07/30/18 1022)     LOS: 6 days    Time spent: 40 mins    Irine Seal, MD [facility]  If 7PM-7AM, please contact night-coverage [email or web address] 07/30/2018, 1:02 PM

## 2018-07-30 NOTE — Progress Notes (Signed)
PT Cancellation Note  Patient Details Name: Jeremy Chan MRN: [id number] DOB: 24-Sep-1940   Cancelled Treatment:     Pt refused PT this afternoon. Pt reported possible d/c home tomorrow and he has therapy arranged. Pt stated he did ambulate earlier today and wants to rest.    Lelon Mast 07/30/2018, 2:50 PM

## 2018-07-31 LAB — BASIC METABOLIC PANEL
Anion gap: 9 (ref 5–15)
BUN: 17 mg/dL (ref 8–23)
CO2: 26 mmol/L (ref 22–32)
Calcium: 8.6 mg/dL — ABNORMAL LOW (ref 8.9–10.3)
Chloride: 98 mmol/L (ref 98–111)
Creatinine, Ser: 1.05 mg/dL (ref 0.61–1.24)
GFR calc Af Amer: >60 mL/min (ref >60)
GFR calc non Af Amer: >60 mL/min (ref >60)
Glucose, Bld: 93 mg/dL (ref 70–99)
Potassium: 4 mmol/L (ref 3.5–5.1)
Sodium: 133 mmol/L — ABNORMAL LOW (ref 135–145)

## 2018-07-31 LAB — PHOSPHORUS: Phosphorus: 3.4 mg/dL (ref 2.5–4.6)

## 2018-07-31 MED ORDER — FUROSEMIDE 10 MG/ML IJ SOLN
20.0000 mg | Freq: Once | INTRAMUSCULAR | Status: AC
  Administered 2018-07-31: 19:00:00 20 mg via INTRAVENOUS
  Filled 2018-07-31: qty 2

## 2018-07-31 NOTE — Progress Notes (Signed)
Patient complained about chest pain. RN assessed patient. Patient is alert and oriented x4. Patient asked to speak to MD. Paged MD as well and got a call back that MD will come to see patient soon.

## 2018-07-31 NOTE — Progress Notes (Signed)
OT Cancellation Note  Patient Details Name: Jeremy Chan MRN: [id number] DOB: 12-06-1940   Cancelled Treatment:    Reason Eval/Treat Not Completed: Other (comment). Noted pt with CP and MD to see. Also just took Zofran. Will return later as schedule permits and as pt tolerates  Jeremy Chan 07/31/2018, 10:35 AM  Lesle Chris, OTR/L Acute Rehabilitation Services [phone] [facility] pager [phone] office 07/31/2018

## 2018-07-31 NOTE — Progress Notes (Signed)
Physical Therapy Treatment Patient Details Name: Jeremy Chan MRN: [id number] DOB: 10/06/1940 Today's Date: 07/31/2018    History of Present Illness Jeremy Chan is a 78 y.o. admitted with SOB  found to have PE and initiated therapeutic heparin. 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    PT Comments    Pt progressing, incr gait tolerance today (has declined PT the last few times). Continue PT POC  Follow Up Recommendations  Home health PT;Supervision for mobility/OOB     Equipment Recommendations  None recommended by PT    Recommendations for Other Services       Precautions / Restrictions Precautions Precautions: Fall Precaution Comments: on supplemental O2 Restrictions Weight Bearing Restrictions: No    Mobility  Bed Mobility Overal bed mobility: Needs Assistance Bed Mobility: Supine to Sit     Supine to sit: Min assist     General bed mobility comments: incr time, assist with trunk  Transfers Overall transfer level: Needs assistance Equipment used: Rolling walker (2 wheeled) Transfers: Sit to/from Stand Sit to Stand: Min assist         General transfer comment: cues for hand placement, incr time   Ambulation/Gait Ambulation/Gait assistance: Min guard;Min assist Gait Distance (Feet): 25 Feet Assistive device: Rolling walker (2 wheeled) Gait Pattern/deviations: Step-through pattern;Decreased stride length     General Gait Details: cues for sequence, pt fatigued and needed seated rest after 25', mild dyspnea; sats 95%   Stairs             Wheelchair Mobility    Modified Rankin (Stroke Patients Only)        Balance           Standing balance support: During functional activity;Bilateral upper extremity supported;No upper extremity supported Standing balance-Leahy Scale: Fair Standing balance comment: requires UE support for dynamic                            Cognition Arousal/Alertness: Awake/alert Behavior During Therapy: WFL for tasks assessed/performed Overall Cognitive Status: Within Functional Limits for tasks assessed                                        Exercises      General Comments        Pertinent Vitals/Pain Pain Assessment: 0-10 Pain Score: 4  Pain Location: chest  Pain Descriptors / Indicators: Sore Pain Intervention(s): Limited activity within patient's tolerance;Monitored during session    Home Living                      Prior Function            PT Goals (current goals can now be found in the care plan section) Acute Rehab PT Goals Patient Stated Goal: to go home and be with his wife PT Goal Formulation: With patient Time For Goal Achievement: 08/12/18 Potential to Achieve Goals: Fair Progress towards PT goals: Progressing toward goals    Frequency    Min 3X/week      PT Plan Current plan remains appropriate    Co-evaluation  AM-PAC PT "6 Clicks" Mobility   Outcome Measure  Help needed turning from your back to your side while in a flat bed without using bedrails?: A Little Help needed moving from lying on your back to sitting on the side of a flat bed without using bedrails?: A Little Help needed moving to and from a bed to a chair (including a wheelchair)?: A Little Help needed standing up from a chair using your arms (e.g., wheelchair or bedside chair)?: A Little Help needed to walk in hospital room?: A Little Help needed climbing 3-5 steps with a railing? : A Lot 6 Click Score: 17    End of Session Equipment Utilized During Treatment: Oxygen Activity Tolerance:  Patient tolerated treatment well;Patient limited by fatigue Patient left: in chair;with call bell/phone within reach;with chair alarm set Nurse Communication: Mobility status PT Visit Diagnosis: Difficulty in walking, not elsewhere classified (R26.2)     Time: 1432-1450 PT Time Calculation (min) (ACUTE ONLY): 18 min  Charges:  $Gait Training: 8-22 mins                     Kenyon Ana, PT  Pager: [phone] Acute Rehab Dept [facility]): [phone]   07/31/2018    [facility] 07/31/2018, 3:16 PM

## 2018-07-31 NOTE — Progress Notes (Signed)
PROGRESS NOTE    TREVYON SWOR  LFY:[id number] DOB: [birth year] DOA: 07/23/2018 PCP: Haze Rushing, MD    Brief Narrative:  HPI per 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 Almira Coaster  Presented withless of breath worse than his baseline and worse with exertion he got suddenly very short of breath today when he was trying to clean off the urine on the floor when his dog had an accident. When EMS arrived his oxygen saturation was down to 92% he was placed on nonrebreather secondary to increased work of breathing although by that time his oxygen saturation was 95% room air He has had decreased appetite and fatigue which is been chronic since his diagnosis of cancer having had significant coughing no fevers no chest pain he denies any leg swelling   Recently seen by his oncologist CT 07/16/18 showed enlargement of a right renal neoplasm w/d interval extension into the R renal vein and IVC, enlargement of multiple necrotic mediastinal lymph node conglomerates, and increased nodularity chest and abdomen. Stable multiple sclerotic and lytic foci of osseous metastatic dise.  Plan is to hold immunotherapy w/ switch to cabozantinib  recent CT chest/abd/pelvis from 07/16/2018 Showed progression of disease including enlargement of a right renal neoplasm with interval extension into the right renal vein and inferior vena cava, enlargement of multiple necrotic mediastinal lymph node conglomerates, and enlarging nodularity involving  the chest and abdomen  Interim History Patient remains on a heparin drip and case was discussed with In house oncology who recommends full dose anticoagulation with Lovenox at discharge I spoke with the patient's primary oncologist who recommends either way starting him on a anticoagulant and is okay with doing a NOAC.Marland Kitchen  Echocardiogram was done and not show any right heart strain and no evidence of any right systolic dysfunction and lower extremity duplex was done and was negative for VTE.  After discussion with his primary oncologist [name], he recommends obtaining an MRI and continue heparin drip for now before transitioning to rule out any metastatic lesions with hemorrhagic transformation in the brain.  MRI done and was within limits for detection on a noncontrast examination but showed no intracranial metastatic disease.  Atrophy with chronic microvascular ischemic change was seen and slight tonsillar ectopia but there is no intracranial mass lesion or acute stroke noted as well.  Overnight the patient became hypotensive was given a fluid bolus and was then started back on IV fluids.  Pulmonary was consulted for his hemoptysis and pulmonary embolus and because of the submassive size [name] recommending continuing heparin drip and transitioning to NOAC.  [name] evaluated for his hemoptysis and likely thought it may be secondary to PE for anticoagulation and was minimal or possibly from bronchitis and recommends continuing the anticoagulation and transitioning to po Eliquis.    Currently the patient is not a candidate for thrombolytics.  His oxygen requirements are weaning but patient still feels anxious about not having oxygen but he did desaturate without O2 and does require at least 1 Liter of Supplemental O2 via [place] as evidenced by his Ambulatory Home O2 Screen. Fluids are now discontinued. Antibiotics were started  with p.o. doxycycline given him questionable pneumonia versus bronchitis  as WBCs are elevating and procalcitonin is also going up slightly.  Chest x-ray did show slight progression of left lung base opacity earlier which may be due to increasing pleural effusion/electrophysical airspace disease/pneumonia and there is increase in right infrahilar opacity favoring atelectasis and there is concern for aspiration however speech therapy evaluated and currently recommending regular diet with thin liquids.  Overnight the patient did spike a small temperature and was pancultured. Likely temperatures in the setting of his PE but if he continues to have fevers will need to broaden antibiotic coverage.  Anticipating discharging home with home health and supplemental oxygen in a.m. if he is stable and has had no fevers overnight  Assessment & Plan:   Principal Problem:   Pulmonary embolism (HCC) Active Problems:   Hyperlipidemia   Essential hypertension   Renal cell cancer, right (HCC)   CAD (coronary artery disease)   Dehydration   AKI (acute kidney injury) ([place])   Hypothyroidism   Hypopituitarism ([place])   Fever  1 submassive PE Questionable etiology.  In the setting of hypercoagulable state secondary to malignancy that has metastasized to the lungs.  Cardiac enzymes which were cycled were negative.  CT angiogram chest done on admission showed acute PE in the right main pulmonary artery with extension to the right middle lobe and right lower lobe pulmonary arteries.  2D echo which was done was negative for right ventricular strain.  Lower extremity Dopplers were negative for DVT.  Patient initially placed on IV heparin for at least 48 hours and decision was made in discussions with patient's medical oncologist at [facility] and at [facility] decision was to be made to start patient on NOAC.  MRI of the brain which was done was negative for any brain mets.  Patient likely will require home oxygen on discharge.  Patient with some complaints of shortness of breath and  as such was given Lasix 20 mg IV x1 on 07/30/2018 with a urine output of 2.125 L over the past 24 hours.  Patient with some bibasilar crackles.  We will give another dose of 20 mg IV Lasix x1.  Continue Eliquis.  Outpatient follow-up with hematology/oncology.  2.  Acute respiratory failure with hypoxia Secondary to submassive PE in the setting of metastatic disease.  Patient noted to also have spiked a low-grade fever and concern for possible bronchitis versus pneumonia in the setting of PE.  Patient currently afebrile.  Patient started on doxycycline which we will continue for now.  Patient was seen in consultation by pulmonary.  Patient will need home O2 on discharge.  Patient given a dose of IV Lasix yesterday with a urine output of 2.125 L over the past 24 hours.  Will give a dose of Lasix 20 mg IV x1.  Outpatient follow-up.  3.  Bronchitis versus pneumonia Improving clinically.  Patient currently afebrile.  Leukocytosis trended down.  Continue empiric doxycycline and treat for 7 to 10 days.  Follow.  4.  Hypophosphatemia Repleted.  5.  Hyperlipidemia Continue statin.  6.  Hypertension Patient noted to have episodes of hypotension and as such home antihypertensive medications were held.  Blood pressure currently stable.  Follow.  7.  Metastatic right renal cell cancer/history of prostate cancer Being followed at [facility].  Renal cancer noted to have metastasized to the lung, bone, pelvis as well as soft tissue.  Patient currently on nivolumab. Patient also with history  of laryngeal cancer that involved bilateral vocal cord status post radiation and history of prostate cancer treated with brachytherapy implantations.  Patient currently to have a change in immunotherapy given failure of previous treatment and progression which has not started yet and will start in the outpatient setting.  CT angiogram chest which was done showed worsening metastatic disease.Dr Alfredia Ferguson spoke  with [name] at [facility] patient's primary oncologist was planning on starting new immunotherapy at discharge, [name] concerned about possible metastatic lesions to the brain given worsening metastatic disease acutely MRI was recommended which was done which was negative for any metastatic disease.  Outpatient follow-up with oncology.  8.  Coronary artery disease status post stents Stable.  Continue aspirin, statin.  Outpatient follow-up.  9.  Dehydration Resolved with hydration.  Follow.  10.  Acute kidney injury Felt likely to be prerenal in nature.  Patient was hydrated IV fluids which have been stopped.  Patient noted earlier on to have some pedal edema which is improved.  Avoid nephrotoxic medications.  Patient given a dose of IV Lasix yesterday with a urine output 2.125 L over the past 24 hours.   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  Outpatient follow-up with endocrinology.  12.  Microcytic anemia Stable.  Follow.  13.  Thrombocytopenia No signs of bleeding.  Platelet count improved and currently at 188.  Outpatient follow-up.  14.  Failure to thrive/poor oral intake Continue nutritional supplementation.  Outpatient follow-up.   DVT prophylaxis: Eliquis Code Status: Full Family Communication: Updated patient.  No family at bedside. Disposition Plan: Likely home with home health when clinically improved hopefully in the next 24 to 48 hours.   Consultants:   PCCM: Dr Ander Slade 07/26/2018  Procedures:   CT angiogram chest 07/23/2018  Chest x-ray 07/23/2018, 07/25/2018, 07/26/2018, 07/28/2018, 07/29/2018, 07/30/2018  MRI brain 07/25/2018  2D echo 07/24/2018  Lower extremity Doppler 07/24/2018  Antimicrobials:   Doxycycline 07/27/2018>>>> 08/06/2018   Subjective: Patient sitting up in bed.  Patient complains of right-sided chest pain with deep inspiration.  Some minimal hemoptysis.  Complains of shortness of breath with no  significant change per patient.   Objective: Vitals:   07/30/18 2040 07/31/18 0500 07/31/18 0502 07/31/18 1130  BP: 122/73  116/68   Pulse: 78  84   Resp: 17  17   Temp: 98.4 F (36.9 C)  97.9 F (36.6 C)   TempSrc: Oral  Oral   SpO2: 97%  97% 100%  Weight:  91.2 kg 91.2 kg   Height:        Intake/Output Summary (Last 24 hours) at 07/31/2018 1211 Last data filed at 07/31/2018 0900 Gross per 24 hour  Intake 691.62 ml  Output 2175 ml  Net -1483.38 ml   Filed Weights   07/23/18 1824 07/31/18 0500 07/31/18 0502  Weight: 88.5 kg 91.2 kg 91.2 kg    Examination:  General exam: NAD.  Respiratory system: Fair air movement.  Some decreasing bibasilar crackles.  No wheezing.  Speaking in full sentences.  Cardiovascular system: Regular rate rhythm no murmurs rubs or gallops.  No JVD.  No lower extremity edema.  Gastrointestinal system: Abdomen is soft, nontender, nondistended, positive bowel sounds.  No rebound.  No guarding.   Central nervous system: Alert and oriented. No focal neurological deficits. Extremities: Symmetric 5 x 5 power. Skin: No rashes, lesions or ulcers Psychiatry: Judgement and insight appear normal. Mood & affect appropriate.     Data Reviewed: I have  personally reviewed following labs and imaging studies  CBC: Recent Labs  Lab 07/26/18 0525 07/27/18 0503 07/28/18 0451 07/29/18 0619 07/30/18 0521  WBC 16.4* 17.1* 13.8* 10.9* 11.6*  NEUTROABS 12.8* 13.7* 11.2* 8.5* 9.0*  HGB 11.1* 10.2* 9.8* 10.4* 10.6*  HCT 34.8* 32.4* 30.5* 32.6* 33.5*  MCV 101.2* 102.5* 101.0* 100.9* 100.6*  PLT 145* 148* 152 158 403   Basic Metabolic Panel: Recent Labs  Lab 07/26/18 0525 07/27/18 0503 07/28/18 0451 07/29/18 0619 07/30/18 0521 07/31/18 0520  NA 134* 130* 132* 133* 133* 133*  K 4.0 4.6 4.0 3.7 3.8 4.0  CL 102 99 101 101 100 98  CO2 20* 22 21* 23 24 26   GLUCOSE 107* 102* 104* 92 100* 93  BUN 19 21 20 17 16 17   CREATININE 1.27* 1.22 1.17 1.12 1.07 1.05    CALCIUM 8.5* 7.8* 7.9* 8.0* 8.5* 8.6*  MG 2.0 2.1 2.0 2.1 2.1  --   PHOS 2.5 2.4* 2.2* 2.2* 2.3* 3.4   GFR: Estimated Creatinine Clearance: 64.2 mL/min (by C-G formula based on SCr of 1.05 mg/dL). Liver Function Tests: Recent Labs  Lab 07/26/18 0525 07/27/18 0503 07/28/18 0451 07/29/18 0619 07/30/18 0521  AST 12* 10* 11* 17 28  ALT 11 8 10 15 24   ALKPHOS 58 53 59 73 100  BILITOT 1.1 0.9 0.3 0.4 0.4  PROT 6.7 6.1* 6.0* 6.2* 6.5  ALBUMIN 3.3* 3.0* 2.8* 2.7* 2.7*   No results for input(s): LIPASE, AMYLASE in the last 168 hours. No results for input(s): AMMONIA in the last 168 hours. Coagulation Profile: No results for input(s): INR, PROTIME in the last 168 hours. Cardiac Enzymes: No results for input(s): CKTOTAL, CKMB, CKMBINDEX, TROPONINI in the last 168 hours. BNP (last 3 results) No results for input(s): PROBNP in the last 8760 hours. HbA1C: No results for input(s): HGBA1C in the last 72 hours. CBG: No results for input(s): GLUCAP in the last 168 hours. Lipid Profile: No results for input(s): CHOL, HDL, LDLCALC, TRIG, CHOLHDL, LDLDIRECT in the last 72 hours. Thyroid Function Tests: No results for input(s): TSH, T4TOTAL, FREET4, T3FREE, THYROIDAB in the last 72 hours. Anemia Panel: No results for input(s): VITAMINB12, FOLATE, FERRITIN, TIBC, IRON, RETICCTPCT in the last 72 hours. Sepsis Labs: Recent Labs  Lab 07/26/18 0926 07/27/18 0503 07/28/18 0451  PROCALCITON 0.50 1.09 1.03    Recent Results (from the past 240 hour(s))  SARS Coronavirus 2 (CEPHEID - Performed in [place] hospital lab), Hosp Order     Status: None   Collection Time: 07/23/18  6:57 PM   Specimen: Nasopharyngeal Swab  Result Value Ref Range Status   SARS Coronavirus 2 NEGATIVE NEGATIVE Final    Comment: (NOTE) If result is NEGATIVE SARS-CoV-2 target nucleic acids are NOT DETECTED. The SARS-CoV-2 RNA is generally detectable in upper and lower  respiratory specimens during the acute phase  of infection. The lowest  concentration of SARS-CoV-2 viral copies this assay can detect is 250  copies / mL. A negative result does not preclude SARS-CoV-2 infection  and should not be used as the sole basis for treatment or other  patient management decisions.  A negative result may occur with  improper specimen collection / handling, submission of specimen other  than nasopharyngeal swab, presence of viral mutation(s) within the  areas targeted by this assay, and inadequate number of viral copies  (<250 copies / mL). A negative result must be combined with clinical  observations, patient history, and epidemiological information. If result is  POSITIVE SARS-CoV-2 target nucleic acids are DETECTED. The SARS-CoV-2 RNA is generally detectable in upper and lower  respiratory specimens dur ing the acute phase of infection.  Positive  results are indicative of active infection with SARS-CoV-2.  Clinical  correlation with patient history and other diagnostic information is  necessary to determine patient infection status.  Positive results do  not rule out bacterial infection or co-infection with other viruses. If result is PRESUMPTIVE POSTIVE SARS-CoV-2 nucleic acids MAY BE PRESENT.   A presumptive positive result was obtained on the submitted specimen  and confirmed on repeat testing.  While 2019 novel coronavirus  (SARS-CoV-2) nucleic acids may be present in the submitted sample  additional confirmatory testing may be necessary for epidemiological  and / or clinical management purposes  to differentiate between  SARS-CoV-2 and other Sarbecovirus currently known to infect humans.  If clinically indicated additional testing with an alternate test  methodology [phone]) is advised. The SARS-CoV-2 RNA is generally  detectable in upper and lower respiratory sp ecimens during the acute  phase of infection. The expected result is Negative. Fact Sheet for Patients:   StrictlyIdeas.no Fact Sheet for Healthcare Providers: BankingDealers.co.za This test is not yet approved or cleared by the [place] FDA and has been authorized for detection and/or diagnosis of SARS-CoV-2 by FDA under an Emergency Use Authorization (EUA).  This EUA will remain in effect (meaning this test can be used) for the duration of the COVID-19 declaration under Section 564(b)(1) of the Act, 21 U.S.C. section 360bbb-3(b)(1), unless the authorization is terminated or revoked sooner. Performed at [facility], [place] [address], [place], [place] [address]   MRSA PCR Screening     Status: None   Collection Time: 07/27/18  8:43 AM   Specimen: Nasopharyngeal  Result Value Ref Range Status   MRSA by PCR NEGATIVE NEGATIVE Final    Comment:        The GeneXpert MRSA Assay (FDA approved for NASAL specimens only), is one component of a comprehensive MRSA colonization surveillance program. It is not intended to diagnose MRSA infection nor to guide or monitor treatment for MRSA infections. Performed at [facility], [place] [address], [place], [place] [address]   Expectorated sputum assessment w rflx to resp cult     Status: None   Collection Time: 07/27/18  8:44 AM   Specimen: Expectorated Sputum  Result Value Ref Range Status   Specimen Description EXPECTORATED SPUTUM  Final   Special Requests NONE  Final   Sputum evaluation   Final    THIS SPECIMEN IS ACCEPTABLE FOR SPUTUM CULTURE Performed at [facility], [place] [address] [place], [place] [address]    Report Status 07/27/2018 FINAL  Final  Culture, respiratory     Status: None   Collection Time: 07/27/18  8:44 AM  Result Value Ref Range Status   Specimen Description   Final    EXPECTORATED SPUTUM Performed at [place] [address], [place],  [address]    Special Requests   Final    NONE  Reflexed from [phone] Performed at [place] [address], [place], [place] [address]    Gram Stain   Final    ABUNDANT SQUAMOUS EPITHELIAL CELLS PRESENT ABUNDANT WBC PRESENT, PREDOMINANTLY PMN ABUNDANT GRAM POSITIVE COCCI ABUNDANT GRAM NEGATIVE RODS MODERATE GRAM POSITIVE RODS    Culture   Final    Consistent with normal respiratory flora. Performed at [facility], [address] [place], [place] [address]    Report Status 07/30/2018 FINAL  Final  SARS Coronavirus 2 (CEPHEID - Performed in [place] hospital lab), Hosp Order     Status: None   Collection Time: 07/27/18  8:52 AM   Specimen: Nasopharyngeal Swab  Result Value Ref Range Status   SARS Coronavirus 2 NEGATIVE NEGATIVE Final    Comment: (NOTE) If result is NEGATIVE SARS-CoV-2 target nucleic acids are NOT DETECTED. The SARS-CoV-2 RNA is generally detectable in upper and lower  respiratory specimens during the acute phase of infection. The lowest  concentration of SARS-CoV-2 viral copies this assay can detect is 250  copies / mL. A negative result does not preclude SARS-CoV-2 infection  and should not be used as the sole basis for treatment or other  patient management decisions.  A negative result may occur with  improper specimen collection / handling, submission of specimen other  than nasopharyngeal swab, presence of viral mutation(s) within the  areas targeted by this assay, and inadequate number of viral copies  (<250 copies / mL). A negative result must be combined with clinical  observations, patient history, and epidemiological information. If result is POSITIVE SARS-CoV-2 target nucleic acids are DETECTED. The SARS-CoV-2 RNA is generally detectable in upper and lower  respiratory specimens dur ing the acute phase of infection.  Positive  results are indicative of active infection with SARS-CoV-2.  Clinical  correlation with patient history and other diagnostic information is    necessary to determine patient infection status.  Positive results do  not rule out bacterial infection or co-infection with other viruses. If result is PRESUMPTIVE POSTIVE SARS-CoV-2 nucleic acids MAY BE PRESENT.   A presumptive positive result was obtained on the submitted specimen  and confirmed on repeat testing.  While 2019 novel coronavirus  (SARS-CoV-2) nucleic acids may be present in the submitted sample  additional confirmatory testing may be necessary for epidemiological  and / or clinical management purposes  to differentiate between  SARS-CoV-2 and other Sarbecovirus currently known to infect humans.  If clinically indicated additional testing with an alternate test  methodology [phone]) is advised. The SARS-CoV-2 RNA is generally  detectable in upper and lower respiratory sp ecimens during the acute  phase of infection. The expected result is Negative. Fact Sheet for Patients:  StrictlyIdeas.no Fact Sheet for Healthcare Providers: BankingDealers.co.za This test is not yet approved or cleared by the [place] FDA and has been authorized for detection and/or diagnosis of SARS-CoV-2 by FDA under an Emergency Use Authorization (EUA).  This EUA will remain in effect (meaning this test can be used) for the duration of the COVID-19 declaration under Section 564(b)(1) of the Act, 21 U.S.C. section 360bbb-3(b)(1), unless the authorization is terminated or revoked sooner. Performed at [facility], [place] [address], [place], [place] [address]   Culture, Urine     Status: Abnormal   Collection Time: 07/29/18  8:21 AM   Specimen: Urine, Random  Result Value Ref Range Status   Specimen Description   Final    URINE, RANDOM Performed at [place] [address], [place], [place] [address]    Special Requests   Final    NONE Performed at [facility], [place] [address] [place], [place] [address]    Culture (A)  Final    <10,000 COLONIES/mL INSIGNIFICANT GROWTH Performed at [place] [address] [place], [place] [address]    Report  Status 07/30/2018 FINAL  Final  Culture, blood (routine x 2)     Status: None (Preliminary result)   Collection Time: 07/29/18  9:51 AM   Specimen: BLOOD  Result Value Ref Range Status   Specimen Description   Final    BLOOD LEFT ARM Performed at [place] [address], [place], [place] [address]    Special Requests   Final    BOTTLES DRAWN AEROBIC AND ANAEROBIC Blood Culture adequate volume Performed at [place] [address] [place], [place] [address]    Culture   Final    NO GROWTH 2 DAYS Performed at [place] [address], [place], [place] [address]    Report Status PENDING  Incomplete  Culture, blood (routine x 2)     Status: None (Preliminary result)   Collection Time: 07/29/18  9:53 AM   Specimen: BLOOD RIGHT HAND  Result Value Ref Range Status   Specimen Description   Final    BLOOD RIGHT HAND Performed at [place] [address], [place], [place] [address]    Special Requests   Final    BOTTLES DRAWN AEROBIC AND ANAEROBIC Blood Culture adequate volume Performed at [place] [address] [place], [place] [address]    Culture   Final    NO GROWTH 2 DAYS Performed at [place] [address], [place],  [address]    Report Status PENDING  Incomplete         Radiology Studies: Dg Chest Port 1 View  Result Date: 07/30/2018 CLINICAL DATA:  Shortness of breath, history GERD, lung cancer, hypertension, prostate cancer EXAM: PORTABLE CHEST 1 VIEW COMPARISON:  Portable exam 0548 hours compared to 07/29/2018 FINDINGS: Normal heart size, mediastinal contours and pulmonary vascularity. Persistent bibasilar opacities which may represent atelectasis, less likely  infiltrate. Persistent small RIGHT pleural effusion. Upper lungs clear. No pneumothorax. IMPRESSION: Persistent bibasilar opacities representing a small RIGHT pleural effusion and likely atelectasis at both lung bases. Electronically Signed   By: Lavonia Dana M.D.   On: 07/30/2018 09:08        Scheduled Meds:  apixaban  10 mg Oral BID   [START ON 08/03/2018] apixaban  5 mg Oral BID   aspirin EC  81 mg Oral Daily   atorvastatin  40 mg Oral Daily   doxycycline  100 mg Oral Q12H   feeding supplement (ENSURE ENLIVE)  237 mL Oral BID BM   levothyroxine  75 mcg Oral QAC breakfast   multivitamin with minerals  1 tablet Oral Daily   predniSONE  5 mg Oral Q breakfast   Continuous Infusions:    LOS: 7 days    Time spent: 40 mins    Irine Seal, MD [facility]  If 7PM-7AM, please contact night-coverage [email or web address] 07/31/2018, 12:11 PM

## 2018-08-01 LAB — CBC
HCT: 32.4 % — ABNORMAL LOW (ref 39.0–52.0)
Hemoglobin: 10.4 g/dL — ABNORMAL LOW (ref 13.0–17.0)
MCH: 31.9 pg (ref 26.0–34.0)
MCHC: 32.1 g/dL (ref 30.0–36.0)
MCV: 99.4 fL (ref 80.0–100.0)
Platelets: 242 10*3/uL (ref 150–400)
RBC: 3.26 MIL/uL — ABNORMAL LOW (ref 4.22–5.81)
RDW: 14.2 % (ref 11.5–15.5)
WBC: 12.2 10*3/uL — ABNORMAL HIGH (ref 4.0–10.5)
nRBC: 0 % (ref 0.0–0.2)

## 2018-08-01 LAB — BASIC METABOLIC PANEL
Anion gap: 9 (ref 5–15)
BUN: 20 mg/dL (ref 8–23)
CO2: 31 mmol/L (ref 22–32)
Calcium: 9 mg/dL (ref 8.9–10.3)
Chloride: 94 mmol/L — ABNORMAL LOW (ref 98–111)
Creatinine, Ser: 1.12 mg/dL (ref 0.61–1.24)
GFR calc Af Amer: >60 mL/min (ref >60)
GFR calc non Af Amer: >60 mL/min (ref >60)
Glucose, Bld: 103 mg/dL — ABNORMAL HIGH (ref 70–99)
Potassium: 4.2 mmol/L (ref 3.5–5.1)
Sodium: 134 mmol/L — ABNORMAL LOW (ref 135–145)

## 2018-08-01 MED ORDER — HYDROCODONE-ACETAMINOPHEN 5-325 MG PO TABS: 1.0000 | ORAL_TABLET | ORAL | 0 refills | Status: AC | PRN

## 2018-08-01 MED ORDER — ACETAMINOPHEN 325 MG PO TABS: 650.0000 mg | ORAL_TABLET | Freq: Four times a day (QID) | ORAL | Status: DC | PRN

## 2018-08-01 MED ORDER — ADULT MULTIVITAMIN W/MINERALS CH: 1.0000 | ORAL_TABLET | Freq: Every day | ORAL | Status: AC

## 2018-08-01 MED ORDER — APIXABAN 5 MG PO TABS: 5.0000 mg | ORAL_TABLET | Freq: Two times a day (BID) | ORAL | 0 refills | Status: AC

## 2018-08-01 MED ORDER — DOXYCYCLINE HYCLATE 100 MG PO TABS: 100.0000 mg | ORAL_TABLET | Freq: Two times a day (BID) | ORAL | 0 refills | Status: AC

## 2018-08-01 MED ORDER — POLYETHYLENE GLYCOL 3350 17 G PO PACK: 17.0000 g | PACK | Freq: Every day | ORAL | Status: AC | PRN

## 2018-08-01 MED ORDER — APIXABAN 5 MG PO TABS: 10.0000 mg | ORAL_TABLET | Freq: Two times a day (BID) | ORAL | 0 refills | Status: DC

## 2018-08-01 NOTE — Plan of Care (Signed)
  Problem: Clinical Measurements: Goal: Ability to maintain clinical measurements within normal limits will improve Outcome: Adequate for Discharge Goal: Will remain free from infection Outcome: Adequate for Discharge Goal: Diagnostic test results will improve Outcome: Adequate for Discharge Goal: Respiratory complications will improve Outcome: Adequate for Discharge Goal: Cardiovascular complication will be avoided Outcome: Adequate for Discharge   Problem: Activity: Goal: Risk for activity intolerance will decrease Outcome: Adequate for Discharge   Problem: Nutrition: Goal: Adequate nutrition will be maintained Outcome: Adequate for Discharge   Problem: Coping: Goal: Level of anxiety will decrease Outcome: Adequate for Discharge   Problem: Elimination: Goal: Will not experience complications related to bowel motility Outcome: Adequate for Discharge Goal: Will not experience complications related to urinary retention Outcome: Adequate for Discharge   Problem: Pain Managment: Goal: General experience of comfort will improve Outcome: Adequate for Discharge   Problem: Safety: Goal: Ability to remain free from injury will improve Outcome: Adequate for Discharge   Problem: Skin Integrity: Goal: Risk for impaired skin integrity will decrease Outcome: Adequate for Discharge   

## 2018-08-01 NOTE — Discharge Summary (Signed)
Physician Discharge Summary  Jeremy Chan ZHG:[id number] DOB: 1940/04/21 DOA: 07/23/2018  PCP: Jeremy Rushing, MD  Admit date: 07/23/2018 Discharge date: 08/01/2018  Time spent: 60 minutes  Recommendations for Outpatient Follow-up:  1. Follow-up with Jeremy Rushing, MD in 1 to 2 weeks.  On follow-up patient will need a basic metabolic profile done to follow-up on electrolytes and renal function.  Patient's blood pressure also need to be reassessed.  Follow-up on patient's pulmonary embolism. 2. Follow-up with Jeremy Chan, hematology/oncology at [facility] in 2 weeks or as previously scheduled.   Discharge Diagnoses:  Principal Problem:   Pulmonary embolism (HCC) Active Problems:   Hyperlipidemia   Essential hypertension   Renal cell cancer, right (HCC)   CAD (coronary artery disease)   Dehydration   AKI (acute kidney injury) ([place])   Hypothyroidism   Hypopituitarism ([place])   Fever   Discharge Condition: Stable and improved  Diet recommendation: Heart healthy  Filed Weights   07/31/18 0500 07/31/18 0502 08/01/18 0451  Weight: 91.2 kg 91.2 kg 89.8 kg    History of present illness:  Per Dr. Tyler Chan is a 78 y.o. 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 Servando Chan, Pan-hypopitutarism    Presented with SOB, worse than his baseline and worse with exertion he got suddenly very short of breath on the day of admission, when he was trying to clean off the urine on the floor when his dog had an accident.  When EMS arrived his oxygen saturation was down to 92% he was  placed on nonrebreather secondary to increased work of breathing although by that time his oxygen saturation was 95% room air He has had decreased appetite and fatigue which is been chronic since his diagnosis of cancer having had significant coughing no fevers no chest pain he denies any leg swelling   Recently seen by his oncologist CT 07/16/18 showed enlargement of a right renal neoplasm w/d interval extension into the R renal vein and IVC, enlargement of multiple necrotic mediastinal lymph node conglomerates, and increased nodularity chest and abdomen. Stable multiple sclerotic and lytic foci of osseous metastatic dise.  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   testosterone every 2 weeks Also on synthroid replacement and therapy     HTN on amLODIPine-benazepril       CAD  - On Aspirin, statin                              PMH of cardiac stents in early 2000s.  Hypothyroidism:  Recent Labs       Lab Results  Component Value Date   TSH 4.746 (H) 08/01/2016     on synthroid    While in ER:   Was found to have creatinine elevated at 1.39 WBC count of 13.5 CTA chest showed PE with possible right side strain at no time patient was hypotensive during ER stay  The following Work up has been ordered so far:  Orders Placed This Encounter  Procedures  . SARS Coronavirus 2  (CEPHEID - Performed in [place] hospital lab), [facility]  . DG Chest 2 View  . CT Angio Chest PE W and/or Wo Contrast  . Comprehensive metabolic panel  . CBC with Differential  . APTT  . Protime-INR  . Troponin I - Once  . CBC  . Brain natriuretic peptide  . Creatinine, urine, random  . Sodium, urine, random  . Heparin level (unfractionated)  . If O2 Sat <94% administer O2 at 2 liters/minute via nasal cannula  . Measure blood pressure  . Height and weight  . Cardiac monitoring  . Consult to hospitalist  ALL PATIENTS BEING ADMITTED/HAVING PROCEDURES NEED COVID-19 SCREENING  . heparin per pharmacy consult  . Consult to care management  . Droplet precaution  . Pulse oximetry, continuous  . EKG 12-Lead  . ED EKG  . Place in observation (patient's expected length of stay will be less than 2 midnights)    Following Medications were ordered in ER: Medications  sodium chloride (PF) 0.9 % injection (has no administration in time range)  heparin ADULT infusion 100 units/mL (25000 units/230mL sodium chloride 0.45%) (1,350 Units/hr Intravenous New Bag/Given 07/23/18 1859)  sodium chloride 0.9 % bolus 1,000 mL (0 mLs Intravenous Stopped 07/23/18 1544)  iohexol (OMNIPAQUE) 350 MG/ML injection 100 mL (100 mLs Intravenous Contrast Given 07/23/18 1714)  heparin bolus via infusion 2,400 Units (2,400 Units Intravenous Bolus from Bag 07/23/18 1859)       Hospital Course:  1 submassive PE Questionable etiology.  In the setting of hypercoagulable state secondary to malignancy that has metastasized to the lungs.  Cardiac enzymes which were cycled were negative.  CT angiogram chest done on admission showed acute PE in the right main pulmonary artery with extension to the right middle lobe and right lower lobe pulmonary arteries.  2D echo which was done was negative for right ventricular strain.  Lower extremity Dopplers were negative for DVT.  Patient initially placed on IV heparin for at least 48  hours and decision was made in discussions with patient's medical oncologist at [facility] long and at [facility] decision was made to transition patient to [place].  MRI of the brain which was done was negative for any brain mets.  Patient likely will require home oxygen on discharge.  Patient during the hospitalization had some complaints of worsening shortness of breath.  Patient was given doses of IV Lasix with good urine output and clinical improvement.  Patient was subsequently transitioned to Eliquis which patient tolerated.  Outpatient follow-up with hematology/oncology.  2.  Acute respiratory failure with hypoxia Secondary to submassive PE in the setting of metastatic disease.  Patient noted to also have spiked a low-grade fever and concern for possible bronchitis versus pneumonia in the setting of PE.  Patient currently remains for the rest of the hospitalization.  Patient started on doxycycline and was discharged home to complete a 7-day course of antibiotic treatment. Patient was seen in consultation by pulmonary.  Patient  will need home O2 on discharge.  Patient given few doses of IV Lasix with good urine output and clinical improvement.  Outpatient follow-up.   3.  Bronchitis versus pneumonia Patient was placed on doxycycline during the hospitalization due to concerns for bronchitis.  Leukocytosis trended down on antibiotic treatment.  Patient be discharged home to complete a 7-day course of antibiotic treatment.  Improved clinically.  Outpatient follow-up.   4.  Hypophosphatemia Repleted.  5.  Hyperlipidemia Patient was maintained on a statin.  6.  Hypertension Patient noted to have episodes of hypotension and as such home antihypertensive medications were held.  Blood pressure remained stable.  Outpatient follow-up.   7.  Metastatic right renal cell cancer/history of prostate cancer Being followed at [facility].  Renal cancer noted to have metastasized to  the lung, bone, pelvis as well as soft tissue.  Patient currently on nivolumab. Patient also with history of laryngeal cancer that involved bilateral vocal cord status post radiation and history of prostate cancer treated with brachytherapy implantations.  Patient currently to have a change in immunotherapy given failure of previous treatment and progression which has not started yet and will start in the outpatient setting.  CT angiogram chest which was done showed worsening metastatic disease.Dr Alfredia Ferguson spoke with Jeremy Chan at [facility] patient's primary oncologist was planning on starting new immunotherapy at discharge, Jeremy Chan concerned about possible metastatic lesions to the brain given worsening metastatic disease acutely MRI was recommended which was done which was negative for any metastatic disease.  Outpatient follow-up with oncology.  8.  Coronary artery disease status post stents Stable.  Patient maintained on aspirin and statin.  Outpatient follow-up.  9.  Dehydration Resolved with hydration.   10.  Acute kidney injury Felt likely to be prerenal in nature.  Patient was hydrated IV fluids which was subsequently discontinued as patient noted to have developed some pedal edema.  Nephrotoxic agents were avoided.  Patient received a few doses of IV Lasix with clinical improvement.  Creatinine was down to 1.12 by day of discharge.  Outpatient follow-up.   11.  Panhypopituitarism Patient maintained on home regimen of prednisone and Synthroid.  Outpatient follow-up with endocrinology.  12.  Microcytic anemia Stable.  Follow.  13.  Thrombocytopenia No signs of bleeding.  Platelet count improved and was 242 by day of discharge.  Outpatient follow-up.  14.  Failure to thrive/poor oral intake Patient maintained on nutritional supplementation throughout the hospitalization.  Outpatient follow-up.      Procedures:  CT angiogram chest 07/23/2018  Chest x-ray 07/23/2018,  07/25/2018, 07/26/2018, 07/28/2018, 07/29/2018, 07/30/2018  MRI brain 07/25/2018  2D echo 07/24/2018  Lower extremity Doppler 07/24/2018   Consultations:  PCCM: Dr Ander Slade 07/26/2018  Discharge Exam: Vitals:   07/31/18 2159 08/01/18 0451  BP: 113/71 119/67  Pulse: 81 81  Resp: 18 18  Temp: 97.9 F (36.6 C) 98.3 F (36.8 C)  SpO2: 97% 97%    General: NAD Cardiovascular: RRR Respiratory: CTAB  Discharge Instructions   Discharge Instructions    Diet - low sodium heart healthy   Complete by: As directed    Increase activity slowly   Complete by: As directed      Allergies as of 08/01/2018      Reactions   No Known Allergies       Medication List    STOP taking these medications   amLODipine-benazepril 5-20 MG capsule Commonly known as: LOTREL   bisacodyl 10 MG suppository  Commonly known as: DULCOLAX   ondansetron 4 MG tablet Commonly known as: ZOFRAN     TAKE these medications   acetaminophen 325 MG tablet Commonly known as: TYLENOL Take 2 tablets (650 mg total) by mouth every 6 (six) hours as needed for mild pain (or Fever >/= 101).   apixaban 5 MG Tabs tablet Commonly known as: ELIQUIS Take 2 tablets (10 mg total) by mouth 2 (two) times daily for 2 days.   apixaban 5 MG Tabs tablet Commonly known as: ELIQUIS Take 1 tablet (5 mg total) by mouth 2 (two) times daily. Start taking on: August 03, 2018   aspirin EC 81 MG tablet Take 81 mg by mouth daily.   atorvastatin 40 MG tablet Commonly known as: LIPITOR Take 40 mg by mouth daily.   doxycycline 100 MG tablet Commonly known as: VIBRA-TABS Take 1 tablet (100 mg total) by mouth every 12 (twelve) hours for 2 days.   Euthyrox 75 MCG tablet Generic drug: levothyroxine Take 75 mcg by mouth daily before breakfast.   HYDROcodone-acetaminophen 5-325 MG tablet Commonly known as: NORCO/VICODIN Take 1-2 tablets by mouth every 4 (four) hours as needed for moderate pain.   lactose free nutrition Liqd Take  237 mLs by mouth 2 (two) times daily as needed (nutrition).   multivitamin with minerals Tabs tablet Take 1 tablet by mouth daily. Start taking on: August 02, 2018   polyethylene glycol 17 g packet Commonly known as: MIRALAX / GLYCOLAX Take 17 g by mouth daily as needed (for constipation).   predniSONE 5 MG tablet Commonly known as: DELTASONE Take 5 mg by mouth daily with breakfast.            Durable Medical Equipment  (From admission, onward)         Start     Ordered   07/30/18 0917  For home use only DME oxygen  Once    Question Answer Comment  Length of Need 6 Months   Mode or (Route) Nasal cannula   Liters per Minute 2   Frequency Continuous (stationary and portable oxygen unit needed)   Oxygen conserving device Yes   Oxygen delivery system Gas      07/30/18 0916   07/29/18 0823  For home use only DME oxygen  Once    Question Answer Comment  Length of Need 6 Months   Mode or (Route) Nasal cannula   Frequency Continuous (stationary and portable oxygen unit needed)   Oxygen delivery system Gas      07/29/18 0822         Allergies  Allergen Reactions  . No Known Allergies    Follow-up Information    Primary Care physician. Schedule an appointment as soon as possible for a visit.        [facility] Follow up.   Why: Oxygen Contact information: [place] [address] [phone]        [facility] Follow up.   Specialty: [place] Why: [place] physical therapy,occupational therapy Contact information: [address] [place] [address] [phone]        Jeremy Rushing, MD. Schedule an appointment as soon as possible for a visit in 1 week(s).   Specialty: Internal Medicine Why: f/u in 1-2 weeks. Contact information: [place] [address] [phone]        Mariel Craft, MD. Schedule an appointment as soon as possible for a visit in 2 week(s).  Specialty:  Hematology and Oncology Why: f/u in 2 weeks or as scheduled. Contact information: [place] [address] [phone]            The results of significant diagnostics from this hospitalization (including imaging, microbiology, ancillary and laboratory) are listed below for reference.    Significant Diagnostic Studies: Dg Chest 2 View  Result Date: 07/23/2018 CLINICAL DATA:  History of lung cancer.  Shortness of breath. EXAM: CHEST - 2 VIEW COMPARISON:  December 18, 2016 FINDINGS: There is increased nodularity in the lateral left chest compared to the previous study. Left pleural fluid and underlying opacity seen in the base. The right lung is clear. The left hilum is more prominent the interval. Nodularity on the lateral view. No other changes. IMPRESSION: Nodularity on the lateral view, laterally in the left chest on the frontal view, and increased fullness in the left hilum. The findings are concerning for recurrence. Recommend CT imaging for better evaluation. Effusion and underlying opacity, likely atelectasis or scar, in the left base. Electronically Signed   By: Dorise Bullion III M.D   On: 07/23/2018 15:51   Ct Angio Chest Pe W And/or Wo Contrast  Result Date: 07/23/2018 CLINICAL DATA:  Shortness of breath. EXAM: CT ANGIOGRAPHY CHEST WITH CONTRAST TECHNIQUE: Multidetector CT imaging of the chest was performed using the standard protocol during bolus administration of intravenous contrast. Multiplanar CT image reconstructions and MIPs were obtained to evaluate the vascular anatomy. CONTRAST:  165mL OMNIPAQUE IOHEXOL 350 MG/ML SOLN COMPARISON:  CT chest dated August 02, 2016 FINDINGS: Cardiovascular: There is an acute pulmonary embolus involving the main right pulmonary artery. Emboli are seen extending into the right lower, right middle and right upper pulmonary arteries. Evaluation is somewhat limited by motion artifact. There is CT evidence of right heart strain  with an RV/LV ratio measuring approximately 1.3. There are coronary artery calcifications. The heart size is normal. There is no significant pericardial effusion. Mediastinum/Nodes: There are multiple pathologically enlarged mediastinal lymph nodes measuring up to approximately 3.7 cm (axial series 4, image 45). There are no pathologically enlarged axillary or supraclavicular lymph nodes. The thyroid gland is unremarkable. Lungs/Pleura: There is a trace left-sided pleural effusion. There is a left upper lobe subpleural pulmonary nodule measuring approximately 2 cm (axial series 6, image 73). There are additional scattered areas of pleural thickening involving the left upper lobe. There is atelectasis at the left lung base. There is significant elevation of the left hemidiaphragm. 2 Upper Abdomen: There is a very abnormal appearance of the partially visualized right kidney. There is a partially visualized presumed right renal mass measuring approximately 7.3 cm. There is a 3.7 cm mass centered within the left anterior hemidiaphragm (axial series 4, image 83). Musculoskeletal: No chest wall abnormality. No acute or significant osseous findings. Review of the MIP images confirms the above findings. IMPRESSION: 1. Positive for acute pulmonary embolus essentially within the main right pulmonary artery with extension into the right middle lobe and right lower lobe pulmonary arteries. Positive for acute PE with CT evidence of right heart strain (RV/LV Ratio = 1.3) consistent with at least submassive (intermediate risk) PE. The presence of right heart strain has been associated with an increased risk of morbidity and mortality. 2. Worsening metastatic disease as evidenced by multiple pathologically enlarged mediastinal lymph nodes as detailed above in addition to multiple enlarging subpleural pulmonary nodules on the left and a metastatic deposit that appears to be centered within or adjacent to the  left hemidiaphragm. 3.  Large partially visualized right renal mass. 4. Elevation of the left hemidiaphragm. There is a trace left-sided pleural effusion with adjacent atelectasis. These results were called by telephone at the time of interpretation on 07/23/2018 at 5:46 pm to Dr. Davonna Belling , who verbally acknowledged these results. Electronically Signed   By: Constance Holster M.D.   On: 07/23/2018 17:48   Mr Brain Wo Contrast  Result Date: 07/25/2018 CLINICAL DATA:  Chronic headache.  Cancer. EXAM: MRI HEAD WITHOUT CONTRAST TECHNIQUE: Multiplanar, multiecho pulse sequences of the brain and surrounding structures were obtained without intravenous contrast. COMPARISON:  08/29/2016. FINDINGS: Brain: No evidence for acute infarction, hemorrhage, mass lesion, hydrocephalus, or extra-axial fluid. Mild atrophy. Mild subcortical and periventricular T2 and FLAIR hyperintensities, likely chronic microvascular ischemic change. Vascular: Flow voids are maintained throughout the carotid, basilar, and vertebral arteries. There are no areas of chronic hemorrhage. Skull and upper cervical spine: Mild tonsillar ectopia. No frank Chiari malformation. No cervical hydromyelia Sinuses/Orbits: No acute findings. Other: LEFT mastoid effusion. Compared with priors, similar appearance. IMPRESSION: Within limits for detection on noncontrast exam, no intracranial metastatic disease. Atrophy with chronic microvascular ischemic change. Slight tonsillar ectopia. No intracranial mass lesion or acute stroke. Electronically Signed   By: Staci Righter M.D.   On: 07/25/2018 20:47   Dg Chest Port 1 View  Result Date: 07/30/2018 CLINICAL DATA:  Shortness of breath, history GERD, lung cancer, hypertension, prostate cancer EXAM: PORTABLE CHEST 1 VIEW COMPARISON:  Portable exam 0548 hours compared to 07/29/2018 FINDINGS: Normal heart size, mediastinal contours and pulmonary vascularity. Persistent bibasilar opacities which may represent atelectasis, less likely  infiltrate. Persistent small RIGHT pleural effusion. Upper lungs clear. No pneumothorax. IMPRESSION: Persistent bibasilar opacities representing a small RIGHT pleural effusion and likely atelectasis at both lung bases. Electronically Signed   By: Lavonia Dana M.D.   On: 07/30/2018 09:08   Dg Chest Port 1 View  Result Date: 07/29/2018 CLINICAL DATA:  Shortness of breath. EXAM: PORTABLE CHEST 1 VIEW COMPARISON:  Radiograph of July 28, 2018. FINDINGS: Stable cardiomegaly. No pneumothorax is noted. Stable elevated left hemidiaphragm is noted. Mild left basilar atelectasis or infiltrate is noted. Mild right basilar atelectasis or infiltrate is noted with small right pleural effusion. Bony thorax is unremarkable. IMPRESSION: Stable elevated left hemidiaphragm with mild left basilar subsegmental atelectasis or infiltrate. Mild right basilar atelectasis or infiltrate is noted with small right pleural effusion. Electronically Signed   By: Marijo Conception M.D.   On: 07/29/2018 08:42   Dg Chest Port 1 View  Result Date: 07/28/2018 CLINICAL DATA:  Shortness of breath. EXAM: PORTABLE CHEST 1 VIEW COMPARISON:  Radiograph of July 26, 2018. FINDINGS: Stable cardiomegaly. No pneumothorax is noted. Elevated left hemidiaphragm is noted with stable left basilar atelectasis or infiltrate. Minimal right pleural effusion is noted with associated atelectasis or infiltrate. Bony thorax is unremarkable. IMPRESSION: Stable bilateral lung opacities as described above. Electronically Signed   By: Marijo Conception M.D.   On: 07/28/2018 07:42   Dg Chest Port 1 View  Result Date: 07/26/2018 CLINICAL DATA:  Weakness and shortness of breath. EXAM: PORTABLE CHEST 1 VIEW COMPARISON:  Radiograph earlier this day at 0456 hour, chest CT 07/23/2018 FINDINGS: Unchanged heart size and mediastinal contours. Unchanged elevation of left hemidiaphragm. Slight progression of left lung base opacity from earlier today. Increased right infrahilar opacity.  Peripheral nodular density in the left lower lung corresponding to pulmonary metastasis. Unchanged heart size and mediastinal contours. Left  hilar rounded density representing metastatic adenopathy on CT. No pulmonary edema. No pneumothorax. IMPRESSION: 1. Slight progression in left lung base opacity from earlier today, which may be due to increasing pleural effusion, atelectasis, or airspace disease/pneumonia 2. Increased right infrahilar opacity favoring atelectasis. However recommend correlation for aspiration risk factors. Electronically Signed   By: Keith Rake M.D.   On: 07/26/2018 20:34   Dg Chest Port 1 View  Result Date: 07/26/2018 CLINICAL DATA:  Dyspnea EXAM: PORTABLE CHEST 1 VIEW COMPARISON:  Chest radiograph from one day prior. FINDINGS: Stable cardiomediastinal silhouette with normal heart size and enlargement of the AP window contour. No pneumothorax. No pleural effusion. Stable elevation of the left hemidiaphragm. No pulmonary edema. Stable nodular peripheral left midlung opacity and left lung base atelectasis. IMPRESSION: 1. Stable AP window fullness compatible with known metastatic adenopathy. 2. Stable nodular peripheral left mid lung opacity compatible with pulmonary metastasis seen on recent chest CT. 3. Stable left lung base atelectasis with elevation of the left hemidiaphragm. Electronically Signed   By: Ilona Sorrel M.D.   On: 07/26/2018 07:11   Dg Chest Port 1 View  Result Date: 07/25/2018 CLINICAL DATA:  Shortness of breath. EXAM: PORTABLE CHEST 1 VIEW COMPARISON:  CT 07/23/2018.  Chest x-ray 07/23/2018. FINDINGS: Persistent mediastinal and left hilar fullness consistent with known adenopathy. Persistent left mid lung field pleural-based density again noted. Left base atelectasis/infiltrate and left-sided pleural effusion again noted. Degenerative change thoracic spine. IMPRESSION: 1. Persistent mediastinal fullness and left hilar fullness consistent known adenopathy.  Persistent left mid lung pleural-based density again noted. 2. Left base atelectasis/infiltrate and left-sided pleural effusion again noted. Similar findings noted on prior exam. Electronically Signed   By: Jeremy Chan  Register   On: 07/25/2018 07:14   Vas [place] Lower Extremity Venous (dvt)  Result Date: 07/24/2018  Lower Venous Study Indications: Pulmonary embolism.  Risk Factors: Confirmed PE Cancer metastatic cancer-renal, lung, bone, pelvis, soft tissue. Comparison Study: No prior study on file for comparison Performing Technologist: Sharion Dove RVS  Examination Guidelines: A complete evaluation includes B-mode imaging, spectral Doppler, color Doppler, and power Doppler as needed of all accessible portions of each vessel. Bilateral testing is considered an integral part of a complete examination. Limited examinations for reoccurring indications may be performed as noted.  +---------+---------------+---------+-----------+----------+-------+ RIGHT    CompressibilityPhasicitySpontaneityPropertiesSummary +---------+---------------+---------+-----------+----------+-------+ CFV      Full           Yes      Yes                          +---------+---------------+---------+-----------+----------+-------+ SFJ      Full                                                 +---------+---------------+---------+-----------+----------+-------+ FV Prox  Full                                                 +---------+---------------+---------+-----------+----------+-------+ FV Mid   Full                                                 +---------+---------------+---------+-----------+----------+-------+  FV DistalFull                                                 +---------+---------------+---------+-----------+----------+-------+ PFV      Full                                                 +---------+---------------+---------+-----------+----------+-------+ POP      Full            Yes      Yes                          +---------+---------------+---------+-----------+----------+-------+ PTV      Full                                                 +---------+---------------+---------+-----------+----------+-------+ PERO     Full                                                 +---------+---------------+---------+-----------+----------+-------+   +---------+---------------+---------+-----------+----------+-------------------+ LEFT     CompressibilityPhasicitySpontaneityPropertiesSummary             +---------+---------------+---------+-----------+----------+-------------------+ CFV      Full           Yes      Yes                                      +---------+---------------+---------+-----------+----------+-------------------+ SFJ      Full                                                             +---------+---------------+---------+-----------+----------+-------------------+ FV Prox  Full                                                             +---------+---------------+---------+-----------+----------+-------------------+ FV Mid   Full                                                             +---------+---------------+---------+-----------+----------+-------------------+ FV DistalFull                                                             +---------+---------------+---------+-----------+----------+-------------------+  PFV      Full                                                             +---------+---------------+---------+-----------+----------+-------------------+ POP      Full           Yes      Yes                  sluggish flow noted +---------+---------------+---------+-----------+----------+-------------------+ PTV      Full                                                             +---------+---------------+---------+-----------+----------+-------------------+ PERO     Full                                                              +---------+---------------+---------+-----------+----------+-------------------+     Summary: Right: There is no evidence of deep vein thrombosis in the lower extremity. Left: There is no evidence of deep vein thrombosis in the lower extremity.  *See table(s) above for measurements and observations. Electronically signed by Jeremy Snare MD on 07/24/2018 at 4:11:08 PM.    Final     Microbiology: Recent Results (from the past 240 hour(s))  SARS Coronavirus 2 (CEPHEID - Performed in [facility] hospital lab), Hosp Order     Status: None   Collection Time: 07/23/18  6:57 PM   Specimen: Nasopharyngeal Swab  Result Value Ref Range Status   SARS Coronavirus 2 NEGATIVE NEGATIVE Final    Comment: (NOTE) If result is NEGATIVE SARS-CoV-2 target nucleic acids are NOT DETECTED. The SARS-CoV-2 RNA is generally detectable in upper and lower  respiratory specimens during the acute phase of infection. The lowest  concentration of SARS-CoV-2 viral copies this assay can detect is 250  copies / mL. A negative result does not preclude SARS-CoV-2 infection  and should not be used as the sole basis for treatment or other  patient management decisions.  A negative result may occur with  improper specimen collection / handling, submission of specimen other  than nasopharyngeal swab, presence of viral mutation(s) within the  areas targeted by this assay, and inadequate number of viral copies  (<250 copies / mL). A negative result must be combined with clinical  observations, patient history, and epidemiological information. If result is POSITIVE SARS-CoV-2 target nucleic acids are DETECTED. The SARS-CoV-2 RNA is generally detectable in upper and lower  respiratory specimens dur ing the acute phase of infection.  Positive  results are indicative of active infection with SARS-CoV-2.  Clinical  correlation with patient history and other diagnostic  information is  necessary to determine patient infection status.  Positive results do  not rule out bacterial infection or co-infection with other viruses. If result is PRESUMPTIVE POSTIVE SARS-CoV-2 nucleic acids MAY BE PRESENT.   A presumptive positive result was obtained on the submitted specimen  and confirmed on repeat testing.  While 2019 novel coronavirus  (SARS-CoV-2) nucleic acids may be present in the submitted sample  additional confirmatory testing may be necessary for epidemiological  and / or clinical management purposes  to differentiate between  SARS-CoV-2 and other Sarbecovirus currently known to infect humans.  If clinically indicated additional testing with an alternate test  methodology [id number]) is advised. The SARS-CoV-2 RNA is generally  detectable in upper and lower respiratory sp ecimens during the acute  phase of infection. The expected result is Negative. Fact Sheet for Patients:  StrictlyIdeas.no Fact Sheet for Healthcare Providers: BankingDealers.co.za This test is not yet approved or cleared by the [place] FDA and has been authorized for detection and/or diagnosis of SARS-CoV-2 by FDA under an Emergency Use Authorization (EUA).  This EUA will remain in effect (meaning this test can be used) for the duration of the COVID-19 declaration under Section 564(b)(1) of the Act, 21 U.S.C. section 360bbb-3(b)(1), unless the authorization is terminated or revoked sooner. Performed at [facility], [place] [address], [place], [place] [address]   MRSA PCR Screening     Status: None   Collection Time: 07/27/18  8:43 AM   Specimen: Nasopharyngeal  Result Value Ref Range Status   MRSA by PCR NEGATIVE NEGATIVE Final    Comment:        The GeneXpert MRSA Assay (FDA approved for NASAL specimens only), is one component of a comprehensive MRSA colonization surveillance program. It is not intended to  diagnose MRSA infection nor to guide or monitor treatment for MRSA infections. Performed at [facility], [place] [address], [place], [place] [address]   Expectorated sputum assessment w rflx to resp cult     Status: None   Collection Time: 07/27/18  8:44 AM   Specimen: Expectorated Sputum  Result Value Ref Range Status   Specimen Description EXPECTORATED SPUTUM  Final   Special Requests NONE  Final   Sputum evaluation   Final    THIS SPECIMEN IS ACCEPTABLE FOR SPUTUM CULTURE Performed at [facility], [place] [address] [place], [place] [address]    Report Status 07/27/2018 FINAL  Final  Culture, respiratory     Status: None   Collection Time: 07/27/18  8:44 AM  Result Value Ref Range Status   Specimen Description   Final    EXPECTORATED SPUTUM Performed at [place] [address], [place], [place] [address]    Special Requests   Final    NONE Reflexed from [id number] Performed at [facility] [address] [place], [place] [address]    Gram Stain   Final    ABUNDANT SQUAMOUS EPITHELIAL CELLS PRESENT ABUNDANT WBC PRESENT, PREDOMINANTLY PMN ABUNDANT GRAM POSITIVE COCCI ABUNDANT GRAM NEGATIVE RODS MODERATE GRAM POSITIVE RODS    Culture   Final    Consistent with normal respiratory flora. Performed at [facility], [place] [address] [place], [place] [address]    Report Status 07/30/2018 FINAL  Final  SARS Coronavirus 2 (CEPHEID - Performed in [facility] hospital lab), Hosp Order     Status: None   Collection Time: 07/27/18  8:52 AM   Specimen: Nasopharyngeal Swab  Result Value Ref Range Status   SARS Coronavirus 2 NEGATIVE NEGATIVE Final    Comment: (NOTE) If result is NEGATIVE SARS-CoV-2 target nucleic acids are NOT DETECTED. The SARS-CoV-2 RNA is generally detectable in upper and lower  respiratory specimens during the acute phase  of infection. The lowest  concentration of SARS-CoV-2 viral  copies this assay can detect is 250  copies / mL. A negative result does not preclude SARS-CoV-2 infection  and should not be used as the sole basis for treatment or other  patient management decisions.  A negative result may occur with  improper specimen collection / handling, submission of specimen other  than nasopharyngeal swab, presence of viral mutation(s) within the  areas targeted by this assay, and inadequate number of viral copies  (<250 copies / mL). A negative result must be combined with clinical  observations, patient history, and epidemiological information. If result is POSITIVE SARS-CoV-2 target nucleic acids are DETECTED. The SARS-CoV-2 RNA is generally detectable in upper and lower  respiratory specimens dur ing the acute phase of infection.  Positive  results are indicative of active infection with SARS-CoV-2.  Clinical  correlation with patient history and other diagnostic information is  necessary to determine patient infection status.  Positive results do  not rule out bacterial infection or co-infection with other viruses. If result is PRESUMPTIVE POSTIVE SARS-CoV-2 nucleic acids MAY BE PRESENT.   A presumptive positive result was obtained on the submitted specimen  and confirmed on repeat testing.  While 2019 novel coronavirus  (SARS-CoV-2) nucleic acids may be present in the submitted sample  additional confirmatory testing may be necessary for epidemiological  and / or clinical management purposes  to differentiate between  SARS-CoV-2 and other Sarbecovirus currently known to infect humans.  If clinically indicated additional testing with an alternate test  methodology [phone]) is advised. The SARS-CoV-2 RNA is generally  detectable in upper and lower respiratory sp ecimens during the acute  phase of infection. The expected result is Negative. Fact Sheet for Patients:  StrictlyIdeas.no Fact Sheet for Healthcare  Providers: BankingDealers.co.za This test is not yet approved or cleared by the [place] FDA and has been authorized for detection and/or diagnosis of SARS-CoV-2 by FDA under an Emergency Use Authorization (EUA).  This EUA will remain in effect (meaning this test can be used) for the duration of the COVID-19 declaration under Section 564(b)(1) of the Act, 21 U.S.C. section 360bbb-3(b)(1), unless the authorization is terminated or revoked sooner. Performed at [facility], [place] [address], [place], [place] [address]   Culture, Urine     Status: Abnormal   Collection Time: 07/29/18  8:21 AM   Specimen: Urine, Random  Result Value Ref Range Status   Specimen Description   Final    URINE, RANDOM Performed at [place] [address] [place], [place] [address]    Special Requests   Final    NONE Performed at [facility], [place] [address], [place], [place] [address]    Culture (A)  Final    <10,000 COLONIES/mL INSIGNIFICANT GROWTH Performed at [place] [address], [place], [place] [address]    Report Status 07/30/2018 FINAL  Final  Culture, blood (routine x 2)     Status: None (Preliminary result)   Collection Time: 07/29/18  9:51 AM   Specimen: BLOOD  Result Value Ref Range Status   Specimen Description   Final    BLOOD LEFT ARM Performed at [facility] [address] [place], [place] [address]    Special Requests   Final    BOTTLES DRAWN AEROBIC AND ANAEROBIC Blood Culture adequate volume Performed at [place] [address], [place],  [address]  Culture   Final    NO GROWTH 3 DAYS Performed at [facility], [place] [address], [place], [place] [address]    Report Status PENDING  Incomplete  Culture, blood (routine x 2)     Status: None (Preliminary result)   Collection Time: 07/29/18  9:53 AM   Specimen: BLOOD RIGHT  HAND  Result Value Ref Range Status   Specimen Description   Final    BLOOD RIGHT HAND Performed at [facility] [address] [place], [place] [address]    Special Requests   Final    BOTTLES DRAWN AEROBIC AND ANAEROBIC Blood Culture adequate volume Performed at [place] [address] [place],  [address]    Culture   Final    NO GROWTH 3 DAYS Performed at [facility], [place] [address], [place],  [address]    Report Status PENDING  Incomplete     Labs: Basic Metabolic Panel: Recent Labs  Lab 07/26/18 0525 07/27/18 0503 07/28/18 0451 07/29/18 8325 07/30/18 0521 07/31/18 0520 08/01/18 0509  NA 134* 130* 132* 133* 133* 133* 134*  K 4.0 4.6 4.0 3.7 3.8 4.0 4.2  CL 102 99 101 101 100 98 94*  CO2 20* 22 21* 23 24 26 31   GLUCOSE 107* 102* 104* 92 100* 93 103*  BUN 19 21 20 17 16 17 20   CREATININE 1.27* 1.22 1.17 1.12 1.07 1.05 1.12  CALCIUM 8.5* 7.8* 7.9* 8.0* 8.5* 8.6* 9.0  MG 2.0 2.1 2.0 2.1 2.1  --   --   PHOS 2.5 2.4* 2.2* 2.2* 2.3* 3.4  --    Liver Function Tests: Recent Labs  Lab 07/26/18 0525 07/27/18 0503 07/28/18 0451 07/29/18 0619 07/30/18 0521  AST 12* 10* 11* 17 28  ALT 11 8 10 15 24   ALKPHOS 58 53 59 73 100  BILITOT 1.1 0.9 0.3 0.4 0.4  PROT 6.7 6.1* 6.0* 6.2* 6.5  ALBUMIN 3.3* 3.0* 2.8* 2.7* 2.7*   No results for input(s): LIPASE, AMYLASE in the last 168 hours. No results for input(s): AMMONIA in the last 168 hours. CBC: Recent Labs  Lab 07/26/18 0525 07/27/18 0503 07/28/18 0451 07/29/18 0619 07/30/18 0521 08/01/18 0509  WBC 16.4* 17.1* 13.8* 10.9* 11.6* 12.2*  NEUTROABS 12.8* 13.7* 11.2* 8.5* 9.0*  --   HGB 11.1* 10.2* 9.8* 10.4* 10.6* 10.4*  HCT 34.8* 32.4* 30.5* 32.6* 33.5* 32.4*  MCV 101.2* 102.5* 101.0* 100.9* 100.6* 99.4  PLT 145* 148* 152 158 188 242   Cardiac Enzymes: No results for input(s): CKTOTAL, CKMB, CKMBINDEX, TROPONINI in the last 168 hours. BNP: BNP  (last 3 results) Recent Labs    07/23/18 1934  BNP 31.6    ProBNP (last 3 results) No results for input(s): PROBNP in the last 8760 hours.  CBG: No results for input(s): GLUCAP in the last 168 hours.     Signed:  Irine Seal MD.  [facility] 08/01/2018, 1:12 PM

## 2018-08-03 LAB — CULTURE, BLOOD (ROUTINE X 2)
Culture: NO GROWTH
Culture: NO GROWTH
Special Requests: ADEQUATE
Special Requests: ADEQUATE

## 2018-08-04 DIAGNOSIS — J40 Bronchitis, not specified as acute or chronic: Secondary | ICD-10-CM

## 2018-11-20 ENCOUNTER — Telehealth: Payer: Self-pay | Admitting: Internal Medicine

## 2018-11-20 NOTE — Telephone Encounter (Signed)
Spoke to patient's daughter [name] regarding palliative services and she was in agreement with this. I have scheduled a zoom palliative consult for 11/21/18 at 2:00PM.

## 2018-11-21 ENCOUNTER — Encounter: Payer: Self-pay | Admitting: Internal Medicine

## 2018-11-21 ENCOUNTER — Other Ambulatory Visit: Payer: Self-pay

## 2018-11-21 ENCOUNTER — Other Ambulatory Visit: Payer: Medicare Other | Admitting: Internal Medicine

## 2018-11-21 DIAGNOSIS — Z515 Encounter for palliative care: Secondary | ICD-10-CM

## 2018-11-21 NOTE — Progress Notes (Addendum)
Oct 16th, 2020 [facility] Palliative Care Consult Note Telephone: [phone]  Fax: [phone]   PATIENT NAME: Jeremy Chan DOB: January 03, 1941 MRN: [id number] [place] [id number] [email or web address] Phone: [phone] (M), [phone] [facility] outpt [phone] FAX: [phone]  PRIMARY CARE PROVIDER:   Haze Rushing, MD  REFERRING PROVIDER:  Dr. Mariel Chan (11/14/2018) Hematology/Oncology (GU) Dr. Cordelia Chan (Rad Onc) Dr. Soundra Chan (Endocrinology)  RESPONSIBLE PARTY: *(dtr) Jeremy Chan of [facility]) [phone] [facility]), [phone] (H), (son) Jeremy Chan [phone]. (son) Jeremy Chan [facility]   ASSESSMENT / RECOMMENDATIONS:  1. Advance Care Planning: A. Directives: Wishes full resuscitative efforts. Patient's HCPOA daughter Jeremy Chan shared, however, family's wish to revisit this choice as patient's condition declines. B. Goals of Care: Patient states he is not afraid of dying. Family note patient's LDH levels mirror the degree of cancer progression, and that recently the levels have been markedly elevated.  -To gain relief from symptoms of dyspnea, pain, nausea, and constipation  -to get his affairs in order -I did discuss Hospice; eligibility criteria and its scope of services. I mentioned that, should patient decide should patient decide to forgo any further aggressive treatment, that he would be eligible for services. Patient's daughter has some familularity with hospice as her ex-husband was under hospice care.  2. Symptom Management: A. Dyspnea: Can desaturate as low as mid 80's on O2 4L, when ambulating, with associated marked SOB. with amb.  -Dial liter flow up to 5-6 L with activity (to maintain sats mid 90's), then back to 4 at rest. -moisten face with cool water and fan can help ease symptoms of dyspnea B. Nausea: continuous but no emesis. Little impact with Zofran;   -I sent in script  for Compazine 10mg  q 6-8 hr prn. Hopefully will improve with treatment of constipation. C. Constipation. No BM for 12 days, then a few days ago small amount. Family has tried Miralax, and Fleets enema.  -try Mg citrate  bottle bid. Soap suds enema (daughter and son have some experience). Will need to continue maintenance dose senna 1-3 tabs qd to bid d/t opioid induced constipation D. Pain: Bilateral posterior lower rib, Improvement with 2 tabs Narco 5-325 qid, but break through pain(severe) before next scheduled dose.  Sometimes awakens with nocturnal pain. Thus family have been awakening him up during the night to give him scheduled dose.    -Start MS Cont 15 mg bid with continuation of Narco 5-325 prn break through pain. I discussed with daughter Jeremy Chan to just give 1 tab rather than 2 tabs of the Narco. E. Symptoms of GERD (no previous h/o) thought exacerbated by constipation. Daughter gave him a Nexium without improvement. Has tired Tums tablets, again without relief. Family defers further treatment pnd resolution of constipation  3. Cognitive / Functional status: Weight 182lbs. At a height of 5'7" his BMI is 28.5kg/m2. Poor oral intake last few weeks thought r/t constipation.  Family reports decline in strength/increased fatigue, increased dyspnea over these last few weeks, attributed to known pulmonary emboli. Patient can ambulate to BR and back but gets very SOB. [facility] was coming in for strength training but has not seen patient for the last 2 weeks. Dependent for assist with transfers, dressing, and hygiene. Within the last few weeks, notes LE swelling,  dry skin, dry mouth, and facial flushing, all thought r/t chemo (Afinitor) side effect.     4. Family Supports: Patient has been married for 28 yrs.  His wife has suffered many years with Multiple Sclerosis but has been doing remarkedly well; still able to ambulate with walker. Very supportive children: 2 sons Jeremy Chan who is Special educational needs teacher and Jeremy Chan who is  visiting from [place] with his family) and 1 daughter Jeremy Chan Chan). Jeremy Chan has taken FMLA from her job as a Arts development officer, through to the end of august. 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  Son Jeremy Chan wondering about online cancer support group that patient might tap into. I referred him to [place] for suggestions. I mentioned that our PC SW could offer some moral support and counseling; they will consider this.   5. Follow up Palliative Care Visit: 11/27/18 @ 3pm. Monitor for symptom improvement.  I spent 90 minutes providing this consultation from 3pm to 4:30pm. More than 50% of the time in this consultation was spent coordinating communication.   HISTORY OF PRESENT ILLNESS:  Jeremy Chan is an 78 y.o. 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 Palliative Care was asked to help address goals of care; specifically, to discuss full hospice vs palliative care/symptom management.   CODE STATUS: Full Code  PPS: weak 40%  HOSPICE ELIGIBILITY/DIAGNOSIS: TBD  PAST MEDICAL HISTORY:  Past Medical History:  Diagnosis Date  . 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 Age-related bone loss   . Erectile dysfunction   . GERD (gastroesophageal reflux disease)   . Hyperkalemia   . Hyperlipidemia   . Hypertension   . Other dietary vitamin B12 deficiency anemia   . Prostate cancer ([place])   . Urinary tract infection   . Vitamin D deficiency     SOCIAL HX:  Social History   Tobacco Use  . Smoking status: Former Smoker    Types: Cigarettes    Quit date: 1986    Years since quitting: 34.8  . Smokeless tobacco: Never Used  Substance Use Topics  . Alcohol use: Yes    ALLERGIES:   Allergies  Allergen Reactions  . No Known Allergies      PERTINENT MEDICATIONS:  Outpatient Encounter Medications as of 11/21/2018  Medication Sig  . acetaminophen (TYLENOL) 325 MG tablet Take 2 tablets (650 mg total) by mouth every 6 (six) hours as needed for mild pain (or Fever >/= 101).  Marland Kitchen apixaban (ELIQUIS) 5 MG TABS tablet Take 2 tablets (10 mg total) by mouth 2 (two) times daily for 2 days.  Marland Kitchen apixaban (ELIQUIS) 5 MG TABS tablet Take 1 tablet (5 mg total) by mouth 2 (two) times daily.  Marland Kitchen aspirin EC 81 MG tablet Take 81 mg by mouth daily.  Marland Kitchen atorvastatin (LIPITOR) 40 MG tablet Take 40 mg by mouth daily.  Arna Medici 75 MCG tablet Take 75 mcg by mouth daily before breakfast.   . HYDROcodone-acetaminophen (NORCO/VICODIN) 5-325 MG tablet Take 1-2 tablets by mouth every 4 (four) hours as needed for moderate pain.  Marland Kitchen lactose free nutrition (BOOST) LIQD Take 237 mLs by mouth 2 (two) times daily as needed (nutrition).   . Multiple Vitamin (MULTIVITAMIN WITH MINERALS) TABS tablet Take 1 tablet by mouth daily.  . polyethylene glycol (MIRALAX / GLYCOLAX) 17 g packet Take 17 g by mouth daily as needed (for constipation).  . predniSONE (DELTASONE) 5 MG tablet Take 5 mg by mouth daily with breakfast.  No facility-administered encounter medications on file as of 11/21/2018.     PHYSICAL EXAM:  Ill appearing, elderly gentleman lying supine in his recliner. Mildly SOB with conversation. Lucid and conversant.  His wife, 3 children and 1 young grandson are in attendance.  PE abbreviated to limit potential potential COVID exposure. LE mild swelling to mid calfs.  No rashes on exposed skin.   Julianne Handler, NP

## 2018-11-23 ENCOUNTER — Encounter: Payer: Self-pay | Admitting: Internal Medicine

## 2018-11-23 ENCOUNTER — Other Ambulatory Visit: Payer: Medicare Other | Admitting: Internal Medicine

## 2018-11-23 DIAGNOSIS — Z515 Encounter for palliative care: Secondary | ICD-10-CM

## 2018-11-23 NOTE — Progress Notes (Signed)
Oct 16th, 2020 [facility] Palliative Care Consult Note Telephone: [phone]  Fax: [phone]   PATIENT NAME: Jeremy Chan DOB: [birth year] MRN: [id number] [place] [id number] [email or web address] Phone: [phone] (M), [phone] [facility] outpt [phone] FAX: [phone]  PRIMARY CARE PROVIDER:   Haze Rushing, MD  REFERRING PROVIDER:  Dr. Mariel Craft (11/14/2018) Hematology/Oncology (GU) Dr. Cordelia Pen (Rad Onc) Dr. Soundra Pilon (Endocrinology)  RESPONSIBLE PARTY: *(dtr) [name] of [facility]) [phone] [facility]), [phone] (H), (son) [name] [phone]. (son) [name] [facility]  ASSESSMENT / RECOMMENDATIONS:   Interval history: TC from daughter [name], regarding change in patient status. Patient with increased confusion and somnolence. Having difficulty swallowing pain medication, and with nausea. I stopped by the home. Patient able to answer simple questions and self-transfer lying to sitting position. He was able to have a BM yesterday after a dose of MgCitrate.   1. Advance Care Planning: A. Directives: Consultation with 3 children present. Patient now with confusion and somnolence. Three children (including dtr [name] who is patient's HCPOA) and patient's wife desire DNR.  I reviewed, completed, and uploaded into [facility] EMR the DNR form, and left the original in the home. B. Goals of Care: Comfort, with relief from symptoms of dyspnea, pain, nausea, and constipation as is possible  -Home death. Will refer to [facility]; anticipate first visit availability tomorrow. Reviewed with family that, in the event of patient death (if hospice services not yet established) they are to call EMR to pronounce. EMR would subsequently call the funeral home ([employer] chosen by family) and the office of the PCP to notify of death.   -I reviewed symptom EOL with patient's 3 children. -I  provided [name] with my contact information; she will call me for changes in patient condition, or for any questions/concerns.  2. Symptom Management: A. Dyspnea: Maintain Oxygen via [place] 4L B. Nausea:    -prn Compazine 10mg  q 6-8 hr; can give tab PR as well as PO.  C. Constipation. Continue Senna BID D. Pain: patient having increased difficulty with swallowing pills.  C/O low back pain and general malaise.  I e-prescribed to [facility] (fax [phone]) script for Roxonal 20mg /ml. 5mg  (0.78ml) po q 4h prn pain/agitation. I reviewed with daughter [name] how to draw up with syringe. She is in health care and has familiarity with drawing up medications. I did caution her about the very concentrated  nature of this medication. -Start MS Cont 15 mg bid with continuation of Narco 5-325 prn break through pain. I discussed with daughter Angi to just give 1 tab rather than 2 tabs of the Narco.  3. Cognitive / Functional status: Increased somnolence and confusion. A & O to self only.  4. Family Supports: All 3 children and wife in the home.   5. Follow up: Hospice Referral; anticipate establish tomorrow Oct 19th.  I spent 60 minutes providing this consultation from 9:30am to 10:30am. More than 50% of the time in this consultation was spent coordinating communication.   HISTORY OF PRESENT ILLNESS:  Jeremy Chan is an 78 y.o. 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 Palliative Care was asked to help address goals of care; specifically, to discuss full hospice vs palliative care/symptom management.   CODE STATUS: DNR  PPS: weak 30%  HOSPICE ELIGIBILITY/DIAGNOSIS: Yes/metastatic renal cancer  PAST MEDICAL HISTORY:  Past Medical History:  Diagnosis  Date  . 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 Age-related bone loss   . Erectile dysfunction   . GERD (gastroesophageal reflux disease)   . Hyperkalemia   . Hyperlipidemia   . Hypertension   . Other dietary vitamin B12 deficiency anemia   . Prostate cancer ([place])   . Urinary tract infection   . Vitamin D deficiency     SOCIAL HX:  Social History   Tobacco Use  . Smoking status: Former Smoker    Types: Cigarettes    Quit date: 1986    Years since quitting: 34.8  . Smokeless tobacco: Never Used  Substance Use Topics  . Alcohol use: Yes    ALLERGIES:  Allergies  Allergen Reactions  . No Known Allergies      PERTINENT MEDICATIONS:  Outpatient Encounter Medications as of 11/21/2018  Medication Sig  . acetaminophen (TYLENOL) 325 MG tablet Take 2 tablets (650 mg total) by mouth every 6 (six) hours as needed for mild pain (or Fever >/= 101).  Marland Kitchen apixaban (ELIQUIS) 5 MG TABS tablet Take 2 tablets (10 mg total) by mouth 2 (two) times daily for 2 days.  Marland Kitchen apixaban (ELIQUIS) 5 MG TABS tablet Take 1 tablet (5 mg total) by mouth 2 (two) times daily.  Marland Kitchen aspirin EC 81 MG tablet Take 81 mg by mouth daily.  Marland Kitchen atorvastatin (LIPITOR) 40 MG tablet Take 40 mg by mouth daily.  Arna Medici 75 MCG tablet Take 75 mcg by mouth daily before breakfast.   . HYDROcodone-acetaminophen (NORCO/VICODIN) 5-325 MG tablet Take 1-2 tablets by mouth every 4 (four) hours as needed for moderate pain.  Marland Kitchen lactose free nutrition (BOOST) LIQD Take 237 mLs by mouth 2 (two) times daily as needed (nutrition).   . Multiple Vitamin (MULTIVITAMIN WITH MINERALS) TABS tablet Take 1 tablet by mouth daily.  . polyethylene glycol (MIRALAX / GLYCOLAX) 17 g packet Take 17 g by mouth daily as needed (for constipation).  . predniSONE (DELTASONE) 5 MG tablet Take 5 mg by mouth daily with breakfast.    No facility-administered encounter medications on file as of 11/21/2018.     PHYSICAL EXAM:  VS BP 134/84, HR 100,  RR 28 Ill appearing, elderly gentleman lying on his right side in bed. Mild SOB. Somnolent and lethargic.  His wife, 3 children and 1 young grandson are in attendance.  Lung fields with soft posterior bibasilar crackles. Cardiac RRR withou MRG. Tachy. LE mild swelling to mid calfs.   No rashes on exposed skin.  Neuro: Non-focal  Julianne Handler, NP

## 2018-11-24 ENCOUNTER — Telehealth: Payer: Self-pay | Admitting: Internal Medicine

## 2018-11-24 ENCOUNTER — Other Ambulatory Visit: Payer: Self-pay

## 2018-11-24 NOTE — Telephone Encounter (Signed)
Patient referred to hospice.  Dr. Nunzio Cobbs has agreed to act as attending physician under Hospice, and okayed activation of PRN Hospice orders. Initial admitting visit scheduled for 10:30am.  Violeta Gelinas NP-C [place] [phone]

## 2018-12-07 DEATH — deceased

## 2019-01-14 IMAGING — CT CT CHEST W/O CM
2 of 3 series · 15 of 36 positions shown, 18 images · non-contrast
Comparison: 08/02/2016

CLINICAL DATA: Shortness of breath, known left pleural effusion

EXAM:
CT CHEST WITHOUT CONTRAST
TECHNIQUE: Multidetector CT imaging of the chest was performed following the
standard protocol without IV contrast.

[Series 4: thorax 2.0 · axial · 0.81mm/px · z∈[-581,-301]mm · 12 of 166 slices shown, 15 images]
[im 13/166  mediastinal]
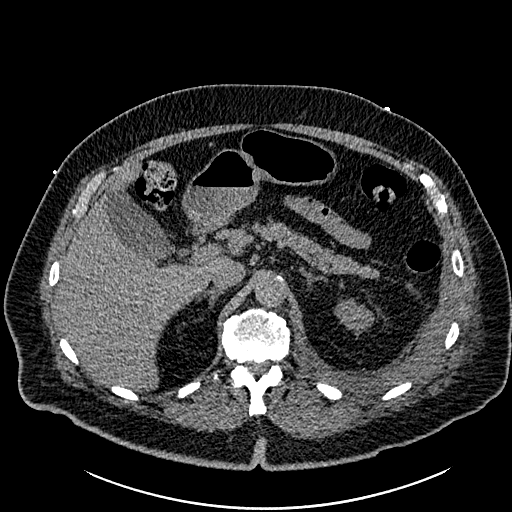
[im 13/166  lung]
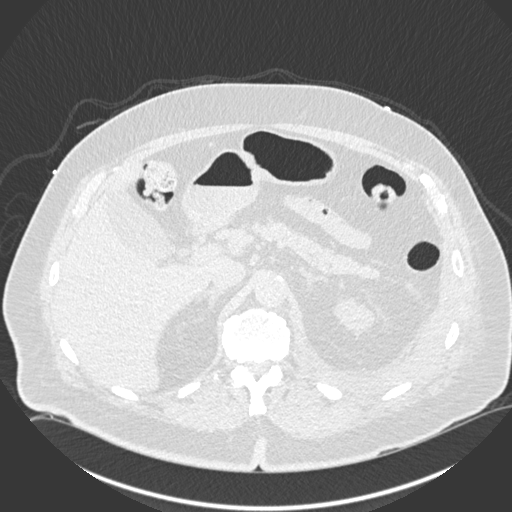
[im 25/166  lung]
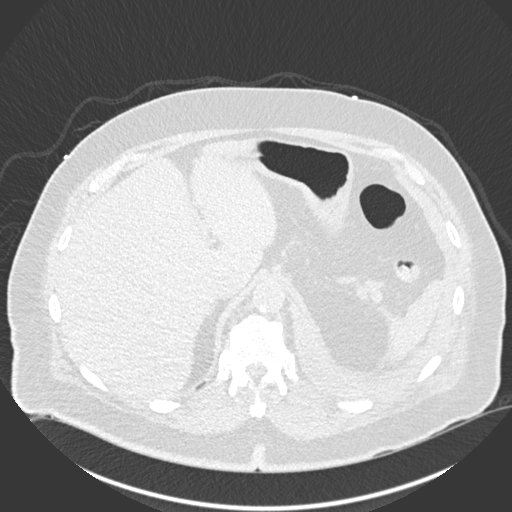
[im 37/166  lung]
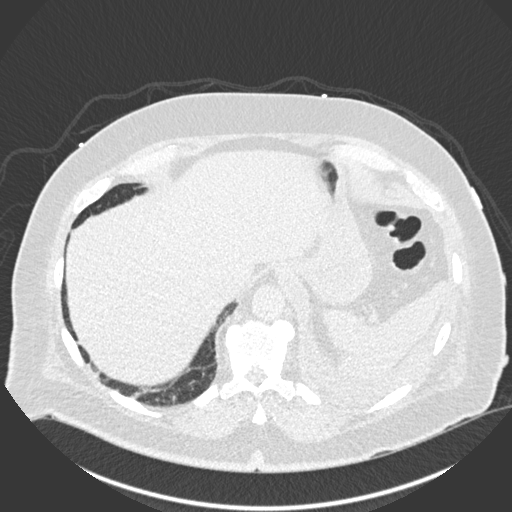
[im 49/166  lung]
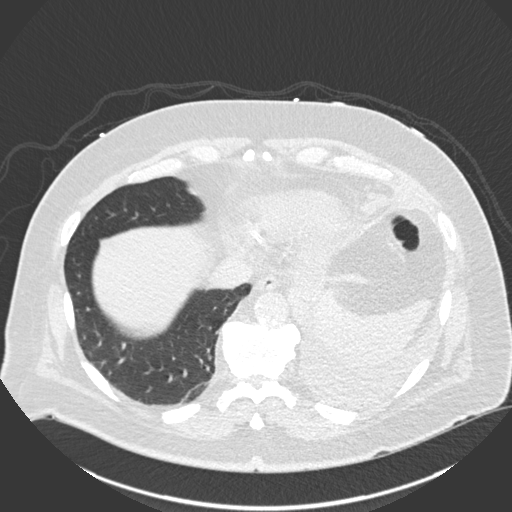
[im 62/166  mediastinal]
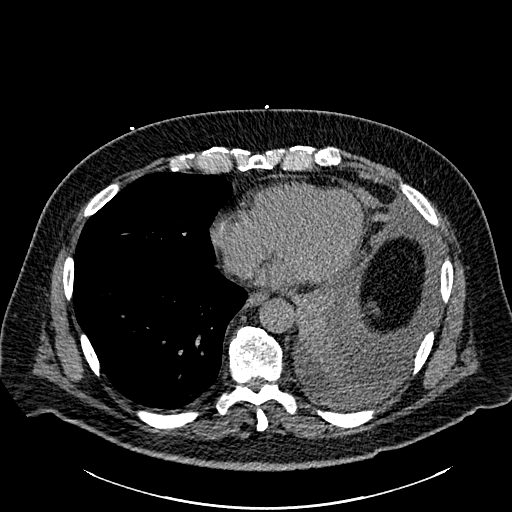
[im 62/166  lung]
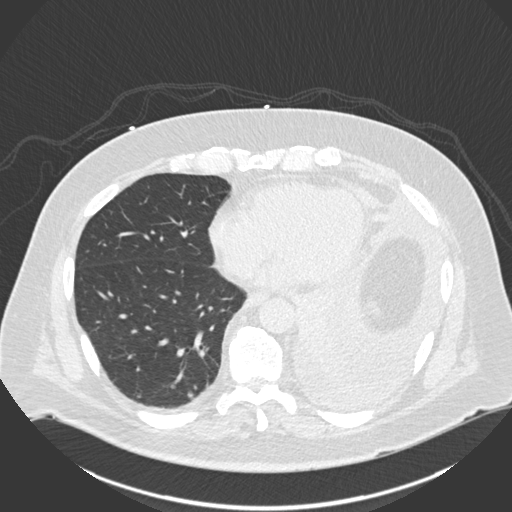
[im 74/166  lung]
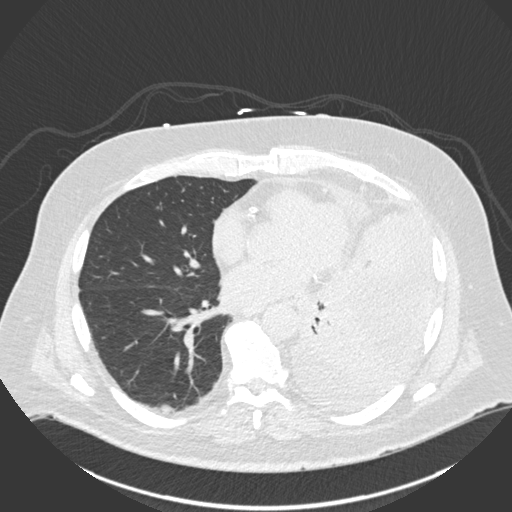
[im 92/166  lung]
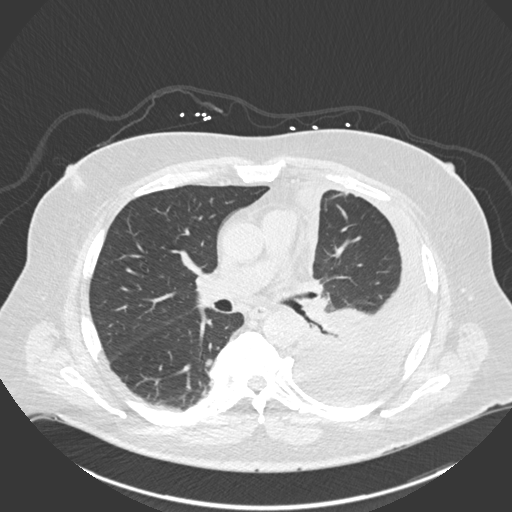
[im 104/166  lung]
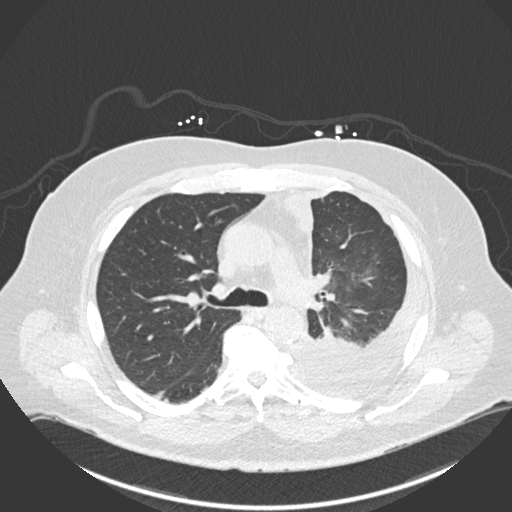
[im 117/166  mediastinal]
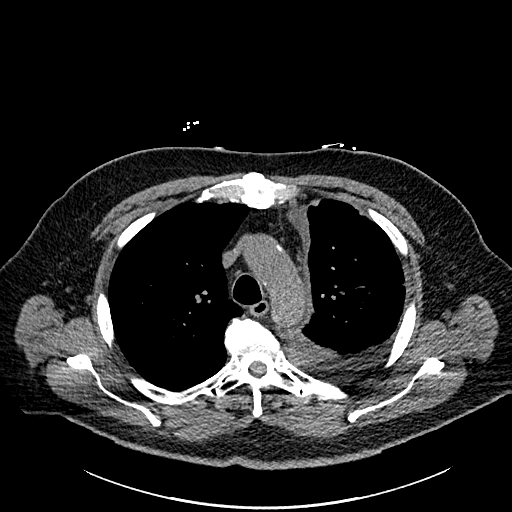
[im 117/166  lung]
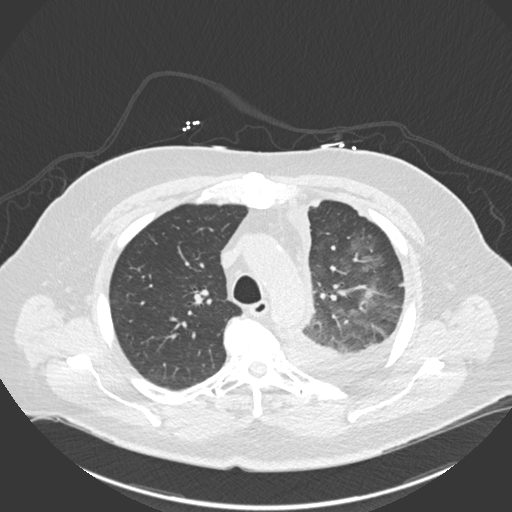
[im 129/166  lung]
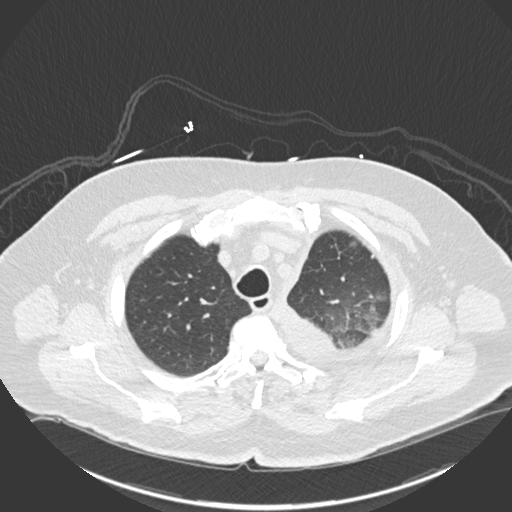
[im 141/166  lung]
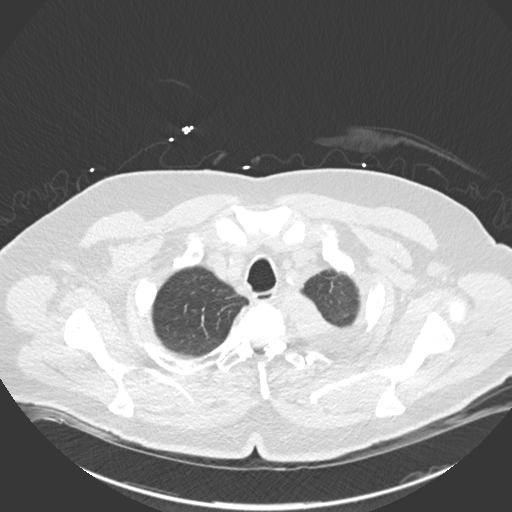
[im 153/166  lung]
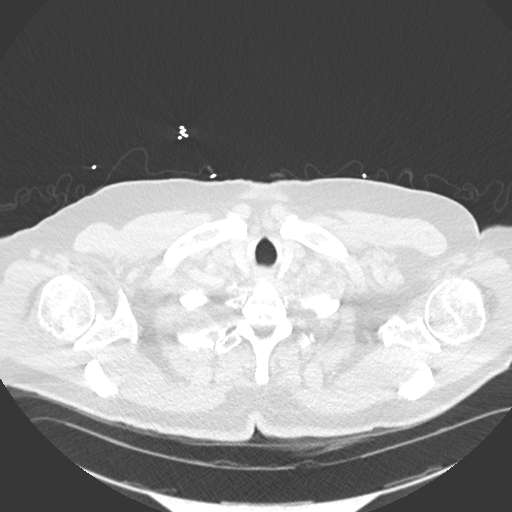

[Series 6: coronal · coronal · 0.65mm/px · 3 of 108 slices shown]
[im 22/108  lung]
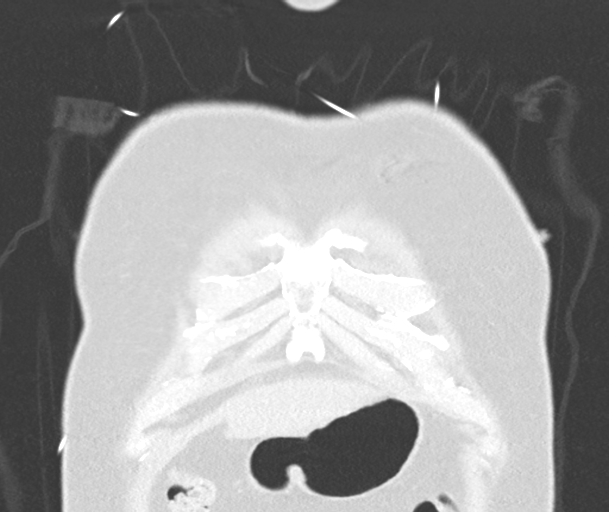
[im 43/108  lung]
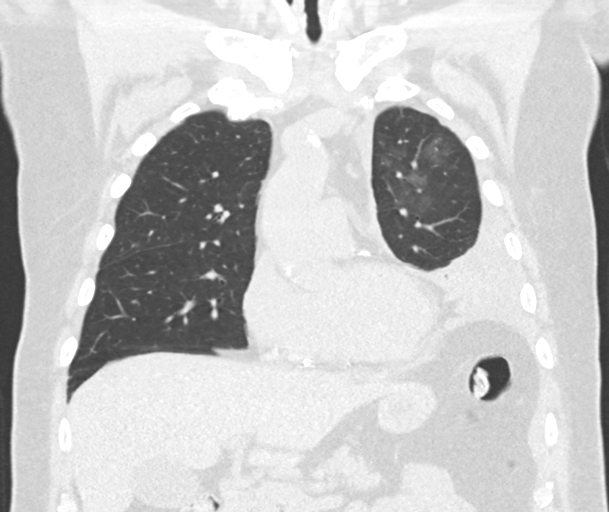
[im 65/108  lung]
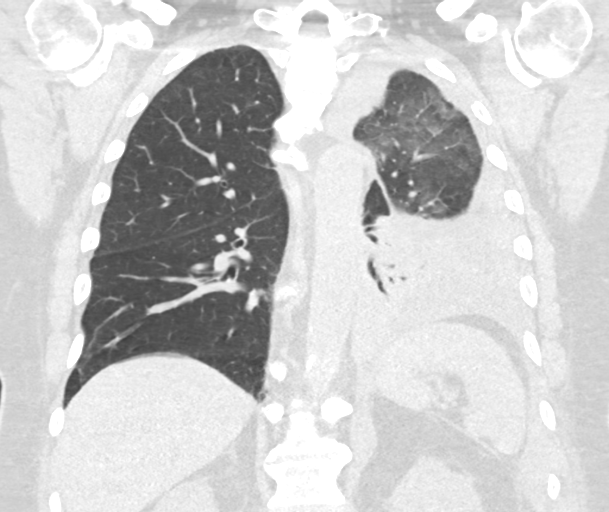

[15 of 36 positions shown; findings below may reference images not displayed]

FINDINGS: Cardiovascular: Somewhat limited due to the lack of IV contrast.
Aortic calcifications are noted. No aneurysmal dilatation is noted.
Coronary calcifications are seen. No significant cardiac enlargement
is noted.

Mediastinum/Nodes: The esophagus is within normal limits. Scattered
small mediastinal and hilar lymph nodes are seen. None of these are
significant by size criteria.

Lungs/Pleura: Moderate left pleural effusion remains similar to that
seen on prior plain film examination. Considerable consolidation
within the lower lobe is seen. Few small subpleural nodules are
noted measuring less than 5 mm. Some patchy changes are noted in the
left upper lobe which may be related to some re-expansion edema. A
pleural-based nodule measuring 11 mm is noted on the left on image
number 37 of series 8. The right lung shows some subpleural nodules.
The largest of these measures 11 mm in the right lower lobe on image
number 95 of series 8.

Upper Abdomen: No acute abnormality is noted in the upper abdomen.

Musculoskeletal: Degenerative changes of the thoracic spine are
seen.
IMPRESSION: Persistent moderate left pleural effusion with left lower lobe
consolidation. Some mild re-expansion edema is noted in the right
upper lobe. No pneumothorax is noted.

Multiple bilateral lung nodules. The largest of these measures 11 mm
bilaterally. Non-contrast chest CT at 3-6 months is recommended. If
the nodules are stable at time of repeat CT, then future CT at 18-24
months (from today's scan) is considered optional for low-risk
patients, but is recommended for high-risk patients. This
recommendation follows the consensus statement: Guidelines for
Management of Incidental Pulmonary Nodules Detected on CT Images:

These results will be called to the ordering clinician or
representative by the Radiologist Assistant, and communication
documented in the PACS or zVision Dashboard.

Aortic Atherosclerosis ([id number]).

## 2019-01-14 IMAGING — CR DG CHEST 2V
2 series · 2 of 2 positions shown · non-contrast
Comparison: Chest radiograph from one day prior.

CLINICAL DATA: Follow-up pleural effusion

EXAM:
CHEST  2 VIEW

[chest lat]
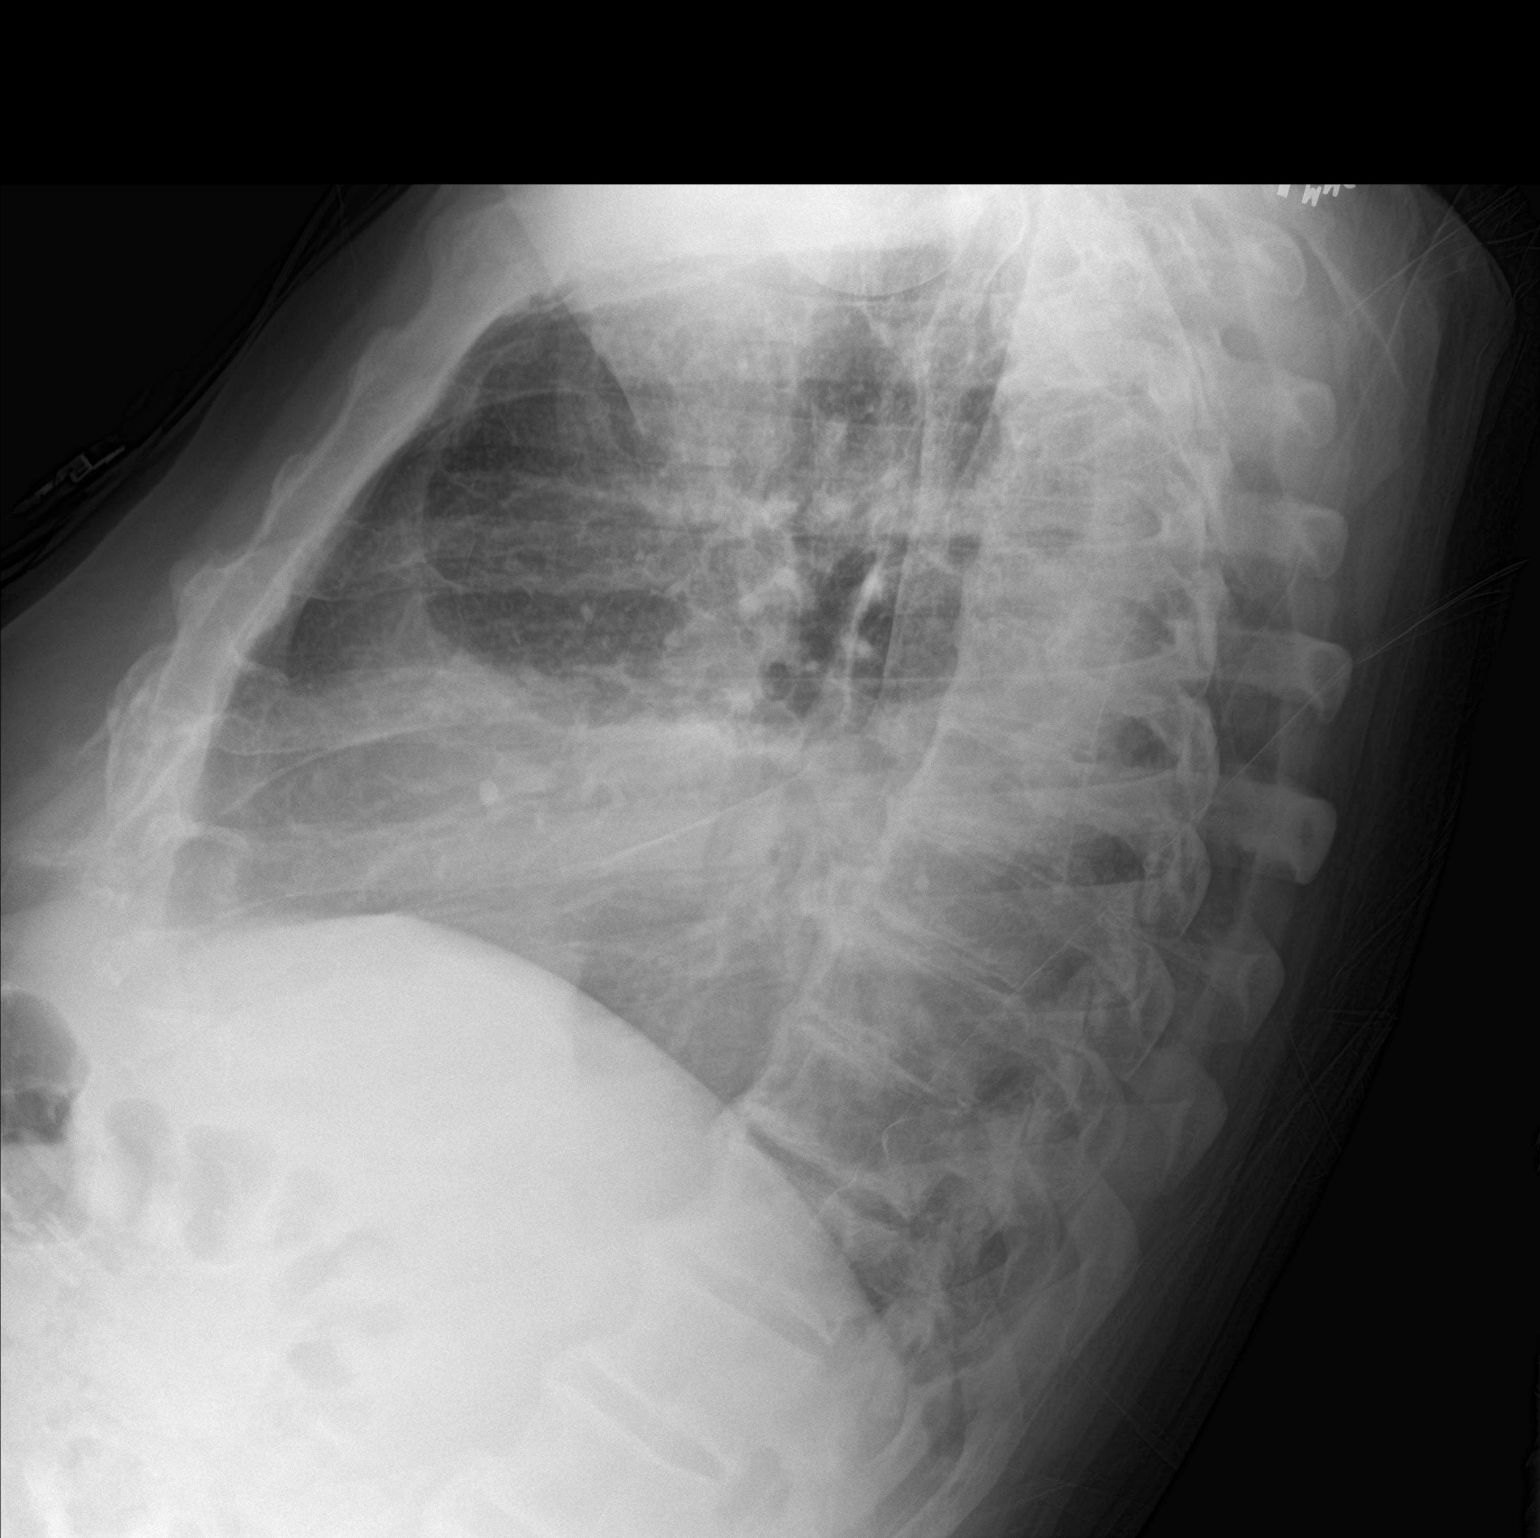

[chest ap]
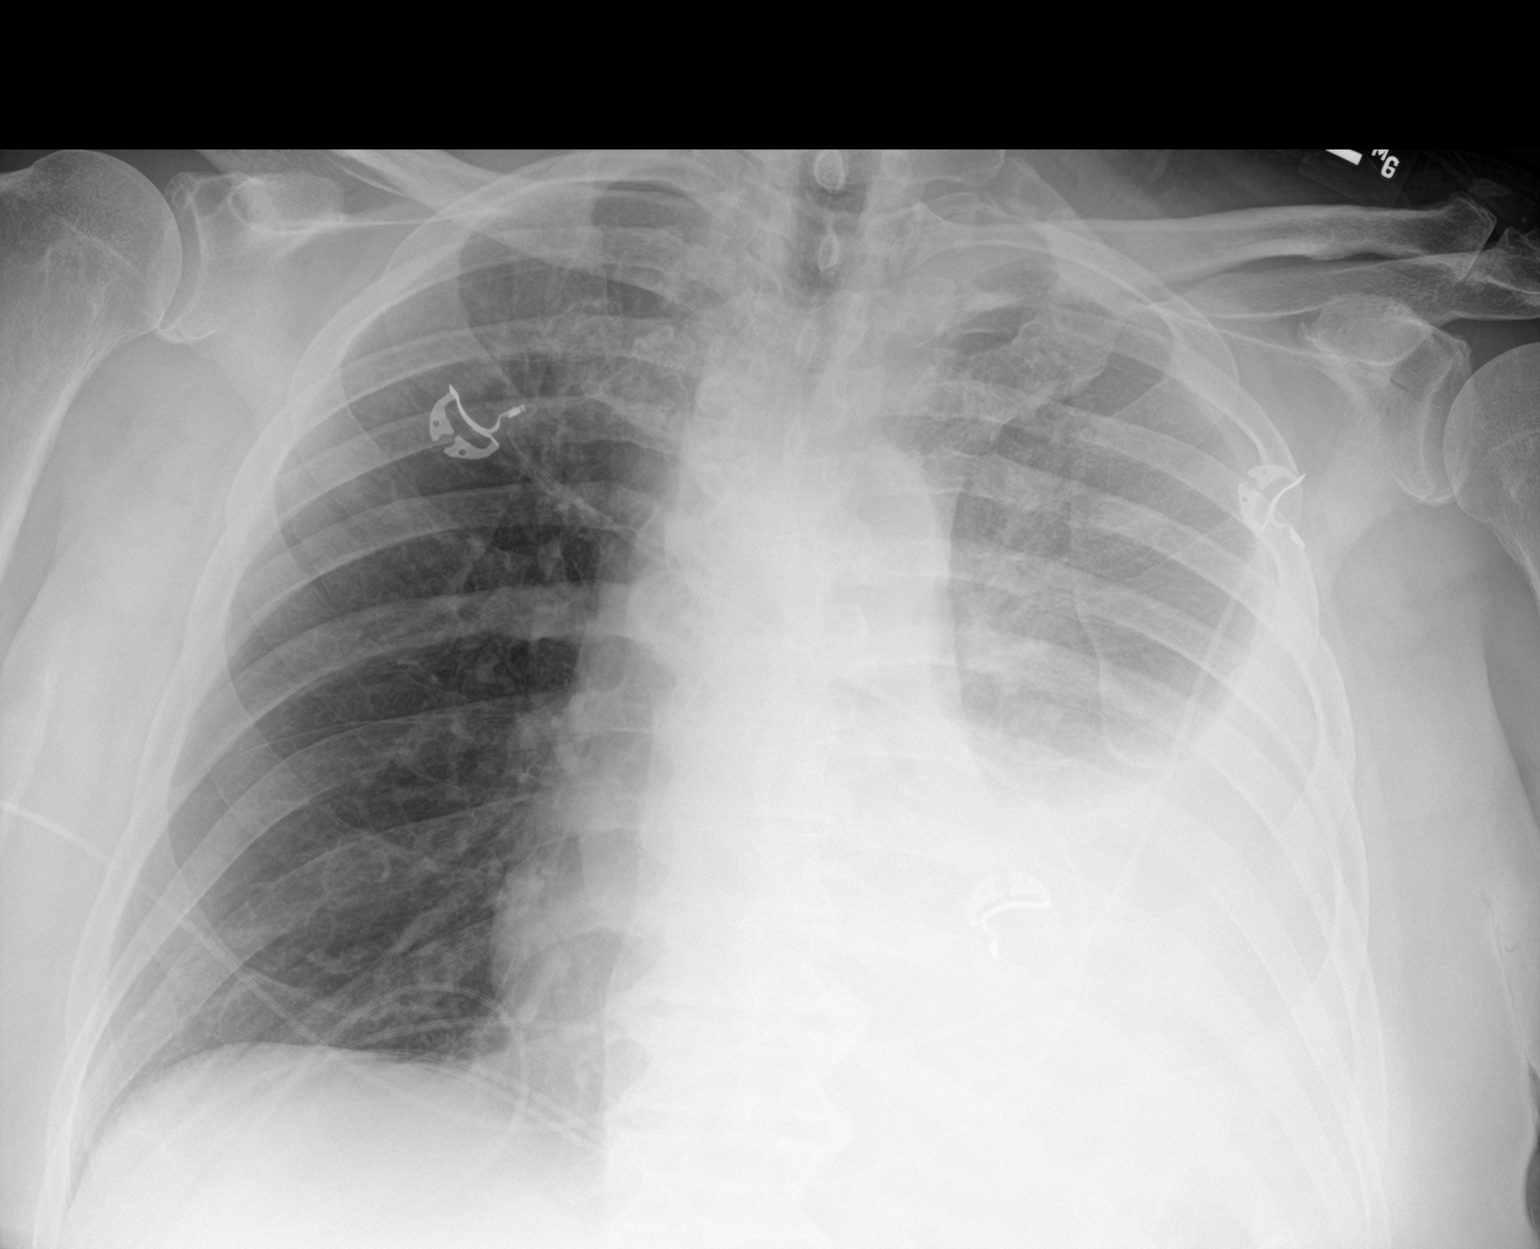

[2 of 2 positions shown; findings below may reference images not displayed]

FINDINGS: Stable cardiomediastinal silhouette with normal heart size. No
pneumothorax. Moderate to large left pleural effusion, slightly
decreased. No right pleural effusion. Stable left lung base
consolidation. No new focal consolidative airspace disease.
IMPRESSION: 1. Moderate to large left pleural effusion, slightly decreased .
2. Stable left lung base consolidation, favor atelectasis, recommend
follow-up to resolution.
